# Patient Record
Sex: Female | Born: 1975
Health system: Southern US, Community
[De-identification: ages and names within clinical notes are randomized; demographics above are authoritative.]

## PROBLEM LIST (undated history)

## (undated) DIAGNOSIS — J40 Bronchitis, not specified as acute or chronic: Secondary | ICD-10-CM

## (undated) DIAGNOSIS — Z8619 Personal history of other infectious and parasitic diseases: Secondary | ICD-10-CM

## (undated) DIAGNOSIS — D249 Benign neoplasm of unspecified breast: Secondary | ICD-10-CM

## (undated) DIAGNOSIS — N649 Disorder of breast, unspecified: Secondary | ICD-10-CM

## (undated) DIAGNOSIS — M1990 Unspecified osteoarthritis, unspecified site: Secondary | ICD-10-CM

## (undated) DIAGNOSIS — T7840XA Allergy, unspecified, initial encounter: Secondary | ICD-10-CM

## (undated) DIAGNOSIS — D219 Benign neoplasm of connective and other soft tissue, unspecified: Secondary | ICD-10-CM

## (undated) DIAGNOSIS — D649 Anemia, unspecified: Secondary | ICD-10-CM

## (undated) HISTORY — PX: BUNIONECTOMY: SHX129

## (undated) HISTORY — PX: TUBAL LIGATION: SHX77

## (undated) HISTORY — DX: Allergy, unspecified, initial encounter: T78.40XA

## (undated) HISTORY — DX: Anemia, unspecified: D64.9

## (undated) HISTORY — DX: Unspecified osteoarthritis, unspecified site: M19.90

## (undated) HISTORY — PX: DENTAL SURGERY: SHX609

---

## 1997-03-07 ENCOUNTER — Inpatient Hospital Stay (HOSPITAL_COMMUNITY): Admission: AD | Admit: 1997-03-07 | Discharge: 1997-03-07 | Payer: Self-pay | Admitting: Obstetrics & Gynecology

## 1997-03-28 ENCOUNTER — Inpatient Hospital Stay (HOSPITAL_COMMUNITY): Admission: AD | Admit: 1997-03-28 | Discharge: 1997-03-28 | Payer: Self-pay | Admitting: Obstetrics & Gynecology

## 1997-04-12 ENCOUNTER — Inpatient Hospital Stay (HOSPITAL_COMMUNITY): Admission: AD | Admit: 1997-04-12 | Discharge: 1997-04-12 | Payer: Self-pay | Admitting: *Deleted

## 1997-04-16 ENCOUNTER — Inpatient Hospital Stay (HOSPITAL_COMMUNITY): Admission: AD | Admit: 1997-04-16 | Discharge: 1997-04-16 | Payer: Self-pay | Admitting: Obstetrics

## 1997-04-22 ENCOUNTER — Other Ambulatory Visit: Admission: RE | Admit: 1997-04-22 | Discharge: 1997-04-22 | Payer: Self-pay | Admitting: Obstetrics

## 1997-05-04 ENCOUNTER — Inpatient Hospital Stay (HOSPITAL_COMMUNITY): Admission: AD | Admit: 1997-05-04 | Discharge: 1997-05-04 | Payer: Self-pay | Admitting: Obstetrics

## 1997-05-09 ENCOUNTER — Ambulatory Visit (HOSPITAL_COMMUNITY): Admission: RE | Admit: 1997-05-09 | Discharge: 1997-05-09 | Payer: Self-pay | Admitting: Obstetrics

## 1997-05-12 ENCOUNTER — Inpatient Hospital Stay (HOSPITAL_COMMUNITY): Admission: AD | Admit: 1997-05-12 | Discharge: 1997-05-12 | Payer: Self-pay | Admitting: Obstetrics

## 1997-05-13 ENCOUNTER — Inpatient Hospital Stay (HOSPITAL_COMMUNITY): Admission: AD | Admit: 1997-05-13 | Discharge: 1997-05-13 | Payer: Self-pay | Admitting: Obstetrics

## 1997-05-24 ENCOUNTER — Inpatient Hospital Stay (HOSPITAL_COMMUNITY): Admission: AD | Admit: 1997-05-24 | Discharge: 1997-05-24 | Payer: Self-pay | Admitting: Obstetrics

## 1997-06-07 ENCOUNTER — Inpatient Hospital Stay (HOSPITAL_COMMUNITY): Admission: AD | Admit: 1997-06-07 | Discharge: 1997-06-07 | Payer: Self-pay | Admitting: Obstetrics

## 1997-06-25 ENCOUNTER — Inpatient Hospital Stay (HOSPITAL_COMMUNITY): Admission: AD | Admit: 1997-06-25 | Discharge: 1997-06-25 | Payer: Self-pay | Admitting: Obstetrics

## 1997-07-02 ENCOUNTER — Inpatient Hospital Stay (HOSPITAL_COMMUNITY): Admission: AD | Admit: 1997-07-02 | Discharge: 1997-07-02 | Payer: Self-pay | Admitting: Obstetrics

## 1997-07-23 ENCOUNTER — Inpatient Hospital Stay (HOSPITAL_COMMUNITY): Admission: AD | Admit: 1997-07-23 | Discharge: 1997-07-23 | Payer: Self-pay | Admitting: Obstetrics

## 1997-07-26 ENCOUNTER — Inpatient Hospital Stay (HOSPITAL_COMMUNITY): Admission: AD | Admit: 1997-07-26 | Discharge: 1997-07-26 | Payer: Self-pay | Admitting: Obstetrics

## 1997-08-20 ENCOUNTER — Ambulatory Visit (HOSPITAL_COMMUNITY): Admission: RE | Admit: 1997-08-20 | Discharge: 1997-08-20 | Payer: Self-pay | Admitting: Obstetrics

## 1997-08-20 ENCOUNTER — Inpatient Hospital Stay (HOSPITAL_COMMUNITY): Admission: AD | Admit: 1997-08-20 | Discharge: 1997-08-20 | Payer: Self-pay | Admitting: Obstetrics

## 1997-08-27 ENCOUNTER — Inpatient Hospital Stay (HOSPITAL_COMMUNITY): Admission: AD | Admit: 1997-08-27 | Discharge: 1997-08-27 | Payer: Self-pay | Admitting: Obstetrics

## 1997-09-28 ENCOUNTER — Inpatient Hospital Stay (HOSPITAL_COMMUNITY): Admission: AD | Admit: 1997-09-28 | Discharge: 1997-09-28 | Payer: Self-pay | Admitting: Obstetrics

## 1997-10-05 ENCOUNTER — Inpatient Hospital Stay (HOSPITAL_COMMUNITY): Admission: AD | Admit: 1997-10-05 | Discharge: 1997-10-05 | Payer: Self-pay | Admitting: *Deleted

## 1997-10-06 ENCOUNTER — Inpatient Hospital Stay (HOSPITAL_COMMUNITY): Admission: AD | Admit: 1997-10-06 | Discharge: 1997-10-06 | Payer: Self-pay | Admitting: Obstetrics

## 1997-11-10 ENCOUNTER — Inpatient Hospital Stay (HOSPITAL_COMMUNITY): Admission: AD | Admit: 1997-11-10 | Discharge: 1997-11-10 | Payer: Self-pay | Admitting: Obstetrics

## 1997-11-23 ENCOUNTER — Inpatient Hospital Stay (HOSPITAL_COMMUNITY): Admission: AD | Admit: 1997-11-23 | Discharge: 1997-11-23 | Payer: Self-pay | Admitting: Obstetrics

## 1997-11-24 ENCOUNTER — Inpatient Hospital Stay (HOSPITAL_COMMUNITY): Admission: AD | Admit: 1997-11-24 | Discharge: 1997-11-26 | Payer: Self-pay | Admitting: Obstetrics

## 1998-03-11 ENCOUNTER — Inpatient Hospital Stay (HOSPITAL_COMMUNITY): Admission: AD | Admit: 1998-03-11 | Discharge: 1998-03-11 | Payer: Self-pay | Admitting: *Deleted

## 1998-05-22 ENCOUNTER — Emergency Department (HOSPITAL_COMMUNITY): Admission: EM | Admit: 1998-05-22 | Discharge: 1998-05-22 | Payer: Self-pay | Admitting: Family Medicine

## 1998-07-16 ENCOUNTER — Emergency Department (HOSPITAL_COMMUNITY): Admission: EM | Admit: 1998-07-16 | Discharge: 1998-07-16 | Payer: Self-pay | Admitting: Emergency Medicine

## 1998-09-27 ENCOUNTER — Emergency Department (HOSPITAL_COMMUNITY): Admission: EM | Admit: 1998-09-27 | Discharge: 1998-09-27 | Payer: Self-pay | Admitting: Emergency Medicine

## 1999-02-21 ENCOUNTER — Inpatient Hospital Stay (HOSPITAL_COMMUNITY): Admission: AD | Admit: 1999-02-21 | Discharge: 1999-02-21 | Payer: Self-pay | Admitting: Obstetrics

## 1999-02-21 ENCOUNTER — Encounter: Payer: Self-pay | Admitting: Obstetrics

## 1999-06-10 ENCOUNTER — Encounter: Admission: RE | Admit: 1999-06-10 | Discharge: 1999-06-10 | Payer: Self-pay | Admitting: Hematology and Oncology

## 1999-06-19 ENCOUNTER — Inpatient Hospital Stay (HOSPITAL_COMMUNITY): Admission: AD | Admit: 1999-06-19 | Discharge: 1999-06-19 | Payer: Self-pay | Admitting: Obstetrics

## 1999-07-13 ENCOUNTER — Encounter: Admission: RE | Admit: 1999-07-13 | Discharge: 1999-07-13 | Payer: Self-pay | Admitting: Internal Medicine

## 1999-10-05 ENCOUNTER — Inpatient Hospital Stay (HOSPITAL_COMMUNITY): Admission: EM | Admit: 1999-10-05 | Discharge: 1999-10-05 | Payer: Self-pay | Admitting: *Deleted

## 1999-11-06 ENCOUNTER — Encounter: Admission: RE | Admit: 1999-11-06 | Discharge: 1999-11-06 | Payer: Self-pay | Admitting: Internal Medicine

## 1999-11-07 ENCOUNTER — Inpatient Hospital Stay (HOSPITAL_COMMUNITY): Admission: AD | Admit: 1999-11-07 | Discharge: 1999-11-07 | Payer: Self-pay | Admitting: *Deleted

## 1999-11-12 ENCOUNTER — Encounter: Admission: RE | Admit: 1999-11-12 | Discharge: 1999-11-12 | Payer: Self-pay | Admitting: Internal Medicine

## 1999-12-17 ENCOUNTER — Emergency Department (HOSPITAL_COMMUNITY): Admission: EM | Admit: 1999-12-17 | Discharge: 1999-12-17 | Payer: Self-pay | Admitting: Emergency Medicine

## 2000-01-08 ENCOUNTER — Encounter: Payer: Self-pay | Admitting: Internal Medicine

## 2000-01-08 ENCOUNTER — Encounter: Admission: RE | Admit: 2000-01-08 | Discharge: 2000-01-08 | Payer: Self-pay | Admitting: Internal Medicine

## 2000-01-08 ENCOUNTER — Ambulatory Visit (HOSPITAL_COMMUNITY): Admission: RE | Admit: 2000-01-08 | Discharge: 2000-01-08 | Payer: Self-pay | Admitting: Internal Medicine

## 2000-01-22 ENCOUNTER — Encounter: Admission: RE | Admit: 2000-01-22 | Discharge: 2000-01-22 | Payer: Self-pay | Admitting: Internal Medicine

## 2000-03-31 ENCOUNTER — Inpatient Hospital Stay (HOSPITAL_COMMUNITY): Admission: AD | Admit: 2000-03-31 | Discharge: 2000-03-31 | Payer: Self-pay | Admitting: Obstetrics & Gynecology

## 2000-05-12 ENCOUNTER — Encounter: Admission: RE | Admit: 2000-05-12 | Discharge: 2000-05-12 | Payer: Self-pay | Admitting: Hematology and Oncology

## 2000-07-08 ENCOUNTER — Encounter: Admission: RE | Admit: 2000-07-08 | Discharge: 2000-07-08 | Payer: Self-pay | Admitting: Internal Medicine

## 2000-07-12 ENCOUNTER — Emergency Department (HOSPITAL_COMMUNITY): Admission: EM | Admit: 2000-07-12 | Discharge: 2000-07-12 | Payer: Self-pay | Admitting: Emergency Medicine

## 2000-07-21 ENCOUNTER — Other Ambulatory Visit: Admission: RE | Admit: 2000-07-21 | Discharge: 2000-07-21 | Payer: Self-pay | Admitting: Obstetrics

## 2000-07-21 ENCOUNTER — Encounter: Admission: RE | Admit: 2000-07-21 | Discharge: 2000-07-21 | Payer: Self-pay | Admitting: Internal Medicine

## 2000-08-31 ENCOUNTER — Encounter: Payer: Self-pay | Admitting: Obstetrics

## 2000-08-31 ENCOUNTER — Inpatient Hospital Stay (HOSPITAL_COMMUNITY): Admission: AD | Admit: 2000-08-31 | Discharge: 2000-08-31 | Payer: Self-pay | Admitting: Obstetrics

## 2000-09-20 ENCOUNTER — Inpatient Hospital Stay (HOSPITAL_COMMUNITY): Admission: AD | Admit: 2000-09-20 | Discharge: 2000-09-20 | Payer: Self-pay | Admitting: *Deleted

## 2000-09-21 ENCOUNTER — Emergency Department (HOSPITAL_COMMUNITY): Admission: EM | Admit: 2000-09-21 | Discharge: 2000-09-21 | Payer: Self-pay | Admitting: Emergency Medicine

## 2000-09-28 ENCOUNTER — Inpatient Hospital Stay (HOSPITAL_COMMUNITY): Admission: AD | Admit: 2000-09-28 | Discharge: 2000-09-28 | Payer: Self-pay | Admitting: Obstetrics

## 2000-09-30 ENCOUNTER — Encounter: Admission: RE | Admit: 2000-09-30 | Discharge: 2000-09-30 | Payer: Self-pay | Admitting: Internal Medicine

## 2000-10-28 ENCOUNTER — Emergency Department (HOSPITAL_COMMUNITY): Admission: EM | Admit: 2000-10-28 | Discharge: 2000-10-28 | Payer: Self-pay | Admitting: Emergency Medicine

## 2000-11-01 ENCOUNTER — Encounter: Admission: RE | Admit: 2000-11-01 | Discharge: 2000-11-01 | Payer: Self-pay | Admitting: Internal Medicine

## 2000-11-18 ENCOUNTER — Encounter: Admission: RE | Admit: 2000-11-18 | Discharge: 2000-11-18 | Payer: Self-pay | Admitting: Internal Medicine

## 2000-12-21 ENCOUNTER — Encounter: Admission: RE | Admit: 2000-12-21 | Discharge: 2000-12-21 | Payer: Self-pay

## 2001-01-06 ENCOUNTER — Inpatient Hospital Stay (HOSPITAL_COMMUNITY): Admission: AD | Admit: 2001-01-06 | Discharge: 2001-01-06 | Payer: Self-pay | Admitting: Obstetrics & Gynecology

## 2001-03-30 ENCOUNTER — Emergency Department (HOSPITAL_COMMUNITY): Admission: EM | Admit: 2001-03-30 | Discharge: 2001-03-30 | Payer: Self-pay | Admitting: Emergency Medicine

## 2001-04-13 ENCOUNTER — Emergency Department (HOSPITAL_COMMUNITY): Admission: EM | Admit: 2001-04-13 | Discharge: 2001-04-13 | Payer: Self-pay | Admitting: Emergency Medicine

## 2001-04-14 ENCOUNTER — Inpatient Hospital Stay (HOSPITAL_COMMUNITY): Admission: AD | Admit: 2001-04-14 | Discharge: 2001-04-14 | Payer: Self-pay | Admitting: *Deleted

## 2001-06-22 ENCOUNTER — Emergency Department (HOSPITAL_COMMUNITY): Admission: EM | Admit: 2001-06-22 | Discharge: 2001-06-22 | Payer: Self-pay | Admitting: *Deleted

## 2001-09-07 ENCOUNTER — Encounter: Admission: RE | Admit: 2001-09-07 | Discharge: 2001-09-07 | Payer: Self-pay | Admitting: Obstetrics and Gynecology

## 2001-09-07 ENCOUNTER — Other Ambulatory Visit: Admission: RE | Admit: 2001-09-07 | Discharge: 2001-09-07 | Payer: Self-pay | Admitting: Family Medicine

## 2001-11-03 ENCOUNTER — Ambulatory Visit (HOSPITAL_COMMUNITY): Admission: RE | Admit: 2001-11-03 | Discharge: 2001-11-03 | Payer: Self-pay | Admitting: Obstetrics

## 2001-11-03 ENCOUNTER — Encounter: Payer: Self-pay | Admitting: Obstetrics

## 2001-12-26 ENCOUNTER — Inpatient Hospital Stay (HOSPITAL_COMMUNITY): Admission: AD | Admit: 2001-12-26 | Discharge: 2001-12-26 | Payer: Self-pay | Admitting: *Deleted

## 2002-03-05 ENCOUNTER — Encounter: Admission: RE | Admit: 2002-03-05 | Discharge: 2002-03-05 | Payer: Self-pay | Admitting: Family Medicine

## 2002-03-05 ENCOUNTER — Encounter: Payer: Self-pay | Admitting: Family Medicine

## 2002-04-06 ENCOUNTER — Inpatient Hospital Stay (HOSPITAL_COMMUNITY): Admission: AD | Admit: 2002-04-06 | Discharge: 2002-04-06 | Payer: Self-pay | Admitting: Obstetrics & Gynecology

## 2002-04-07 ENCOUNTER — Inpatient Hospital Stay (HOSPITAL_COMMUNITY): Admission: AD | Admit: 2002-04-07 | Discharge: 2002-04-07 | Payer: Self-pay | Admitting: Obstetrics

## 2002-05-07 ENCOUNTER — Ambulatory Visit (HOSPITAL_COMMUNITY): Admission: RE | Admit: 2002-05-07 | Discharge: 2002-05-07 | Payer: Self-pay | Admitting: Obstetrics

## 2002-05-07 ENCOUNTER — Encounter: Payer: Self-pay | Admitting: Obstetrics

## 2002-06-07 ENCOUNTER — Inpatient Hospital Stay (HOSPITAL_COMMUNITY): Admission: AD | Admit: 2002-06-07 | Discharge: 2002-06-07 | Payer: Self-pay | Admitting: Obstetrics & Gynecology

## 2002-06-15 ENCOUNTER — Inpatient Hospital Stay (HOSPITAL_COMMUNITY): Admission: AD | Admit: 2002-06-15 | Discharge: 2002-06-15 | Payer: Self-pay | Admitting: Obstetrics

## 2002-07-02 ENCOUNTER — Inpatient Hospital Stay (HOSPITAL_COMMUNITY): Admission: AD | Admit: 2002-07-02 | Discharge: 2002-07-02 | Payer: Self-pay | Admitting: Obstetrics & Gynecology

## 2002-08-30 ENCOUNTER — Encounter (INDEPENDENT_AMBULATORY_CARE_PROVIDER_SITE_OTHER): Payer: Self-pay | Admitting: Specialist

## 2002-08-30 ENCOUNTER — Ambulatory Visit (HOSPITAL_COMMUNITY): Admission: RE | Admit: 2002-08-30 | Discharge: 2002-08-30 | Payer: Self-pay | Admitting: Obstetrics & Gynecology

## 2002-09-03 ENCOUNTER — Inpatient Hospital Stay (HOSPITAL_COMMUNITY): Admission: AD | Admit: 2002-09-03 | Discharge: 2002-09-03 | Payer: Self-pay | Admitting: Obstetrics & Gynecology

## 2002-09-15 ENCOUNTER — Inpatient Hospital Stay (HOSPITAL_COMMUNITY): Admission: AD | Admit: 2002-09-15 | Discharge: 2002-09-15 | Payer: Self-pay | Admitting: Obstetrics

## 2002-10-15 ENCOUNTER — Inpatient Hospital Stay (HOSPITAL_COMMUNITY): Admission: AD | Admit: 2002-10-15 | Discharge: 2002-10-15 | Payer: Self-pay | Admitting: Obstetrics

## 2002-12-05 ENCOUNTER — Emergency Department (HOSPITAL_COMMUNITY): Admission: EM | Admit: 2002-12-05 | Discharge: 2002-12-05 | Payer: Self-pay | Admitting: Emergency Medicine

## 2003-02-13 ENCOUNTER — Inpatient Hospital Stay (HOSPITAL_COMMUNITY): Admission: AD | Admit: 2003-02-13 | Discharge: 2003-02-13 | Payer: Self-pay | Admitting: Obstetrics and Gynecology

## 2003-02-26 ENCOUNTER — Emergency Department (HOSPITAL_COMMUNITY): Admission: EM | Admit: 2003-02-26 | Discharge: 2003-02-26 | Payer: Self-pay | Admitting: Emergency Medicine

## 2003-06-20 ENCOUNTER — Inpatient Hospital Stay (HOSPITAL_COMMUNITY): Admission: AD | Admit: 2003-06-20 | Discharge: 2003-06-20 | Payer: Self-pay | Admitting: Obstetrics & Gynecology

## 2003-11-05 ENCOUNTER — Inpatient Hospital Stay (HOSPITAL_COMMUNITY): Admission: AD | Admit: 2003-11-05 | Discharge: 2003-11-05 | Payer: Self-pay | Admitting: Obstetrics

## 2003-11-11 ENCOUNTER — Emergency Department (HOSPITAL_COMMUNITY): Admission: EM | Admit: 2003-11-11 | Discharge: 2003-11-11 | Payer: Self-pay | Admitting: Family Medicine

## 2003-11-14 ENCOUNTER — Emergency Department (HOSPITAL_COMMUNITY): Admission: EM | Admit: 2003-11-14 | Discharge: 2003-11-14 | Payer: Self-pay | Admitting: Emergency Medicine

## 2003-11-29 ENCOUNTER — Inpatient Hospital Stay (HOSPITAL_COMMUNITY): Admission: AD | Admit: 2003-11-29 | Discharge: 2003-11-29 | Payer: Self-pay | Admitting: Obstetrics

## 2004-01-08 ENCOUNTER — Emergency Department (HOSPITAL_COMMUNITY): Admission: EM | Admit: 2004-01-08 | Discharge: 2004-01-08 | Payer: Self-pay

## 2004-02-06 ENCOUNTER — Inpatient Hospital Stay (HOSPITAL_COMMUNITY): Admission: AD | Admit: 2004-02-06 | Discharge: 2004-02-06 | Payer: Self-pay | Admitting: Obstetrics & Gynecology

## 2004-02-10 ENCOUNTER — Emergency Department (HOSPITAL_COMMUNITY): Admission: EM | Admit: 2004-02-10 | Discharge: 2004-02-10 | Payer: Self-pay | Admitting: Emergency Medicine

## 2004-02-18 ENCOUNTER — Ambulatory Visit (HOSPITAL_COMMUNITY): Admission: RE | Admit: 2004-02-18 | Discharge: 2004-02-18 | Payer: Self-pay | Admitting: Obstetrics & Gynecology

## 2004-03-23 ENCOUNTER — Emergency Department (HOSPITAL_COMMUNITY): Admission: EM | Admit: 2004-03-23 | Discharge: 2004-03-23 | Payer: Self-pay | Admitting: Emergency Medicine

## 2004-04-25 ENCOUNTER — Inpatient Hospital Stay (HOSPITAL_COMMUNITY): Admission: AD | Admit: 2004-04-25 | Discharge: 2004-04-25 | Payer: Self-pay | Admitting: Obstetrics & Gynecology

## 2004-04-27 ENCOUNTER — Emergency Department (HOSPITAL_COMMUNITY): Admission: EM | Admit: 2004-04-27 | Discharge: 2004-04-27 | Payer: Self-pay | Admitting: Emergency Medicine

## 2004-05-02 ENCOUNTER — Inpatient Hospital Stay (HOSPITAL_COMMUNITY): Admission: AD | Admit: 2004-05-02 | Discharge: 2004-05-02 | Payer: Self-pay | Admitting: Obstetrics & Gynecology

## 2004-06-14 ENCOUNTER — Emergency Department (HOSPITAL_COMMUNITY): Admission: EM | Admit: 2004-06-14 | Discharge: 2004-06-14 | Payer: Self-pay | Admitting: Emergency Medicine

## 2004-07-28 ENCOUNTER — Inpatient Hospital Stay (HOSPITAL_COMMUNITY): Admission: AD | Admit: 2004-07-28 | Discharge: 2004-07-28 | Payer: Self-pay | Admitting: Obstetrics

## 2004-10-02 ENCOUNTER — Emergency Department (HOSPITAL_COMMUNITY): Admission: EM | Admit: 2004-10-02 | Discharge: 2004-10-02 | Payer: Self-pay | Admitting: Emergency Medicine

## 2004-10-21 ENCOUNTER — Emergency Department (HOSPITAL_COMMUNITY): Admission: EM | Admit: 2004-10-21 | Discharge: 2004-10-21 | Payer: Self-pay | Admitting: Emergency Medicine

## 2005-02-13 ENCOUNTER — Inpatient Hospital Stay (HOSPITAL_COMMUNITY): Admission: AD | Admit: 2005-02-13 | Discharge: 2005-02-13 | Payer: Self-pay | Admitting: Obstetrics & Gynecology

## 2005-05-02 ENCOUNTER — Emergency Department (HOSPITAL_COMMUNITY): Admission: EM | Admit: 2005-05-02 | Discharge: 2005-05-03 | Payer: Self-pay | Admitting: Emergency Medicine

## 2005-05-04 ENCOUNTER — Emergency Department (HOSPITAL_COMMUNITY): Admission: EM | Admit: 2005-05-04 | Discharge: 2005-05-04 | Payer: Self-pay | Admitting: Family Medicine

## 2005-07-15 ENCOUNTER — Inpatient Hospital Stay (HOSPITAL_COMMUNITY): Admission: AD | Admit: 2005-07-15 | Discharge: 2005-07-15 | Payer: Self-pay | Admitting: Obstetrics & Gynecology

## 2005-10-12 ENCOUNTER — Inpatient Hospital Stay (HOSPITAL_COMMUNITY): Admission: AD | Admit: 2005-10-12 | Discharge: 2005-10-12 | Payer: Self-pay | Admitting: Obstetrics

## 2005-10-20 ENCOUNTER — Inpatient Hospital Stay (HOSPITAL_COMMUNITY): Admission: AD | Admit: 2005-10-20 | Discharge: 2005-10-20 | Payer: Self-pay | Admitting: Obstetrics

## 2006-01-09 ENCOUNTER — Emergency Department (HOSPITAL_COMMUNITY): Admission: AD | Admit: 2006-01-09 | Discharge: 2006-01-09 | Payer: Self-pay | Admitting: Family Medicine

## 2006-03-16 ENCOUNTER — Emergency Department (HOSPITAL_COMMUNITY): Admission: EM | Admit: 2006-03-16 | Discharge: 2006-03-16 | Payer: Self-pay | Admitting: Emergency Medicine

## 2006-04-26 ENCOUNTER — Inpatient Hospital Stay (HOSPITAL_COMMUNITY): Admission: AD | Admit: 2006-04-26 | Discharge: 2006-04-26 | Payer: Self-pay | Admitting: Obstetrics

## 2006-08-04 ENCOUNTER — Other Ambulatory Visit: Admission: RE | Admit: 2006-08-04 | Discharge: 2006-08-04 | Payer: Self-pay | Admitting: Gynecology

## 2006-08-29 ENCOUNTER — Emergency Department (HOSPITAL_COMMUNITY): Admission: EM | Admit: 2006-08-29 | Discharge: 2006-08-29 | Payer: Self-pay | Admitting: Emergency Medicine

## 2006-09-17 ENCOUNTER — Inpatient Hospital Stay (HOSPITAL_COMMUNITY): Admission: AD | Admit: 2006-09-17 | Discharge: 2006-09-17 | Payer: Self-pay | Admitting: Gynecology

## 2007-01-11 ENCOUNTER — Inpatient Hospital Stay (HOSPITAL_COMMUNITY): Admission: AD | Admit: 2007-01-11 | Discharge: 2007-01-11 | Payer: Self-pay | Admitting: Gynecology

## 2007-05-27 ENCOUNTER — Emergency Department (HOSPITAL_COMMUNITY): Admission: EM | Admit: 2007-05-27 | Discharge: 2007-05-27 | Payer: Self-pay | Admitting: Emergency Medicine

## 2007-07-24 ENCOUNTER — Inpatient Hospital Stay (HOSPITAL_COMMUNITY): Admission: AD | Admit: 2007-07-24 | Discharge: 2007-07-24 | Payer: Self-pay | Admitting: Gynecology

## 2007-10-01 ENCOUNTER — Inpatient Hospital Stay (HOSPITAL_COMMUNITY): Admission: AD | Admit: 2007-10-01 | Discharge: 2007-10-01 | Payer: Self-pay | Admitting: Gynecology

## 2008-01-31 ENCOUNTER — Ambulatory Visit: Payer: Self-pay | Admitting: Gynecology

## 2008-02-09 ENCOUNTER — Ambulatory Visit: Payer: Self-pay | Admitting: Gynecology

## 2008-06-07 ENCOUNTER — Emergency Department (HOSPITAL_COMMUNITY): Admission: EM | Admit: 2008-06-07 | Discharge: 2008-06-07 | Payer: Self-pay | Admitting: Family Medicine

## 2008-07-02 ENCOUNTER — Emergency Department (HOSPITAL_COMMUNITY): Admission: EM | Admit: 2008-07-02 | Discharge: 2008-07-02 | Payer: Self-pay | Admitting: Emergency Medicine

## 2008-07-06 ENCOUNTER — Emergency Department (HOSPITAL_COMMUNITY): Admission: EM | Admit: 2008-07-06 | Discharge: 2008-07-06 | Payer: Self-pay | Admitting: Emergency Medicine

## 2008-08-25 ENCOUNTER — Inpatient Hospital Stay (HOSPITAL_COMMUNITY): Admission: AD | Admit: 2008-08-25 | Discharge: 2008-08-25 | Payer: Self-pay | Admitting: Gynecology

## 2008-09-06 ENCOUNTER — Encounter: Admission: RE | Admit: 2008-09-06 | Discharge: 2008-09-06 | Payer: Self-pay | Admitting: Gastroenterology

## 2008-09-16 ENCOUNTER — Ambulatory Visit (HOSPITAL_COMMUNITY): Admission: RE | Admit: 2008-09-16 | Discharge: 2008-09-16 | Payer: Self-pay | Admitting: Gastroenterology

## 2009-02-24 ENCOUNTER — Emergency Department (HOSPITAL_COMMUNITY): Admission: EM | Admit: 2009-02-24 | Discharge: 2009-02-24 | Payer: Self-pay | Admitting: Emergency Medicine

## 2009-03-23 ENCOUNTER — Emergency Department (HOSPITAL_COMMUNITY): Admission: EM | Admit: 2009-03-23 | Discharge: 2009-03-23 | Payer: Self-pay | Admitting: Family Medicine

## 2009-05-10 ENCOUNTER — Inpatient Hospital Stay (HOSPITAL_COMMUNITY): Admission: AD | Admit: 2009-05-10 | Discharge: 2009-05-10 | Payer: Self-pay | Admitting: Obstetrics and Gynecology

## 2009-06-22 ENCOUNTER — Emergency Department (HOSPITAL_COMMUNITY): Admission: EM | Admit: 2009-06-22 | Discharge: 2009-06-22 | Payer: Self-pay | Admitting: Family Medicine

## 2009-09-16 ENCOUNTER — Emergency Department (HOSPITAL_COMMUNITY): Admission: EM | Admit: 2009-09-16 | Discharge: 2009-09-16 | Payer: Self-pay | Admitting: Family Medicine

## 2010-02-21 ENCOUNTER — Emergency Department (HOSPITAL_COMMUNITY)
Admission: EM | Admit: 2010-02-21 | Discharge: 2010-02-21 | Payer: Self-pay | Source: Home / Self Care | Admitting: Family Medicine

## 2010-04-22 LAB — URINALYSIS, ROUTINE W REFLEX MICROSCOPIC
Bilirubin Urine: NEGATIVE
Glucose, UA: NEGATIVE mg/dL
Ketones, ur: NEGATIVE mg/dL
Nitrite: POSITIVE — AB
Specific Gravity, Urine: 1.015 (ref 1.005–1.030)
pH: 6 (ref 5.0–8.0)

## 2010-04-22 LAB — URINE MICROSCOPIC-ADD ON

## 2010-04-22 LAB — POCT PREGNANCY, URINE: Preg Test, Ur: NEGATIVE

## 2010-05-10 LAB — URINE CULTURE: Colony Count: >=100000

## 2010-05-10 LAB — GC/CHLAMYDIA PROBE AMP, GENITAL: GC Probe Amp, Genital: NEGATIVE

## 2010-05-10 LAB — URINE MICROSCOPIC-ADD ON

## 2010-05-10 LAB — URINALYSIS, ROUTINE W REFLEX MICROSCOPIC
Glucose, UA: NEGATIVE mg/dL
Ketones, ur: NEGATIVE mg/dL
Leukocytes, UA: NEGATIVE
Nitrite: NEGATIVE
Protein, ur: >300 mg/dL — AB
pH: 6 (ref 5.0–8.0)

## 2010-05-10 LAB — WET PREP, GENITAL

## 2010-06-16 NOTE — Consult Note (Signed)
Julia Shaw, Julia Shaw          ACCOUNT NO.:  [id number]   MEDICAL RECORD NO.:  [id number]          PATIENT TYPE:  MAT   LOCATION:  [facility]                          [facility]ILITY:  [facility]   PHYSICIAN:  M. Leda Quail, MD  DATE OF BIRTH:  1975-06-11   DATE OF CONSULTATION:  DATE OF DISCHARGE:                                 CONSULTATION   CHIEF COMPLAINT:  Female problems.   HISTORY OF PRESENT ILLNESS:  The patient is a 35 year old G3, P2, A1,  single, African American female presents with a 2-day history of vaginal  discharge with some odor.  She has some mild itching.  She has had  something like this before which was a yeast infection.  She is not sure  if this is the same thing or different.  She had not called Dr. Audie Box  this week at all.  She is having no urinary symptoms.  No fever.  No  back pain.  No nausea, vomiting, diarrhea, or constipation.   PAST MEDICAL HISTORY:  Small fibroid uterus.   PAST SURGICAL HISTORY:  1. Laparoscopic left salpingectomy done by Dr. Roseanna Rainbow      for what sounds like hydrosalpinx 1-2 years ago.  2. D&C x1 for a missed AB.  3. Bilateral tubal ligation.   GYNECOLOGIC HISTORY:  She has slightly irregular cycles.  They do occur  monthly but she does have some irregularity to her bleeding where she  will bleed for a few days, stop, and ten have some brownish discharge  for several more days.  She has seen Dr. Audie Box for this and undergone  an ultrasound for workup.  She has not decided what she wants to do for  treatment.   ALLERGIES:  No known drug allergies.   SOCIAL HISTORY:  Single.  She lives with her kids who are with her  grandmother at the moment.  She works at [employer].  She has occasional  alcohol use but she denies any drugs or alcohol.   REVIEW OF SYSTEMS:  Pertinent positives are as per above.  The patient  states that she is not sexually active, although she did tell the nurse  she had been sexually active about a  week ago.   PHYSICAL EXAMINATION:  VITAL SIGNS:  Temp 98.4, BP 101/66, pulse 85,  respirations 16.  GENERAL:  She is a slightly obese [place] American female who is well  nourished and well developed in no acute distress.  CARDIOVASCULAR:  Regular rate and rhythm without murmurs, rubs, or  gallops.  LUNGS:  Clear to auscultation bilaterally with good respiratory effort.  FLANK:  No CVA tenderness.  ABDOMEN:  Soft, nontender, nondistended.  She does have a protuberant  abdomen.  Normal bowel sounds.  No masses, hernias, or  hepatosplenomegaly.  She has no guarding or rebound.  LYMPH:  No inguinal adenopathy.  GYN:  Normal-appearing external female genitalia.  She has got some  frothy discharge present.  The vaginal tissue is slightly erythematous.  The cervix is parous.  She does have a secondary cystocele.  The urethra  meatus are otherwise normal.  Bladder is  nontender to palpation.  A wet  smear is obtained and GC and Chlamydia cultures are obtained.  On  bimanual exam she has an anteflexed uterus.  There is no cervical motion  tenderness.  The adnexa are not palpable due to her abdomen.  EXTREMITIES:  No clubbing, cyanosis, or edema.  She does have some  scarring on her left arm which she says is burns from work.   ASSESSMENT/PLAN:  43. A 35 year old gravida 3, para 2, abortion 1, African American      female with vaginal discharge most consistent with BV or      Trichomonas.  She will be treated pending her wet smear.  2. GC and Chlamydia cultures will be followed.  3. She should follow up with Dr. Audie Box next week if she has any      continued problems.      Lum Keas, MD  Electronically Signed     MSM/MEDQ  D:  09/17/2006  T:  09/18/2006  Job:  [id number]   cc:   Marcial Pacas P. Fontaine, M.D.  Fax: [phone]

## 2010-06-19 NOTE — H&P (Signed)
NAME:  Julia Shaw, HECKART NO.:  [id number]   MEDICAL RECORD NO.:  [id number]                   PATIENT TYPE:  MAT   LOCATION:  [facility]                                 [facility]   PHYSICIAN:  Roseanna Rainbow, M.D.         DATE OF BIRTH:  1975/06/04   DATE OF ADMISSION:  07/02/2002  DATE OF DISCHARGE:  07/02/2002                                HISTORY & PHYSICAL   CHIEF COMPLAINT:  The patient is a 35 year old, para 2, African American  female with a likely left sided hydrosalpinx and pain who presents for  operative laparoscopy with left salpingectomy.   HISTORY OF PRESENT ILLNESS:  As above.  The patient has a long history of  pelvic pain with what she describes as lower abdominal suprapubic cramping  primarily cyclic in nature but occasionally she has the pain unassociated  with menses.  Serial ultrasounds for the past two years have demonstrated a  stable left sided hydrosalpinx that is approximately 6 cm in diameter.   PAST OB/GYN HISTORY:  1. She has been pregnant three times, has had two vaginal deliveries and one     miscarriage.  2. She is also status post a bilateral tubal ligation.  3. She denies any history of any sexually transmitted diseases.  4. Pap smear from February demonstrated atypical cells and atypical squamous     cells and high risk HPV was positive.   PAST MEDICAL HISTORY:  She denies.   PAST SURGICAL HISTORY:  As above.   MEDICATIONS:  Include Ultram.   ALLERGIES:  No known drug allergies.   SOCIAL HISTORY:  She is single.  She denies any tobacco, ethanol or  substance abuse.   FAMILY HISTORY:  Remarkable for diabetes.   PHYSICAL EXAMINATION:  VITAL SIGNS:  Temperature is 97.5, pulse 88, blood  pressure 115/70, weight 174 pounds, height 5 foot 3 inches.  GENERAL:  Slightly overweight.  No apparent distress.  LUNGS:  Clear to auscultation bilaterally.  HEART:  Regular rate and rhythm.  ABDOMEN:  Soft,  nontender.  PELVIC:  The external female genitalia normal appearing.  Speculum exam, the  vagina is clean.  Bimanual exam the uterus is retroverted,  nontender,  normal size.  There is a fullness in the left adnexa, nontender.  The right  adnexa is nontender.  No masses.  EXTREMITIES:  Lower extremities no clubbing, cyanosis or edema.  SKIN:  Without rashes.   ASSESSMENT:  Left hydrosalpinx with pelvic pain.   PLAN:  Operative laparoscopy with likely a left salpingectomy possible  laparotomy.  The risks, benefits and alternative forms of management were  reviewed with the patient and informed consent had been obtained.  Roseanna Rainbow, M.D.    Judee Clara  D:  07/06/2002  T:  07/06/2002  Job:  [id number]

## 2010-06-19 NOTE — Op Note (Signed)
Julia Shaw, Julia Shaw                      ACCOUNT NO.:  [id number]   MEDICAL RECORD NO.:  [id number]                   PATIENT TYPE:  AMB   LOCATION:  [facility]                                  [facility]   PHYSICIAN:  Roseanna Rainbow, M.D.         DATE OF BIRTH:  [birth year]   DATE OF PROCEDURE:  08/30/2002  DATE OF DISCHARGE:                                 OPERATIVE REPORT   PREOPERATIVE DIAGNOSIS:  Left hydrosalpinx.   POSTOPERATIVE DIAGNOSIS:  Left hydrosalpinx.   PROCEDURE:  Operative laparoscopy with left salpingectomy.   SURGEON:  Roseanna Rainbow, M.D.  Charles A. Clearance Coots, M.D.   ANESTHESIA:  General endotracheal anesthesia.   COMPLICATIONS:  None.   ESTIMATED BLOOD LOSS:  Less than 50 mL.   FLUIDS REPLACED:  As per anesthesiology.   URINE OUTPUT:  As per anesthesiology.   FINDINGS:  Normal uterus and ovaries.  The right fallopian tube distally  appeared normal.  The distal aspect of the left ovary was markedly dilated.   PROCEDURE:  The patient was taken to the operating room where general  anesthesia was obtained without difficulty.  She was then placed in the  dorsal lithotomy position and prepped and draped in the sterile fashion.  A  bivalve speculum was placed in the vagina and the anterior lip of the cervix  grasped with a single tooth tenaculum.  The Hulka uterine manipulator was  advanced into the uterus and secured to the anterior lip of the cervix as a  means to manipulate the uterus.  The specimen was removed from the vagina.   Attention was turned to the abdomen where a 10 mm skin incision was made in  the umbilical fold.  The Veress needle was carefully introduced into the  peritoneal cavity at a 45 degree angle while tenting the abdominal wall.  Intraperitoneal placement was confirmed by use of a water filled syringe and  a drop of intra-abdominal pressure with insufflation of CO2 gas.  The trocar  and sleeve were then advanced  without difficulty into the abdomen where  intra-abdominal placement was confirmed by the laparoscope.  A second  incision was made in the right lower quadrant and a second trocar and sleeve  were advanced under direct visualization.  A third incision was made in the  left lower quadrant and the third trocar and sleeve were advanced under  direct visualization.  A fourth skin incision was made approximately 2 cm  above the symphysis pubis in the midline and a fourth trocar and sleeve were  advanced under direct visualization.  Survey of the patient's pelvis and  abdomen revealed the above findings.   The portion of the tube proximal to the dilated portion was then cauterized  and severed.  The remaining mesosalpinx and fimbria were then cauterized and  severed with endoshears.  The tube was placed into an endocatch and removed  from the abdomen.  The suprapubic fascial incision  was then repaired with 0  Vicryl under direct visualization.  The instruments were removed from the  patient's abdomen and the incisions repaired with 4-0 Vicryl and Dermabond.  The Hulka manipulator was removed from the vagina with minimal bleeding  noted from the cervix.  The patient tolerated the procedure well.  Sponge,  lap, needle, and instrument counts were correct x 2.  The patient was taken  to the PACU in stable condition.                                               Roseanna Rainbow, M.D.    Judee Clara  D:  08/30/2002  T:  08/30/2002  Job:  [id number]

## 2010-06-19 NOTE — H&P (Signed)
NAME:  Julia Shaw, Julia Shaw NO.:  [id number]   MEDICAL RECORD NO.:  [id number]                   PATIENT TYPE:  AMB   LOCATION:  [facility]                                  [facility]   PHYSICIAN:  Roseanna Rainbow, M.D.         DATE OF BIRTH:  01-21-1976   DATE OF ADMISSION:  08/09/2002  DATE OF DISCHARGE:                                HISTORY & PHYSICAL   CHIEF COMPLAINT:  The patient is a 35 year old, para, 2 African American  female with likely left-sided hydrosalpinx and pain who presents for  operative laparoscopy with left salpingectomy.   HISTORY OF PRESENT ILLNESS:  As above. The patient has a long history of  pelvic pain, which she describes as lower abdominal in the suprapubic  region. She characterizes the pain as crampy and cyclic in nature.  Occasionally, she has pain unassociated with menses. Serial ultrasounds over  the past two years have demonstrated a stable left-sided hydrosalpinx that  is approximately 6 cm in diameter.   PAST OBSTETRIC/GYNECOLOGIC HISTORY:  She has been pregnant three times. She  had two vaginal deliveries and one miscarriage. She is status post bilateral  tubal ligation. She denies any history of any sexually transmitted diseases.  Pap smear from February demonstrated atypical squamous cells and there was  high risk HPV present.   PAST MEDICAL HISTORY:  She denies.   PAST SURGICAL HISTORY:  As above.   MEDICATIONS:  Ultram.   ALLERGIES:  No known drug allergies.   SOCIAL HISTORY:  She is single. She denies any tobacco, ethanol, or  substance abuse.   FAMILY HISTORY:  Remarkable for diabetes.   PHYSICAL EXAMINATION:  VITAL SIGNS:  Temperature 97.5, pulse 88, blood  pressure 115/70, weight 174 pounds, height 5 feet 3 inches.  GENERAL:  Slightly overweight. No apparent distress.  LUNGS:  Clear to auscultation bilaterally.  HEART:  Regular rate and rhythm.  ABDOMEN:  Soft, nontender.  PELVIC:  External  female genitalia are normal appearing. On speculum  examination, the vagina is clean.  On bimanual examination, the uterus is  retroverted, nontender, normal size. There is fullness in the left adnexa  which is nontender. The right adnexa is nontender and no masses.  EXTREMITIES:  Lower extremities show no clubbing, cyanosis, or edema.  SKIN:  Without rash.   ASSESSMENT:  Left hydrosalpinx with secondary pelvic pain.   PLAN:  Operative laparoscopy with likely left salpingectomy, possible left  ovarian cystectomy, possible laparotomy. The risks, benefits, and  alternative forms to management were reviewed with the patient and informed  consent has been obtained.                                               Roseanna Rainbow, M.D.    Julia Shaw  D:  08/09/2002  T:  08/09/2002  Job:  [id number]

## 2010-08-22 ENCOUNTER — Inpatient Hospital Stay (HOSPITAL_COMMUNITY)
Admission: AD | Admit: 2010-08-22 | Discharge: 2010-08-22 | Disposition: A | Discharge status: Home / Self Care | Payer: Medicare Other | Source: Ambulatory Visit | Attending: Obstetrics & Gynecology | Admitting: Obstetrics & Gynecology

## 2010-08-22 ENCOUNTER — Encounter (HOSPITAL_COMMUNITY): Payer: Self-pay | Admitting: *Deleted

## 2010-08-22 DIAGNOSIS — L293 Anogenital pruritus, unspecified: Secondary | ICD-10-CM | POA: Insufficient documentation

## 2010-08-22 DIAGNOSIS — A5901 Trichomonal vulvovaginitis: Secondary | ICD-10-CM

## 2010-08-22 LAB — WET PREP, GENITAL: Yeast Wet Prep HPF POC: NONE SEEN

## 2010-08-22 LAB — URINALYSIS, ROUTINE W REFLEX MICROSCOPIC
Nitrite: NEGATIVE
Protein, ur: NEGATIVE mg/dL
Specific Gravity, Urine: 1.01 (ref 1.005–1.030)
Urobilinogen, UA: 1 mg/dL (ref 0.0–1.0)

## 2010-08-22 LAB — URINE MICROSCOPIC-ADD ON

## 2010-08-22 LAB — POCT PREGNANCY, URINE: Preg Test, Ur: NEGATIVE

## 2010-08-22 MED ORDER — METRONIDAZOLE 500 MG PO TABS: 500.0000 mg | ORAL_TABLET | Freq: Two times a day (BID) | ORAL | Status: AC

## 2010-08-22 NOTE — Progress Notes (Signed)
Pt reports having onset of white itchy vaginal discharge on Wed. She denies abd pain, or dysuria. She has not used any over the counter meds.

## 2010-08-22 NOTE — ED Provider Notes (Signed)
History     Chief Complaint  Patient presents with  . Vaginal Discharge  . Vaginal Itching   HPI Comments: The patient had sex last week and the discharge started a couple days later. She states that her sex partner of 8 years went and got checked but didn't have anything but her discharge continues with itching and burning. She has had a BTL for birth control.   Patient is a 35 y.o. female presenting with vaginal discharge and vaginal itching. The history is provided by the patient.  Vaginal Discharge This is a new problem. The current episode started in the past 7 days. The problem occurs constantly. The problem has been unchanged.  Vaginal Itching    Past Medical History  Diagnosis Date  . Asthma     Past Surgical History  Procedure Date  . Tubal ligation     No family history on file.  History  Substance Use Topics  . Smoking status: Never Smoker   . Smokeless tobacco: Never Used  . Alcohol Use: No    OB History    Grav Para Term Preterm Abortions TAB SAB Ect Mult Living   3 2 2  1  1   2       Review of Systems  Genitourinary: Positive for vaginal discharge.  All other systems reviewed and are negative.    Physical Exam  BP 110/81  Pulse 99  Temp(Src) 99.1 F (37.3 C) (Oral)  Resp 16  Ht 5' (1.524 m)  Wt 193 lb 6.4 oz (87.726 kg)  BMI 37.77 kg/m2  LMP 08/08/2010  Physical Exam  Nursing note and vitals reviewed. Constitutional: She is oriented to person, place, and time. She appears well-developed and well-nourished.  HENT:  Head: Normocephalic.  Eyes: EOM are normal.  Neck: Neck supple.  Pulmonary/Chest: Effort normal.  Abdominal: Soft. There is no tenderness.  Genitourinary: Vaginal discharge found.       Frothy yellow vaginal discharge noted. Irritation of vaginal mucosa. Cervix inflamed. No CMT.  Musculoskeletal: Normal range of motion.  Neurological: She is alert and oriented to person, place, and time.  Skin: Skin is warm and dry.     ED Course  Procedures  MDM: trichomonas found in urine and wet prep.  Will treat with Flagyl. Discussed no ETOH use while taking medication. Discussed with patient importance of partner treatment even if he has no symptoms. They will follow up with [facility]D [facility]. Dx: Trichomonas Plan: flagyl 500mg . Po bid x 7 days. Follow up with STD clinic.

## 2010-08-22 NOTE — Progress Notes (Signed)
Onset of vaginal discharge white with an odor and itching x 1 week

## 2010-08-25 ENCOUNTER — Emergency Department (HOSPITAL_COMMUNITY)
Admission: EM | Admit: 2010-08-25 | Discharge: 2010-08-25 | Disposition: A | Discharge status: Home / Self Care | Payer: Medicare Other | Attending: Emergency Medicine | Admitting: Emergency Medicine

## 2010-08-25 DIAGNOSIS — N898 Other specified noninflammatory disorders of vagina: Secondary | ICD-10-CM | POA: Insufficient documentation

## 2010-08-25 DIAGNOSIS — J45909 Unspecified asthma, uncomplicated: Secondary | ICD-10-CM | POA: Insufficient documentation

## 2010-08-25 DIAGNOSIS — N72 Inflammatory disease of cervix uteri: Secondary | ICD-10-CM | POA: Insufficient documentation

## 2010-08-25 LAB — WET PREP, GENITAL: Clue Cells Wet Prep HPF POC: NONE SEEN

## 2010-08-25 LAB — URINALYSIS, ROUTINE W REFLEX MICROSCOPIC
Bilirubin Urine: NEGATIVE
Ketones, ur: NEGATIVE mg/dL
Nitrite: NEGATIVE
Protein, ur: NEGATIVE mg/dL
pH: 6 (ref 5.0–8.0)

## 2010-08-25 LAB — POCT PREGNANCY, URINE: Preg Test, Ur: NEGATIVE

## 2010-08-25 LAB — URINE MICROSCOPIC-ADD ON

## 2010-08-26 LAB — GC/CHLAMYDIA PROBE AMP, GENITAL
Chlamydia, DNA Probe: NEGATIVE
GC Probe Amp, Genital: NEGATIVE

## 2010-10-29 LAB — URINALYSIS, ROUTINE W REFLEX MICROSCOPIC
Ketones, ur: 15 — AB
Nitrite: NEGATIVE
Protein, ur: NEGATIVE
Urobilinogen, UA: 1
pH: 6

## 2010-10-29 LAB — WET PREP, GENITAL: Yeast Wet Prep HPF POC: NONE SEEN

## 2010-10-29 LAB — GC/CHLAMYDIA PROBE AMP, GENITAL: Chlamydia, DNA Probe: NEGATIVE

## 2010-11-09 LAB — POCT PREGNANCY, URINE
Operator id: 13344
Preg Test, Ur: NEGATIVE

## 2010-11-09 LAB — GC/CHLAMYDIA PROBE AMP, GENITAL: Chlamydia, DNA Probe: NEGATIVE

## 2010-11-13 LAB — POCT PREGNANCY, URINE: Operator id: 20265

## 2010-11-13 LAB — GC/CHLAMYDIA PROBE AMP, GENITAL: GC Probe Amp, Genital: NEGATIVE

## 2010-11-13 LAB — WET PREP, GENITAL: Yeast Wet Prep HPF POC: NONE SEEN

## 2010-11-24 ENCOUNTER — Other Ambulatory Visit: Payer: Self-pay | Admitting: Obstetrics and Gynecology

## 2011-01-06 ENCOUNTER — Emergency Department (HOSPITAL_COMMUNITY)
Admission: EM | Admit: 2011-01-06 | Discharge: 2011-01-06 | Disposition: A | Discharge status: Home / Self Care | Payer: Medicare Other | Attending: Emergency Medicine | Admitting: Emergency Medicine

## 2011-01-06 ENCOUNTER — Encounter (HOSPITAL_COMMUNITY): Payer: Self-pay | Admitting: *Deleted

## 2011-01-06 DIAGNOSIS — J45909 Unspecified asthma, uncomplicated: Secondary | ICD-10-CM | POA: Insufficient documentation

## 2011-01-06 DIAGNOSIS — L293 Anogenital pruritus, unspecified: Secondary | ICD-10-CM | POA: Insufficient documentation

## 2011-01-06 DIAGNOSIS — N949 Unspecified condition associated with female genital organs and menstrual cycle: Secondary | ICD-10-CM | POA: Insufficient documentation

## 2011-01-06 DIAGNOSIS — N898 Other specified noninflammatory disorders of vagina: Secondary | ICD-10-CM | POA: Insufficient documentation

## 2011-01-06 DIAGNOSIS — N72 Inflammatory disease of cervix uteri: Secondary | ICD-10-CM | POA: Insufficient documentation

## 2011-01-06 DIAGNOSIS — R21 Rash and other nonspecific skin eruption: Secondary | ICD-10-CM | POA: Insufficient documentation

## 2011-01-06 LAB — URINALYSIS, ROUTINE W REFLEX MICROSCOPIC
Bilirubin Urine: NEGATIVE
Nitrite: NEGATIVE
Protein, ur: NEGATIVE mg/dL
Specific Gravity, Urine: 1.014 (ref 1.005–1.030)
Urobilinogen, UA: 1 mg/dL (ref 0.0–1.0)

## 2011-01-06 LAB — WET PREP, GENITAL
Trich, Wet Prep: NONE SEEN
Yeast Wet Prep HPF POC: NONE SEEN

## 2011-01-06 LAB — URINE MICROSCOPIC-ADD ON

## 2011-01-06 LAB — PREGNANCY, URINE: Preg Test, Ur: NEGATIVE

## 2011-01-06 MED ORDER — CEFTRIAXONE SODIUM 250 MG IJ SOLR
250.0000 mg | INTRAMUSCULAR | Status: DC
  Filled 2011-01-06: qty 250

## 2011-01-06 MED ORDER — AZITHROMYCIN 250 MG PO TABS
1000.0000 mg | ORAL_TABLET | Freq: Once | ORAL | Status: DC
  Filled 2011-01-06: qty 4

## 2011-01-06 MED ORDER — LIDOCAINE HCL (PF) 1 % IJ SOLN
INTRAMUSCULAR | Status: AC
  Filled 2011-01-06: qty 5

## 2011-01-06 NOTE — ED Notes (Signed)
Reports vaginal itching and discharge x 2 days.

## 2011-01-06 NOTE — ED Provider Notes (Signed)
Medical screening examination/treatment/procedure(s) were performed by non-physician practitioner and as supervising physician I was immediately available for consultation/collaboration.  Flint Melter, MD 01/06/11 [phone]

## 2011-01-06 NOTE — ED Provider Notes (Signed)
History     CSN: [id number] Arrival date & time: 01/06/2011  7:51 AM   First MD Initiated Contact with Patient 01/06/11 0805      Chief Complaint  Patient presents with  . Vaginal Itching    (Consider location/radiation/quality/duration/timing/severity/associated sxs/prior treatment) Patient is a 35 y.o. female presenting with vaginal itching. The history is provided by the patient.  Vaginal Itching This is a new problem. The current episode started in the past 7 days. The problem occurs constantly. The problem has been unchanged. Associated symptoms include a rash. Pertinent negatives include no abdominal pain, change in bowel habit, chills, fever, nausea or urinary symptoms. The symptoms are aggravated by nothing. Treatments tried: summers eve wipes. The treatment provided no relief.  Pt reports discoloration of the skin around her anus. Also white vaginal discharge, itching, irritation. Admits to unprotected intercourse recently. Denies abdominal pain, pain with intercourse, nausea, vomiting, fever.   Past Medical History  Diagnosis Date  . Asthma     Past Surgical History  Procedure Date  . Tubal ligation     History reviewed. No pertinent family history.  History  Substance Use Topics  . Smoking status: Never Smoker   . Smokeless tobacco: Never Used  . Alcohol Use: No    OB History    Grav Para Term Preterm Abortions TAB SAB Ect Mult Living   3 2 2  1  1   2       Review of Systems  Constitutional: Negative for fever and chills.  HENT: Negative.   Eyes: Negative.   Respiratory: Negative.   Cardiovascular: Negative.   Gastrointestinal: Negative for nausea, abdominal pain, constipation and change in bowel habit.  Genitourinary: Positive for vaginal discharge and vaginal pain. Negative for dysuria, urgency, vaginal bleeding and pelvic pain.  Musculoskeletal: Negative.   Skin: Positive for rash.  Neurological: Negative.   Psychiatric/Behavioral: Negative.      Allergies  Review of patient's allergies indicates no known allergies.  Home Medications  No current outpatient prescriptions on file.  BP 130/89  Pulse 77  Temp(Src) 97.9 F (36.6 C) (Oral)  Resp 18  SpO2 100%  LMP 01/04/2011  Physical Exam  Nursing note and vitals reviewed. Constitutional: She is oriented to person, place, and time. She appears well-developed and well-nourished.  Neck: Neck supple.  Cardiovascular: Normal rate, regular rhythm and normal heart sounds.   Pulmonary/Chest: Effort normal and breath sounds normal. No respiratory distress.  Abdominal: Soft. Bowel sounds are normal. There is no tenderness.  Genitourinary:       hypopigmentation around the anus, non tender, no erythema, no itching. vagial walls irritated, white vaginal discharge. CMT present. No uterine tenderness, no adnexal tenderness  Neurological: She is alert and oriented to person, place, and time.  Skin: Skin is warm and dry.    ED Course  Procedures (including critical care time)  Labs Reviewed  URINALYSIS, ROUTINE W REFLEX MICROSCOPIC - Abnormal; Notable for the following:    Color, Urine AMBER (*) BIOCHEMICALS MAY BE AFFECTED BY COLOR   Leukocytes, UA SMALL (*)    All other components within normal limits  URINE MICROSCOPIC-ADD ON - Abnormal; Notable for the following:    Squamous Epithelial / LPF FEW (*)    All other components within normal limits  WET PREP, GENITAL - Abnormal; Notable for the following:    WBC, Wet Prep HPF POC MODERATE (*)    All other components within normal limits  PREGNANCY, URINE  GC/CHLAMYDIA PROBE AMP,  GENITAL  WET PREP, GENITAL   Pt with CMT, white vaginal discharge. Moderate WBCs on wet prep. Exam consistent with cervicitis. No uterine or adnexa tenderrness, doubt PID. PT non toxic, NAD. Will treat in ED. VS normal. Will d/c home with pcp follow up.  1. Cervicitis       MDM          Lottie Mussel, PA 01/06/11 1318

## 2011-01-07 LAB — GC/CHLAMYDIA PROBE AMP, GENITAL
Chlamydia, DNA Probe: NEGATIVE
GC Probe Amp, Genital: NEGATIVE

## 2011-01-08 ENCOUNTER — Emergency Department (HOSPITAL_COMMUNITY)
Admission: EM | Admit: 2011-01-08 | Discharge: 2011-01-08 | Disposition: A | Discharge status: Home / Self Care | Payer: Medicare Other | Attending: Emergency Medicine | Admitting: Emergency Medicine

## 2011-01-08 ENCOUNTER — Encounter (HOSPITAL_COMMUNITY): Payer: Self-pay | Admitting: Emergency Medicine

## 2011-01-08 DIAGNOSIS — N9089 Other specified noninflammatory disorders of vulva and perineum: Secondary | ICD-10-CM | POA: Insufficient documentation

## 2011-01-08 DIAGNOSIS — N898 Other specified noninflammatory disorders of vagina: Secondary | ICD-10-CM | POA: Insufficient documentation

## 2011-01-08 DIAGNOSIS — K644 Residual hemorrhoidal skin tags: Secondary | ICD-10-CM | POA: Insufficient documentation

## 2011-01-08 HISTORY — DX: Bronchitis, not specified as acute or chronic: J40

## 2011-01-08 LAB — WET PREP, GENITAL
Clue Cells Wet Prep HPF POC: NONE SEEN
Trich, Wet Prep: NONE SEEN

## 2011-01-08 LAB — URINALYSIS, ROUTINE W REFLEX MICROSCOPIC
Glucose, UA: NEGATIVE mg/dL
Hgb urine dipstick: NEGATIVE
Ketones, ur: >80 mg/dL — AB
Protein, ur: NEGATIVE mg/dL
pH: 6 (ref 5.0–8.0)

## 2011-01-08 LAB — PREGNANCY, URINE: Preg Test, Ur: NEGATIVE

## 2011-01-08 LAB — URINE MICROSCOPIC-ADD ON

## 2011-01-08 NOTE — ED Notes (Addendum)
Pt states that she has had itching and white discharge around her vagina for about 3 days. Has had a previous yeast infection this year. States she is sexually active. No abdominal pain. No urinary problems.

## 2011-01-08 NOTE — ED Provider Notes (Addendum)
History     CSN: [id number] Arrival date & time: 01/08/2011  3:41 PM   First MD Initiated Contact with Patient 01/08/11 1703      Chief Complaint  Patient presents with  . Vaginitis    (Consider location/radiation/quality/duration/timing/severity/associated sxs/prior treatment) HPI Comments: Emergency department for white vaginal discharge and itching for the last 3 days.  She states this is similar to her previous yeast infection.  Patient is currently sexually active and does not use protection.  Patient's last menstrual period was on the first.  Patient has no other complaints.  Denies abdominal pain including suprapubic pain and urinary symptoms including urinary frequency, urgency, retention, dysuria.  The history is provided by the patient.    Past Medical History  Diagnosis Date  . Asthma   . Bronchitis     Past Surgical History  Procedure Date  . Tubal ligation     History reviewed. No pertinent family history.  History  Substance Use Topics  . Smoking status: Never Smoker   . Smokeless tobacco: Never Used  . Alcohol Use: No    OB History    Grav Para Term Preterm Abortions TAB SAB Ect Mult Living   3 2 2  1  1   2       Review of Systems  All other systems reviewed and are negative.    Allergies  Review of patient's allergies indicates no known allergies.  Home Medications  No current outpatient prescriptions on file.  BP 119/64  Pulse 87  Temp(Src) 98.1 F (36.7 C) (Oral)  Resp 16  SpO2 100%  LMP 01/04/2011  Physical Exam  Nursing note and vitals reviewed. Constitutional: She is oriented to person, place, and time. She appears well-developed and well-nourished. No distress.  HENT:  Head: Normocephalic and atraumatic.  Eyes:       Normal appearance  Neck: Normal range of motion.  Pulmonary/Chest: Effort normal.  Abdominal: Soft. There is no tenderness (no superpubic or adenexenal tenderness ). There is no rigidity and no guarding.    Genitourinary:    Pelvic exam was performed with patient prone. There is no rash, tenderness, lesion or injury on the right labia. There is no rash, tenderness, lesion or injury on the left labia. Right adnexum displays no tenderness. Left adnexum displays no tenderness. No tenderness or bleeding around the vagina. Vaginal discharge (white mild amount, no odor) found.  Neurological: She is alert and oriented to person, place, and time.  Skin:     Psychiatric: She has a normal mood and affect. Her behavior is normal.    ED Course  Procedures (including critical care time)  Labs Reviewed  URINALYSIS, ROUTINE W REFLEX MICROSCOPIC - Abnormal; Notable for the following:    APPearance CLOUDY (*)    Bilirubin Urine SMALL (*)    Ketones, ur >80 (*)    Leukocytes, UA SMALL (*)    All other components within normal limits  WET PREP, GENITAL - Abnormal; Notable for the following:    WBC, Wet Prep HPF POC FEW (*)    All other components within normal limits  URINE MICROSCOPIC-ADD ON - Abnormal; Notable for the following:    Squamous Epithelial / LPF FEW (*)    All other components within normal limits  PREGNANCY, URINE  GC/CHLAMYDIA PROBE AMP, GENITAL   No results found.   No diagnosis found.  GC/chlmydia cultures mending; no yeast infection. Pt discharged with follow up w OBGYN if discharge and itching continues  Discussed  case with Dr. Hyman Hopes who agrees with plan to dc   MDM  Decreased skin pigment of genital skin. Vitiligo vs Tinea versicolor. Telling pt to treat with selsun blue & follow up with OBGYN for evaluation. This area has not been itching or irritating the pt.     Vaginal DC           [place], [place] 01/08/11 [phone]

## 2011-01-08 NOTE — ED Provider Notes (Signed)
Medical screening examination/treatment/procedure(s) were performed by non-physician practitioner and as supervising physician I was immediately available for consultation/collaboration.   Forbes Cellar, MD 01/08/11 [phone]

## 2011-01-09 ENCOUNTER — Inpatient Hospital Stay (HOSPITAL_COMMUNITY)
Admission: AD | Admit: 2011-01-09 | Discharge: 2011-01-09 | Disposition: A | Discharge status: Home / Self Care | Payer: Medicare Other | Source: Ambulatory Visit | Attending: Obstetrics and Gynecology | Admitting: Obstetrics and Gynecology

## 2011-01-09 ENCOUNTER — Encounter (HOSPITAL_COMMUNITY): Payer: Self-pay

## 2011-01-09 DIAGNOSIS — N949 Unspecified condition associated with female genital organs and menstrual cycle: Secondary | ICD-10-CM | POA: Insufficient documentation

## 2011-01-09 DIAGNOSIS — N760 Acute vaginitis: Secondary | ICD-10-CM | POA: Insufficient documentation

## 2011-01-09 DIAGNOSIS — A499 Bacterial infection, unspecified: Secondary | ICD-10-CM | POA: Insufficient documentation

## 2011-01-09 DIAGNOSIS — B9689 Other specified bacterial agents as the cause of diseases classified elsewhere: Secondary | ICD-10-CM | POA: Insufficient documentation

## 2011-01-09 LAB — URINALYSIS, ROUTINE W REFLEX MICROSCOPIC
Glucose, UA: NEGATIVE mg/dL
Hgb urine dipstick: NEGATIVE
pH: 6 (ref 5.0–8.0)

## 2011-01-09 LAB — GC/CHLAMYDIA PROBE AMP, GENITAL: Chlamydia, DNA Probe: NEGATIVE

## 2011-01-09 LAB — URINE MICROSCOPIC-ADD ON

## 2011-01-09 MED ORDER — FLUCONAZOLE 150 MG PO TABS: 150.0000 mg | ORAL_TABLET | ORAL | Status: AC

## 2011-01-09 MED ORDER — CLINDAMYCIN PHOSPHATE 100 MG VA SUPP: 100.0000 mg | Freq: Every day | VAGINAL | Status: DC

## 2011-01-09 NOTE — Progress Notes (Signed)
Pt reports having a white  vaginal discharge that started 2 days ago. Having mild discomfort as well.

## 2011-01-09 NOTE — Discharge Instructions (Signed)
Bacterial Vaginosis Bacterial vaginosis (BV) is a vaginal infection where the normal balance of bacteria in the vagina is disrupted. The normal balance is then replaced by an overgrowth of certain bacteria. There are several different kinds of bacteria that can cause BV. BV is the most common vaginal infection in women of childbearing age. CAUSES   The cause of BV is not fully understood. BV develops when there is an increase or imbalance of harmful bacteria.   Some activities or behaviors can upset the normal balance of bacteria in the vagina and put women at increased risk including:   Having a new sex partner or multiple sex partners.   Douching.   Using an intrauterine device (IUD) for contraception.   It is not clear what role sexual activity plays in the development of BV. However, women that have never had sexual intercourse are rarely infected with BV.  Women do not get BV from toilet seats, bedding, swimming pools or from touching objects around them.  SYMPTOMS   Grey vaginal discharge.   A fish-like odor with discharge, especially after sexual intercourse.   Itching or burning of the vagina and vulva.   Burning or pain with urination.   Some women have no signs or symptoms at all.  DIAGNOSIS  Your caregiver must examine the vagina for signs of BV. Your caregiver will perform lab tests and look at the sample of vaginal fluid through a microscope. They will look for bacteria and abnormal cells (clue cells), a pH test higher than 4.5, and a positive amine test all associated with BV.  RISKS AND COMPLICATIONS   Pelvic inflammatory disease (PID).   Infections following gynecology surgery.   Developing HIV.   Developing herpes virus.  TREATMENT  Sometimes BV will clear up without treatment. However, all women with symptoms of BV should be treated to avoid complications, especially if gynecology surgery is planned. Female partners generally do not need to be treated. However,  BV may spread between female sex partners so treatment is helpful in preventing a recurrence of BV.   BV may be treated with antibiotics. The antibiotics come in either pill or vaginal cream forms. Either can be used with nonpregnant or pregnant women, but the recommended dosages differ. These antibiotics are not harmful to the baby.   BV can recur after treatment. If this happens, a second round of antibiotics will often be prescribed.   Treatment is important for pregnant women. If not treated, BV can cause a premature delivery, especially for a pregnant woman who had a premature birth in the past. All pregnant women who have symptoms of BV should be checked and treated.   For chronic reoccurrence of BV, treatment with a type of prescribed gel vaginally twice a week is helpful.  HOME CARE INSTRUCTIONS   Finish all medication as directed by your caregiver.   Do not have sex until treatment is completed.   Tell your sexual partner that you have a vaginal infection. They should see their caregiver and be treated if they have problems, such as a mild rash or itching.   Practice safe sex. Use condoms. Only have 1 sex partner.  PREVENTION  Basic prevention steps can help reduce the risk of upsetting the natural balance of bacteria in the vagina and developing BV:  Do not have sexual intercourse (be abstinent).   Do not douche.   Use all of the medicine prescribed for treatment of BV, even if the signs and symptoms go away.  Tell your sex partner if you have BV. That way, they can be treated, if needed, to prevent reoccurrence.  SEEK MEDICAL CARE IF:   Your symptoms are not improving after 3 days of treatment.   You have increased discharge, pain, or fever.  MAKE SURE YOU:   Understand these instructions.   Will watch your condition.   Will get help right away if you are not doing well or get worse.  FOR MORE INFORMATION  Division of STD Prevention (DSTDP), Centers for Disease  Control and Prevention: SolutionApps.co.za American Social Health Association (ASHA): www.ashastd.org  Document Released: 01/18/2005 Document Revised: 09/30/2010 Document Reviewed: 07/11/2008 [facility] Patient Information 2012 [place], [place].Bacterial Vaginosis Bacterial vaginosis (BV) is a vaginal infection where the normal balance of bacteria in the vagina is disrupted. The normal balance is then replaced by an overgrowth of certain bacteria. There are several different kinds of bacteria that can cause BV. BV is the most common vaginal infection in women of childbearing age. CAUSES   The cause of BV is not fully understood. BV develops when there is an increase or imbalance of harmful bacteria.   Some activities or behaviors can upset the normal balance of bacteria in the vagina and put women at increased risk including:   Having a new sex partner or multiple sex partners.   Douching.   Using an intrauterine device (IUD) for contraception.   It is not clear what role sexual activity plays in the development of BV. However, women that have never had sexual intercourse are rarely infected with BV.  Women do not get BV from toilet seats, bedding, swimming pools or from touching objects around them.  SYMPTOMS   Grey vaginal discharge.   A fish-like odor with discharge, especially after sexual intercourse.   Itching or burning of the vagina and vulva.   Burning or pain with urination.   Some women have no signs or symptoms at all.  DIAGNOSIS  Your caregiver must examine the vagina for signs of BV. Your caregiver will perform lab tests and look at the sample of vaginal fluid through a microscope. They will look for bacteria and abnormal cells (clue cells), a pH test higher than 4.5, and a positive amine test all associated with BV.  RISKS AND COMPLICATIONS   Pelvic inflammatory disease (PID).   Infections following gynecology surgery.   Developing HIV.   Developing herpes virus.    TREATMENT  Sometimes BV will clear up without treatment. However, all women with symptoms of BV should be treated to avoid complications, especially if gynecology surgery is planned. Female partners generally do not need to be treated. However, BV may spread between female sex partners so treatment is helpful in preventing a recurrence of BV.   BV may be treated with antibiotics. The antibiotics come in either pill or vaginal cream forms. Either can be used with nonpregnant or pregnant women, but the recommended dosages differ. These antibiotics are not harmful to the baby.   BV can recur after treatment. If this happens, a second round of antibiotics will often be prescribed.   Treatment is important for pregnant women. If not treated, BV can cause a premature delivery, especially for a pregnant woman who had a premature birth in the past. All pregnant women who have symptoms of BV should be checked and treated.   For chronic reoccurrence of BV, treatment with a type of prescribed gel vaginally twice a week is helpful.  HOME CARE INSTRUCTIONS   Finish all  medication as directed by your caregiver.   Do not have sex until treatment is completed.   Tell your sexual partner that you have a vaginal infection. They should see their caregiver and be treated if they have problems, such as a mild rash or itching.   Practice safe sex. Use condoms. Only have 1 sex partner.  PREVENTION  Basic prevention steps can help reduce the risk of upsetting the natural balance of bacteria in the vagina and developing BV:  Do not have sexual intercourse (be abstinent).   Do not douche.   Use all of the medicine prescribed for treatment of BV, even if the signs and symptoms go away.   Tell your sex partner if you have BV. That way, they can be treated, if needed, to prevent reoccurrence.  SEEK MEDICAL CARE IF:   Your symptoms are not improving after 3 days of treatment.   You have increased discharge, pain,  or fever.  MAKE SURE YOU:   Understand these instructions.   Will watch your condition.   Will get help right away if you are not doing well or get worse.  FOR MORE INFORMATION  Division of STD Prevention (DSTDP), Centers for Disease Control and Prevention: SolutionApps.co.za American Social Health Association (ASHA): www.ashastd.org  Document Released: 01/18/2005 Document Revised: 09/30/2010 Document Reviewed: 07/11/2008 [facility] Patient Information 2012 [place], [place].  Purchase over the counter probiotic (suggested brands:  Align or Culturelle) capsules and use 1 capsule by mouth daily to prevent infection from coming back.

## 2011-01-09 NOTE — ED Provider Notes (Signed)
Medical screening examination/treatment/procedure(s) were conducted as a shared visit with non-physician practitioner(s) and myself.  I personally evaluated the patient during the encounter   Forbes Cellar, MD 01/09/11 1506

## 2011-01-09 NOTE — ED Provider Notes (Signed)
History     Chief Complaint  Patient presents with  . Vaginal Discharge   HPI Pt presents with c/o of vaginal discharge with internal itching.  Pt reports onset of symptoms approx 2 weeks ago.  She reports she was treated last week at [facility] by the NP and was given a rx for Metronidazole bid for 5 days which she completed as rxed but continues to have discharge.  She also was seen at [facility] on 12/7 (day prior to admission at [facility]) and at that time had neg GC/Chl and neg wet prep.  She was not treated with any medications at that time.  She reports no significant itching at the present time and denies any hx of STDs in the past.  She reports she has not had issues with recurrent vaginitis in the past.  She does report that she has a "tumor" in her uterus and is she is scheduled for surgery with Dr. Estanislado Pandy at the end of December.  She reports she does not have regular sexual activity.    Pertinent Gynecological History: Menses: regular every 30 days without intermenstrual spotting Bleeding: None Contraception: tubal ligation DES exposure: unknown Blood transfusions: none Sexually transmitted diseases: no past history Previous GYN Procedures: None  Last mammogram: Unknown Date: unknown Last pap: Unknown Date: unknown   Past Medical History  Diagnosis Date  . Asthma   . Bronchitis     Past Surgical History  Procedure Date  . Tubal ligation     History reviewed. No pertinent family history.  History  Substance Use Topics  . Smoking status: Never Smoker   . Smokeless tobacco: Never Used  . Alcohol Use: No    Allergies: No Known Allergies  Prescriptions prior to admission  Medication Sig Dispense Refill  . naproxen sodium (ANAPROX) 220 MG tablet Take 220 mg by mouth 2 (two) times daily with a meal. For pain         Review of Systems  Constitutional: Negative.   HENT: Negative.   Eyes: Negative.   Respiratory: Negative.   Cardiovascular: Negative.   Gastrointestinal:  Negative.   Genitourinary: Negative.   Musculoskeletal: Negative.   Skin: Negative.   Neurological: Negative.   Endo/Heme/Allergies: Negative.   Psychiatric/Behavioral: Negative.    Physical Exam   Blood pressure 118/76, pulse 94, temperature 98.8 F (37.1 C), temperature source Oral, resp. rate 18, height 5' (1.524 m), weight 85.911 kg (189 lb 6.4 oz), last menstrual period 01/04/2011.  Physical Exam  Constitutional: She is oriented to person, place, and time. She appears well-developed and well-nourished.  HENT:  Head: Normocephalic and atraumatic.  Right Ear: External ear normal.  Left Ear: External ear normal.  Nose: Nose normal.  Eyes: Conjunctivae are normal. Pupils are equal, round, and reactive to light.  Neck: Normal range of motion. Neck supple.  Cardiovascular: Normal rate, regular rhythm and intact distal pulses.   Respiratory: Effort normal and breath sounds normal.  GI: Soft. Bowel sounds are normal.  Genitourinary: Uterus normal. Vaginal discharge found.       Ext genitalia with areas of absent pigmentation noted but no thickening noted.  No other lesions noted.  BUS neg.  Moderate amt of thin white discharge noted on vulva and in vaginal vault.  Cx non-friable without lesions noted.  Vag sl reddened with normal rugae present.   Cx closed/long/firm.  Neg CMT.  Ut mobile, NT and RV-difficult to assess ut size due to position and habitus. Bilat adnexa without masses  or tenderness. Rectum without lesions, sm non-thrombosed hemorrhoid noted.    Musculoskeletal: Normal range of motion.  Neurological: She is alert and oriented to person, place, and time. She has normal reflexes.  Skin: Skin is warm and dry.  Psychiatric: She has a normal mood and affect. Her behavior is normal. Judgment and thought content normal.    [facility] Course  Procedures Wet Prep-pos clue cells, neg yeast, neg trich on slide performed on unit.   Results for orders placed during the hospital encounter of  01/09/11 (from the past 24 hour(s))  URINALYSIS, ROUTINE W REFLEX MICROSCOPIC     Status: Abnormal   Collection Time   01/09/11  3:00 PM      Component Value Range   Color, Urine YELLOW  YELLOW    APPearance CLEAR  CLEAR    Specific Gravity, Urine 1.020  1.005 - 1.030    pH 6.0  5.0 - 8.0    Glucose, UA NEGATIVE  NEGATIVE (mg/dL)   Hgb urine dipstick NEGATIVE  NEGATIVE    Bilirubin Urine NEGATIVE  NEGATIVE    Ketones, ur 15 (*) NEGATIVE (mg/dL)   Protein, ur NEGATIVE  NEGATIVE (mg/dL)   Urobilinogen, UA 1.0  0.0 - 1.0 (mg/dL)   Nitrite NEGATIVE  NEGATIVE    Leukocytes, UA SMALL (*) NEGATIVE   URINE MICROSCOPIC-ADD ON     Status: Abnormal   Collection Time   01/09/11  3:00 PM      Component Value Range   Squamous Epithelial / LPF FEW (*) RARE    WBC, UA 0-2  <3 (WBC/hpf)   RBC / HPF 0-2  <3 (RBC/hpf)   Bacteria, UA RARE  RARE    Urine-Other Specimen less than 2cc, QNS to concentrate    WET PREP, GENITAL     Status: Abnormal   Collection Time   01/09/11  6:00 PM      Component Value Range   Yeast, Wet Prep NONE SEEN  NONE SEEN    Trich, Wet Prep NONE SEEN  NONE SEEN    Clue Cells, Wet Prep NONE SEEN  NONE SEEN    WBC, Wet Prep HPF POC MODERATE (*) NONE SEEN     Assessment and Plan  Bacterial vaginosis  Consult obtained with Dr. Normand Sloop.  Discharged to home. Rx Cleocin vag suppository and Diflucan given with instructions. Rec pt begin probiotics (OTC). Pt to call [facility] and be seen in 10 days for followup.    SMITH,NONA O. 01/09/2011, 7:05 PM

## 2011-01-15 ENCOUNTER — Inpatient Hospital Stay (HOSPITAL_COMMUNITY)
Admission: AD | Admit: 2011-01-15 | Discharge: 2011-01-15 | Disposition: A | Discharge status: Home / Self Care | Payer: Medicare Other | Source: Ambulatory Visit | Attending: Obstetrics and Gynecology | Admitting: Obstetrics and Gynecology

## 2011-01-15 ENCOUNTER — Encounter (HOSPITAL_COMMUNITY): Payer: Self-pay | Admitting: *Deleted

## 2011-01-15 DIAGNOSIS — B379 Candidiasis, unspecified: Secondary | ICD-10-CM

## 2011-01-15 DIAGNOSIS — N949 Unspecified condition associated with female genital organs and menstrual cycle: Secondary | ICD-10-CM | POA: Insufficient documentation

## 2011-01-15 DIAGNOSIS — B3731 Acute candidiasis of vulva and vagina: Secondary | ICD-10-CM | POA: Insufficient documentation

## 2011-01-15 DIAGNOSIS — B373 Candidiasis of vulva and vagina: Secondary | ICD-10-CM | POA: Insufficient documentation

## 2011-01-15 HISTORY — DX: Benign neoplasm of connective and other soft tissue, unspecified: D21.9

## 2011-01-15 LAB — URINALYSIS, ROUTINE W REFLEX MICROSCOPIC
Bilirubin Urine: NEGATIVE
Glucose, UA: NEGATIVE mg/dL
Hgb urine dipstick: NEGATIVE
Specific Gravity, Urine: 1.015 (ref 1.005–1.030)
Urobilinogen, UA: 1 mg/dL (ref 0.0–1.0)
pH: 6 (ref 5.0–8.0)

## 2011-01-15 LAB — WET PREP, GENITAL
Clue Cells Wet Prep HPF POC: NONE SEEN
Trich, Wet Prep: NONE SEEN
Yeast Wet Prep HPF POC: NONE SEEN

## 2011-01-15 MED ORDER — TERCONAZOLE 0.4 % VA CREA: 1.0000 | TOPICAL_CREAM | Freq: Every day | VAGINAL | Status: DC

## 2011-01-15 MED ORDER — FLUCONAZOLE 150 MG PO TABS: 150.0000 mg | ORAL_TABLET | Freq: Once | ORAL | Status: AC

## 2011-01-15 MED ORDER — TERCONAZOLE 0.4 % VA CREA: 1.0000 | TOPICAL_CREAM | Freq: Every day | VAGINAL | Status: AC

## 2011-01-15 NOTE — Progress Notes (Addendum)
Subjective: Patient reports treated with metrogel has one night leftof medication, feeling itchy with white discharge today, had skin biopsy at [facility] Tues with [place].  Is scheduled for robotic removal of fibroids with Dr. Estanislado Pandy 02/03/10.    Objective: I have reviewed patient's medications and labs.  Female in no distress, speculum exam white discharge with large amount at introitus, EGBUS WNL exception hypopigmentation at perineum rectal area. Wet prep neg   Assessment/Plan: Seen by Julia Shaw, CNM. Appearance of Candidia RX terazol 7, diflucan po today and repeat 1 week, discussed baking soda bathes and avoiding fragrant soaps and douching. F/o as scheduled.  LOS: 0 days    Julia Shaw 01/15/2011, 1:45 PM

## 2011-01-15 NOTE — Progress Notes (Signed)
Pt states she has been taking Metronidazole for BV x5 days still has symptoms of white discharge and vaginal discomfort.

## 2011-01-21 MED ORDER — KETOROLAC TROMETHAMINE 60 MG/2ML IM SOLN
INTRAMUSCULAR | Status: AC
  Filled 2011-01-21: qty 2

## 2011-02-07 ENCOUNTER — Inpatient Hospital Stay (HOSPITAL_COMMUNITY)
Admission: AD | Admit: 2011-02-07 | Discharge: 2011-02-08 | Disposition: A | Discharge status: Home / Self Care | Payer: Medicare Other | Source: Ambulatory Visit | Attending: Obstetrics and Gynecology | Admitting: Obstetrics and Gynecology

## 2011-02-07 DIAGNOSIS — R109 Unspecified abdominal pain: Secondary | ICD-10-CM | POA: Diagnosis not present

## 2011-02-07 DIAGNOSIS — A499 Bacterial infection, unspecified: Secondary | ICD-10-CM | POA: Diagnosis not present

## 2011-02-07 DIAGNOSIS — D219 Benign neoplasm of connective and other soft tissue, unspecified: Secondary | ICD-10-CM

## 2011-02-07 DIAGNOSIS — N760 Acute vaginitis: Secondary | ICD-10-CM | POA: Insufficient documentation

## 2011-02-07 DIAGNOSIS — N9489 Other specified conditions associated with female genital organs and menstrual cycle: Secondary | ICD-10-CM | POA: Diagnosis not present

## 2011-02-07 DIAGNOSIS — B9689 Other specified bacterial agents as the cause of diseases classified elsewhere: Secondary | ICD-10-CM | POA: Insufficient documentation

## 2011-02-07 NOTE — Progress Notes (Signed)
Pt states she has a hx of a fibroid tumor-sttes the pain in her vaginal started yesterday

## 2011-02-08 ENCOUNTER — Encounter (HOSPITAL_COMMUNITY): Payer: Self-pay | Admitting: *Deleted

## 2011-02-08 LAB — URINALYSIS, ROUTINE W REFLEX MICROSCOPIC
Glucose, UA: NEGATIVE mg/dL
Ketones, ur: NEGATIVE mg/dL
Leukocytes, UA: NEGATIVE
Protein, ur: NEGATIVE mg/dL
Urobilinogen, UA: 0.2 mg/dL (ref 0.0–1.0)

## 2011-02-08 LAB — WET PREP, GENITAL

## 2011-02-08 MED ORDER — KETOROLAC TROMETHAMINE 60 MG/2ML IM SOLN
60.0000 mg | Freq: Once | INTRAMUSCULAR | Status: AC
  Administered 2011-02-08: 60 mg via INTRAMUSCULAR
  Filled 2011-02-08: qty 2

## 2011-02-08 MED ORDER — METRONIDAZOLE 500 MG PO TABS: 500.0000 mg | ORAL_TABLET | Freq: Two times a day (BID) | ORAL | Status: AC

## 2011-02-08 NOTE — ED Provider Notes (Signed)
History   36 yo G3P2012 presented unannounced c/o vaginal and abdominal pain.  Denies d/c, dysuria, fever, DUB, or any other symptom.  Has known large fibroids, scheduled for robotic myomectomy 03/03/11 with Dr. Estanislado Pandy, but now advises she may have to cancel/postpone surgery due to finances.  Has pain medication at home, but has not taken any. Thinks pain started yesterday, but not sure. Hx BTL.  No recent sexual activity.   Patient is poor historian, with evidence of some cognitive limitation.  Accompanied by elderly relative.  Patient is in NAD.  Seen 12/5,12/7 at other ERs in system, 12/8  and 12/14 at [facility] for various vaginal complaints--treated for trich, BV, and yeast during that time.  Chief Complaint  Patient presents with  . Vaginal Pain     OB History    Grav Para Term Preterm Abortions TAB SAB Ect Mult Living   3 2 2  1  1   2       Past Medical History  Diagnosis Date  . Asthma   . Bronchitis   . Fibroid     Past Surgical History  Procedure Date  . Tubal ligation   . Dental surgery     History reviewed. No pertinent family history.  History  Substance Use Topics  . Smoking status: Never Smoker   . Smokeless tobacco: Never Used  . Alcohol Use: No    Allergies: No Known Allergies  No prescriptions prior to admission     Physical Exam   Blood pressure 109/54, pulse 88, temperature 98.5 F (36.9 C), resp. rate 20, height 5\' 1"  (1.549 m), weight 85.446 kg (188 lb 6 oz), last menstrual period 01/28/2011, SpO2 99.00%.  Chest clear Heart RRR without murmur Abd soft, NT, no rebound or guarding Back--negative CVAT Pelvic--small amount white vaginal discharge, no CMT.  No pain on vaginal palpation.  Area of hypopigmentation on right inner buttocks area--this was biopsied at [facility] recently, and patient has been referred to dermatologist for follow-up. Uterus approx 16 week size, irregular shaped, NT.  Results for orders placed during the hospital encounter of  02/07/11 (from the past 24 hour(s))  URINALYSIS, ROUTINE W REFLEX MICROSCOPIC     Status: Normal   Collection Time   02/08/11  1:18 AM      Component Value Range   Color, Urine YELLOW  YELLOW    APPearance CLEAR  CLEAR    Specific Gravity, Urine 1.010  1.005 - 1.030    pH 6.0  5.0 - 8.0    Glucose, UA NEGATIVE  NEGATIVE (mg/dL)   Hgb urine dipstick NEGATIVE  NEGATIVE    Bilirubin Urine NEGATIVE  NEGATIVE    Ketones, ur NEGATIVE  NEGATIVE (mg/dL)   Protein, ur NEGATIVE  NEGATIVE (mg/dL)   Urobilinogen, UA 0.2  0.0 - 1.0 (mg/dL)   Nitrite NEGATIVE  NEGATIVE    Leukocytes, UA NEGATIVE  NEGATIVE   WET PREP, GENITAL     Status: Abnormal   Collection Time   02/08/11  1:18 AM      Component Value Range   Yeast, Wet Prep NONE SEEN  NONE SEEN    Trich, Wet Prep NONE SEEN  NONE SEEN    Clue Cells, Wet Prep MODERATE (*) NONE SEEN    WBC, Wet Prep HPF POC FEW (*) NONE SEEN      ED Course  Mild abdominal pain, probably due to known fibroid BV  Plan: Toradol 60 mg IM Rx Metronidazole 500 mg po BID x  7 days. D/C home with recommendation to follow-up at [facility] regarding arrangements for surgery and any further issues.  Nigel Bridgeman, CNM, [place] 02/08/11 1:50am

## 2011-02-08 NOTE — Progress Notes (Signed)
SSE per CNM.  Wet prep and cultures collected.  VE done.  

## 2011-02-08 NOTE — Progress Notes (Signed)
Manfred Arch, CNM at bedside.  Lab results discussed with pt.

## 2011-02-08 NOTE — Progress Notes (Signed)
V. Latham, CNM at bedside.  Assessment done and poc discussed with pt.  

## 2011-02-24 DIAGNOSIS — D259 Leiomyoma of uterus, unspecified: Secondary | ICD-10-CM | POA: Diagnosis not present
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
## 2011-03-01 ENCOUNTER — Encounter (HOSPITAL_COMMUNITY): Payer: Self-pay

## 2011-03-01 ENCOUNTER — Encounter (HOSPITAL_COMMUNITY)
Admission: RE | Admit: 2011-03-01 | Discharge: 2011-03-01 | Disposition: A | Discharge status: Home / Self Care | Payer: Medicare Other | Source: Ambulatory Visit | Attending: Obstetrics and Gynecology | Admitting: Obstetrics and Gynecology

## 2011-03-01 DIAGNOSIS — Z01812 Encounter for preprocedural laboratory examination: Secondary | ICD-10-CM | POA: Diagnosis not present

## 2011-03-01 DIAGNOSIS — IMO0002 Reserved for concepts with insufficient information to code with codable children: Secondary | ICD-10-CM | POA: Diagnosis not present

## 2011-03-01 DIAGNOSIS — Z01818 Encounter for other preprocedural examination: Secondary | ICD-10-CM | POA: Diagnosis not present

## 2011-03-01 DIAGNOSIS — D252 Subserosal leiomyoma of uterus: Secondary | ICD-10-CM | POA: Diagnosis not present

## 2011-03-01 LAB — BASIC METABOLIC PANEL
Chloride: 101 mEq/L (ref 96–112)
GFR calc Af Amer: >90 mL/min (ref >90)
GFR calc non Af Amer: >90 mL/min (ref >90)
Glucose, Bld: 91 mg/dL (ref 70–99)
Potassium: 4.5 mEq/L (ref 3.5–5.1)
Sodium: 134 mEq/L — ABNORMAL LOW (ref 135–145)

## 2011-03-01 LAB — CBC
Hemoglobin: 13.7 g/dL (ref 12.0–15.0)
MCH: 28.8 pg (ref 26.0–34.0)
RBC: 4.76 MIL/uL (ref 3.87–5.11)

## 2011-03-01 LAB — SURGICAL PCR SCREEN: MRSA, PCR: NEGATIVE

## 2011-03-01 NOTE — Patient Instructions (Addendum)
   Your procedure is scheduled on: Wednesday, Jan 30th  Enter through the [employer] [facility] at: [employer] up the phone at the desk and dial [phone] and inform us of your arrival.  Please call this number if you have any problems the morning of surgery: [phone]  Remember: Do not eat food after midnight: Tuesday Do not drink clear liquids after: tuesday Take these medicines the morning of surgery with a SIP OF WATER:  Do not wear jewelry, make-up, or FINGER nail polish Do not wear lotions, powders, perfumes or deodorant. Do not shave 48 hours prior to surgery. Do not bring valuables to the hospital.  Leave suitcase in the car. After Surgery it may be brought to your room. For patients being admitted to the hospital, checkout time is 11:00am the day of discharge.  Patients discharged on the day of surgery will not be allowed to drive home.     Remember to use your hibiclens as instructed.Please shower with 1/2 bottle the evening before your surgery and the other 1/2 bottle the morning of surgery.  20 LENOX LADOUCEUR  03/01/2011   Your procedure is scheduled on:  03/03/11  Enter through the Main Entrance of [facility] at 6 AM.  Pick up the phone at the desk and dial [phone].   Call this number if you have problems the morning of surgery: [phone]   Remember:   Do not eat food:After Midnight.  Do not drink clear liquids: After Midnight.  Take these medicines the morning of surgery with A SIP OF WATER: NA   Do not wear jewelry, make-up or nail polish.  Do not wear lotions, powders, or perfumes. You may wear deodorant.  Do not shave 48 hours prior to surgery.  Do not bring valuables to the hospital.  Contacts, dentures or bridgework may not be worn into surgery.  Leave suitcase in the car. After surgery it may be brought to your room.  For patients admitted to the hospital, checkout time is 11:00 AM the day of discharge.   Patients discharged the day of  surgery will not be allowed to drive home.  Name and phone number of your driver: NA  Special Instructions: CHG Shower Use Special Wash: 1/2 bottle night before surgery and 1/2 bottle morning of surgery.   Please read over the following fact sheets that you were given: MRSA Information

## 2011-03-02 MED ORDER — DEXTROSE 5 % IV SOLN
2.0000 g | INTRAVENOUS | Status: AC
  Administered 2011-03-03: 2 g via INTRAVENOUS
  Filled 2011-03-02: qty 2

## 2011-03-03 ENCOUNTER — Encounter (HOSPITAL_COMMUNITY): Payer: Self-pay | Admitting: *Deleted

## 2011-03-03 ENCOUNTER — Encounter (HOSPITAL_COMMUNITY): Payer: Self-pay | Admitting: Anesthesiology

## 2011-03-03 ENCOUNTER — Other Ambulatory Visit: Payer: Self-pay | Admitting: Obstetrics and Gynecology

## 2011-03-03 ENCOUNTER — Encounter (HOSPITAL_COMMUNITY)
Admission: RE | Disposition: A | Discharge status: Home / Self Care | Payer: Self-pay | Source: Ambulatory Visit | Attending: Obstetrics and Gynecology

## 2011-03-03 ENCOUNTER — Ambulatory Visit (HOSPITAL_COMMUNITY)
Admission: RE | Admit: 2011-03-03 | Discharge: 2011-03-03 | Disposition: A | Discharge status: Home / Self Care | Payer: Medicare Other | Source: Ambulatory Visit | Attending: Obstetrics and Gynecology | Admitting: Obstetrics and Gynecology

## 2011-03-03 ENCOUNTER — Ambulatory Visit (HOSPITAL_COMMUNITY): Payer: Medicare Other | Admitting: Anesthesiology

## 2011-03-03 DIAGNOSIS — Z01812 Encounter for preprocedural laboratory examination: Secondary | ICD-10-CM | POA: Insufficient documentation

## 2011-03-03 DIAGNOSIS — IMO0002 Reserved for concepts with insufficient information to code with codable children: Secondary | ICD-10-CM | POA: Insufficient documentation

## 2011-03-03 DIAGNOSIS — D252 Subserosal leiomyoma of uterus: Secondary | ICD-10-CM | POA: Insufficient documentation

## 2011-03-03 DIAGNOSIS — D259 Leiomyoma of uterus, unspecified: Secondary | ICD-10-CM

## 2011-03-03 DIAGNOSIS — Z01818 Encounter for other preprocedural examination: Secondary | ICD-10-CM | POA: Diagnosis not present

## 2011-03-03 HISTORY — PX: ROBOT ASSISTED MYOMECTOMY: SHX5142

## 2011-03-03 LAB — PREGNANCY, URINE: Preg Test, Ur: NEGATIVE

## 2011-03-03 SURGERY — ROBOTIC ASSISTED MYOMECTOMY
Anesthesia: General | Site: Abdomen | Wound class: Clean Contaminated

## 2011-03-03 MED ORDER — ROCURONIUM BROMIDE 50 MG/5ML IV SOLN
INTRAVENOUS | Status: AC
  Filled 2011-03-03: qty 2

## 2011-03-03 MED ORDER — ONDANSETRON HCL 4 MG PO TABS: 4.0000 mg | ORAL_TABLET | Freq: Four times a day (QID) | ORAL | Status: DC | PRN

## 2011-03-03 MED ORDER — LACTATED RINGERS IV SOLN
INTRAVENOUS | Status: DC
  Administered 2011-03-03 (×2): via INTRAVENOUS

## 2011-03-03 MED ORDER — DEXAMETHASONE SODIUM PHOSPHATE 10 MG/ML IJ SOLN
INTRAMUSCULAR | Status: AC
  Filled 2011-03-03: qty 1

## 2011-03-03 MED ORDER — VASOPRESSIN 20 UNIT/ML IJ SOLN
INTRAMUSCULAR | Status: DC | PRN
  Administered 2011-03-03: 37.5 [IU]

## 2011-03-03 MED ORDER — ONDANSETRON HCL 4 MG/2ML IJ SOLN
INTRAMUSCULAR | Status: AC
  Filled 2011-03-03: qty 2

## 2011-03-03 MED ORDER — DOCUSATE SODIUM 100 MG PO CAPS: 100.0000 mg | ORAL_CAPSULE | Freq: Two times a day (BID) | ORAL | Status: DC

## 2011-03-03 MED ORDER — LIDOCAINE HCL (CARDIAC) 20 MG/ML IV SOLN
INTRAVENOUS | Status: DC | PRN
  Administered 2011-03-03: 60 mg via INTRAVENOUS

## 2011-03-03 MED ORDER — KETOROLAC TROMETHAMINE 60 MG/2ML IM SOLN
INTRAMUSCULAR | Status: DC | PRN
  Administered 2011-03-03: 30 mg via INTRAMUSCULAR

## 2011-03-03 MED ORDER — ONDANSETRON HCL 4 MG/2ML IJ SOLN: 4.0000 mg | Freq: Four times a day (QID) | INTRAMUSCULAR | Status: DC | PRN

## 2011-03-03 MED ORDER — LACTATED RINGERS IR SOLN
Status: DC | PRN
  Administered 2011-03-03: 3000 mL

## 2011-03-03 MED ORDER — IBUPROFEN 600 MG PO TABS: 600.0000 mg | ORAL_TABLET | Freq: Four times a day (QID) | ORAL | Status: AC

## 2011-03-03 MED ORDER — LIDOCAINE HCL (CARDIAC) 20 MG/ML IV SOLN
INTRAVENOUS | Status: AC
Start: 2011-03-03 — End: 2011-03-03
  Filled 2011-03-03: qty 5

## 2011-03-03 MED ORDER — PROMETHAZINE HCL 12.5 MG PO TABS: 12.5000 mg | ORAL_TABLET | Freq: Four times a day (QID) | ORAL | Status: AC | PRN

## 2011-03-03 MED ORDER — ARTIFICIAL TEARS OP OINT
TOPICAL_OINTMENT | OPHTHALMIC | Status: DC | PRN
  Administered 2011-03-03: 1 via OPHTHALMIC

## 2011-03-03 MED ORDER — PROPOFOL 10 MG/ML IV EMUL
INTRAVENOUS | Status: AC
  Filled 2011-03-03: qty 20

## 2011-03-03 MED ORDER — BUPIVACAINE HCL (PF) 0.25 % IJ SOLN
INTRAMUSCULAR | Status: AC
  Filled 2011-03-03: qty 30

## 2011-03-03 MED ORDER — GLYCOPYRROLATE 0.2 MG/ML IJ SOLN
INTRAMUSCULAR | Status: DC | PRN
  Administered 2011-03-03: 1 mg via INTRAVENOUS
  Administered 2011-03-03: 0.1 mg via INTRAVENOUS

## 2011-03-03 MED ORDER — KETOROLAC TROMETHAMINE 30 MG/ML IJ SOLN
INTRAMUSCULAR | Status: DC | PRN
  Administered 2011-03-03: 30 mg via INTRAVENOUS

## 2011-03-03 MED ORDER — TEMAZEPAM 15 MG PO CAPS: 15.0000 mg | ORAL_CAPSULE | Freq: Every evening | ORAL | Status: DC | PRN

## 2011-03-03 MED ORDER — SCOPOLAMINE 1 MG/3DAYS TD PT72: 1.0000 | MEDICATED_PATCH | Freq: Once | TRANSDERMAL | Status: DC

## 2011-03-03 MED ORDER — IBUPROFEN 600 MG PO TABS
600.0000 mg | ORAL_TABLET | Freq: Four times a day (QID) | ORAL | Status: DC | PRN
  Administered 2011-03-03: 600 mg via ORAL
  Filled 2011-03-03: qty 1

## 2011-03-03 MED ORDER — MIDAZOLAM HCL 2 MG/2ML IJ SOLN
INTRAMUSCULAR | Status: AC
  Filled 2011-03-03: qty 2

## 2011-03-03 MED ORDER — GLYCOPYRROLATE 0.2 MG/ML IJ SOLN
INTRAMUSCULAR | Status: AC
  Filled 2011-03-03: qty 3

## 2011-03-03 MED ORDER — ROCURONIUM BROMIDE 100 MG/10ML IV SOLN
INTRAVENOUS | Status: DC | PRN
  Administered 2011-03-03: 5 mg via INTRAVENOUS
  Administered 2011-03-03: 45 mg via INTRAVENOUS
  Administered 2011-03-03: 30 mg via INTRAVENOUS

## 2011-03-03 MED ORDER — VASOPRESSIN 20 UNIT/ML IJ SOLN
INTRAMUSCULAR | Status: AC
  Filled 2011-03-03: qty 1

## 2011-03-03 MED ORDER — PROPOFOL 10 MG/ML IV EMUL
INTRAVENOUS | Status: DC | PRN
  Administered 2011-03-03: 150 mg via INTRAVENOUS

## 2011-03-03 MED ORDER — NEOSTIGMINE METHYLSULFATE 1 MG/ML IJ SOLN
INTRAMUSCULAR | Status: DC | PRN
  Administered 2011-03-03: 5 mg via INTRAVENOUS

## 2011-03-03 MED ORDER — FENTANYL CITRATE 0.05 MG/ML IJ SOLN
INTRAMUSCULAR | Status: AC
  Filled 2011-03-03: qty 5

## 2011-03-03 MED ORDER — ONDANSETRON HCL 4 MG/2ML IJ SOLN
INTRAMUSCULAR | Status: DC | PRN
  Administered 2011-03-03: 4 mg via INTRAVENOUS

## 2011-03-03 MED ORDER — BUPIVACAINE HCL (PF) 0.25 % IJ SOLN
INTRAMUSCULAR | Status: DC | PRN
  Administered 2011-03-03: 11 mL

## 2011-03-03 MED ORDER — KETOROLAC TROMETHAMINE 60 MG/2ML IM SOLN
INTRAMUSCULAR | Status: AC
  Filled 2011-03-03: qty 2

## 2011-03-03 MED ORDER — MENTHOL 3 MG MT LOZG: 1.0000 | LOZENGE | OROMUCOSAL | Status: DC | PRN

## 2011-03-03 MED ORDER — FENTANYL CITRATE 0.05 MG/ML IJ SOLN
INTRAMUSCULAR | Status: AC
  Administered 2011-03-03: 50 ug via INTRAVENOUS
  Filled 2011-03-03: qty 2

## 2011-03-03 MED ORDER — NEOSTIGMINE METHYLSULFATE 1 MG/ML IJ SOLN
INTRAMUSCULAR | Status: AC
  Filled 2011-03-03: qty 10

## 2011-03-03 MED ORDER — DEXAMETHASONE SODIUM PHOSPHATE 10 MG/ML IJ SOLN
INTRAMUSCULAR | Status: DC | PRN
  Administered 2011-03-03: 10 mg via INTRAVENOUS

## 2011-03-03 MED ORDER — MIDAZOLAM HCL 5 MG/5ML IJ SOLN
INTRAMUSCULAR | Status: DC | PRN
  Administered 2011-03-03: 2 mg via INTRAVENOUS

## 2011-03-03 MED ORDER — OXYCODONE-ACETAMINOPHEN 5-325 MG PO TABS: 1.0000 | ORAL_TABLET | ORAL | Status: DC | PRN

## 2011-03-03 MED ORDER — OXYCODONE-ACETAMINOPHEN 5-325 MG PO TABS
1.0000 | ORAL_TABLET | ORAL | Status: DC | PRN
  Administered 2011-03-03: 1 via ORAL
  Filled 2011-03-03: qty 1

## 2011-03-03 MED ORDER — FENTANYL CITRATE 0.05 MG/ML IJ SOLN
INTRAMUSCULAR | Status: DC | PRN
  Administered 2011-03-03: 100 ug via INTRAVENOUS
  Administered 2011-03-03: 50 ug via INTRAVENOUS
  Administered 2011-03-03: 100 ug via INTRAVENOUS

## 2011-03-03 MED ORDER — FENTANYL CITRATE 0.05 MG/ML IJ SOLN
25.0000 ug | INTRAMUSCULAR | Status: DC | PRN
  Administered 2011-03-03 (×2): 50 ug via INTRAVENOUS

## 2011-03-03 SURGICAL SUPPLY — 76 items
ADH SKN CLS APL DERMABOND .7 (GAUZE/BANDAGES/DRESSINGS) ×1
APL SKNCLS STERI-STRIP NONHPOA (GAUZE/BANDAGES/DRESSINGS)
BAG URINE DRAINAGE (UROLOGICAL SUPPLIES) ×2 IMPLANT
BARRIER ADHS 3X4 INTERCEED (GAUZE/BANDAGES/DRESSINGS) ×2 IMPLANT
BENZOIN TINCTURE PRP APPL 2/3 (GAUZE/BANDAGES/DRESSINGS) ×1 IMPLANT
BLADE LAP MORCELLATOR 15X9.5 (ELECTROSURGICAL) ×1 IMPLANT
BLADE LAPAROSCOPIC MORCELL KIT (BLADE) ×2 IMPLANT
BLADELESS LONG 8MM (BLADE) ×2 IMPLANT
BRR ADH 4X3 ABS CNTRL BYND (GAUZE/BANDAGES/DRESSINGS) ×1
CANNULA SEAL DVNC (CANNULA) ×3 IMPLANT
CANNULA SEALS DA VINCI (CANNULA)
CATH FOLEY 3WAY  5CC 16FR (CATHETERS) ×1
CATH FOLEY 3WAY  5CC 18FR (CATHETERS)
CATH FOLEY 3WAY 5CC 16FR (CATHETERS) ×1 IMPLANT
CATH FOLEY 3WAY 5CC 18FR (CATHETERS) ×1 IMPLANT
CHLORAPREP W/TINT 26ML (MISCELLANEOUS) ×2 IMPLANT
CLOTH BEACON ORANGE TIMEOUT ST (SAFETY) ×2 IMPLANT
CONT PATH 16OZ SNAP LID 3702 (MISCELLANEOUS) ×2 IMPLANT
COVER MAYO STAND STRL (DRAPES) ×2 IMPLANT
COVER TABLE BACK 60X90 (DRAPES) ×4 IMPLANT
COVER TIP SHEARS 8 DVNC (MISCELLANEOUS) ×1 IMPLANT
COVER TIP SHEARS 8MM DA VINCI (MISCELLANEOUS) ×1
DECANTER SPIKE VIAL GLASS SM (MISCELLANEOUS) ×3 IMPLANT
DERMABOND ADVANCED (GAUZE/BANDAGES/DRESSINGS) ×1
DERMABOND ADVANCED .7 DNX12 (GAUZE/BANDAGES/DRESSINGS) ×1 IMPLANT
DRAPE HUG U DISPOSABLE (DRAPE) ×2 IMPLANT
DRAPE LG THREE QUARTER DISP (DRAPES) ×4 IMPLANT
DRAPE MONITOR DA VINCI (DRAPE) IMPLANT
DRAPE PROXIMA HALF (DRAPES) ×2 IMPLANT
DRAPE WARM FLUID 44X44 (DRAPE) ×2 IMPLANT
ELECT REM PT RETURN 9FT ADLT (ELECTROSURGICAL) ×2
ELECTRODE REM PT RTRN 9FT ADLT (ELECTROSURGICAL) ×1 IMPLANT
EVACUATOR SMOKE 8.L (FILTER) ×2 IMPLANT
GAUZE VASELINE 3X9 (GAUZE/BANDAGES/DRESSINGS) IMPLANT
GLOVE BIOGEL PI IND STRL 7.0 (GLOVE) ×3 IMPLANT
GLOVE BIOGEL PI INDICATOR 7.0 (GLOVE) ×3
GLOVE ECLIPSE 6.5 STRL STRAW (GLOVE) ×6 IMPLANT
GOWN STRL REIN XL XLG (GOWN DISPOSABLE) ×12 IMPLANT
IV SET ADMIN PUMP GEMINI W/NLD (IV SETS) ×1 IMPLANT
KIT ABG SYR 3ML LUER SLIP (SYRINGE) ×2 IMPLANT
KIT ACCESSORY DA VINCI DISP (KITS) ×1
KIT ACCESSORY DVNC DISP (KITS) ×1 IMPLANT
KIT DISP ACCESSORY 4 ARM (KITS) IMPLANT
MANIPULATOR UTERINE 4.5 ZUMI (MISCELLANEOUS) IMPLANT
NDL INSUFFLATION 14GA 120MM (NEEDLE) ×1 IMPLANT
NEEDLE HYPO 22GX1.5 SAFETY (NEEDLE) ×2 IMPLANT
NEEDLE INSUFFLATION 14GA 120MM (NEEDLE) ×2 IMPLANT
NS IRRIG 1000ML POUR BTL (IV SOLUTION) ×6 IMPLANT
OCCLUDER COLPOPNEUMO (BALLOONS) ×1 IMPLANT
PACK LAVH (CUSTOM PROCEDURE TRAY) ×2 IMPLANT
POSITIONER SURGICAL ARM (MISCELLANEOUS) ×2 IMPLANT
SET CYSTO W/LG BORE CLAMP LF (SET/KITS/TRAYS/PACK) ×1 IMPLANT
SET IRRIG TUBING LAPAROSCOPIC (IRRIGATION / IRRIGATOR) ×2 IMPLANT
SOLUTION ELECTROLUBE (MISCELLANEOUS) ×2 IMPLANT
SPONGE LAP 18X18 X RAY DECT (DISPOSABLE) IMPLANT
STRIP CLOSURE SKIN 1/2X4 (GAUZE/BANDAGES/DRESSINGS) ×1 IMPLANT
SUT MNCRL AB 3-0 PS2 27 (SUTURE) ×4 IMPLANT
SUT VIC AB 0 CT1 27 (SUTURE) ×12
SUT VIC AB 0 CT1 27XBRD ANTBC (SUTURE) ×6 IMPLANT
SUT VIC AB 0 CT2 27 (SUTURE) IMPLANT
SUT VIC AB 2-0 CT2 27 (SUTURE) ×2 IMPLANT
SUT VICRYL 0 UR6 27IN ABS (SUTURE) ×4 IMPLANT
SYR 50ML LL SCALE MARK (SYRINGE) ×2 IMPLANT
SYRINGE 10CC LL (SYRINGE) ×1 IMPLANT
SYSTEM CONVERTIBLE TROCAR (TROCAR) ×1 IMPLANT
TIP UTERINE 5.1X6CM LAV DISP (MISCELLANEOUS) IMPLANT
TIP UTERINE 6.7X10CM GRN DISP (MISCELLANEOUS) IMPLANT
TIP UTERINE 6.7X6CM WHT DISP (MISCELLANEOUS) IMPLANT
TIP UTERINE 6.7X8CM BLUE DISP (MISCELLANEOUS) ×1 IMPLANT
TOWEL OR 17X24 6PK STRL BLUE (TOWEL DISPOSABLE) ×6 IMPLANT
TROCAR 12M 150ML BLUNT (TROCAR) ×1 IMPLANT
TROCAR DISP BLADELESS 8 DVNC (TROCAR) ×1 IMPLANT
TROCAR DISP BLADELESS 8MM (TROCAR) ×1
TROCAR XCEL 12X100 BLDLESS (ENDOMECHANICALS) ×2 IMPLANT
TROCAR Z-THREAD BLADED 12X100M (TROCAR) IMPLANT
TUBING FILTER THERMOFLATOR (ELECTROSURGICAL) ×2 IMPLANT

## 2011-03-03 NOTE — Anesthesia Procedure Notes (Signed)
Date/Time: 03/03/2011 7:50 AM Performed by: Karleen Dolphin Pre-anesthesia Checklist: Suction available, Emergency Drugs available, Timeout performed, Patient identified and Patient being monitored Patient Re-evaluated:Patient Re-evaluated prior to inductionOxygen Delivery Method: Circle System Utilized Preoxygenation: Pre-oxygenation with 100% oxygen Intubation Type: IV induction Ventilation: Mask ventilation without difficulty Laryngoscope Size: 3 Grade View: Grade I Number of attempts: 1 Airway Equipment and Method: stylet Placement Confirmation: ETT inserted through vocal cords under direct vision,  breath sounds checked- equal and bilateral and positive ETCO2 Secured at: 21 cm Tube secured with: Tape Dental Injury: Teeth and Oropharynx as per pre-operative assessment

## 2011-03-03 NOTE — Anesthesia Preprocedure Evaluation (Addendum)
Anesthesia Evaluation  Patient identified by MRN, date of birth, ID band Patient awake    Reviewed: Allergy & Precautions, H&P , NPO status , Patient's Chart, lab work & pertinent test results  Airway Mallampati: II TM Distance: >3 FB Neck ROM: full    Dental No notable dental hx. (+) Teeth Intact   Pulmonary asthma ,  clear to auscultation  Pulmonary exam normal       Cardiovascular neg cardio ROS regular Normal    Neuro/Psych Negative Neurological ROS     GI/Hepatic negative GI ROS, Neg liver ROS,   Endo/Other  Morbid obesity  Renal/GU negative Renal ROS  Genitourinary negative   Musculoskeletal   Abdominal Normal abdominal exam  (+)   Peds  Hematology negative hematology ROS (+)   Anesthesia Other Findings   Reproductive/Obstetrics negative OB ROS                           Anesthesia Physical Anesthesia Plan  ASA: II  Anesthesia Plan: General ETT   Post-op Pain Management:    Induction:   Airway Management Planned:   Additional Equipment:   Intra-op Plan:   Post-operative Plan:   Informed Consent: I have reviewed the patients History and Physical, chart, labs and discussed the procedure including the risks, benefits and alternatives for the proposed anesthesia with the patient or authorized representative who has indicated his/her understanding and acceptance.   Dental Advisory Given  Plan Discussed with: Anesthesiologist, Surgeon and CRNA  Anesthesia Plan Comments:        Anesthesia Quick Evaluation

## 2011-03-03 NOTE — Discharge Summary (Signed)
Physician Discharge Summary  Patient ID: Julia Shaw MRN: [id number] DOB/AGE: 1975/05/11 35 y.o.  Admit date: 03/03/2011 Discharge date: 03/03/2011  Admission Diagnoses: Fibroid  Discharge Diagnoses: Fibroid        Active Problems:  Fibroid, uterus   Discharged Condition: Stable  Hospital Course: On the date of admission, the patient underwent a Robot assisted Laparoscopic Myomectomy with Fibroid Morcellation, tolerating procedure well.  Post operative course was unremarkable with patient resuming bowel/bladder function, ambulation and adequate analgesia prior to discharge on day of surgery.  Disposition: Home or Self Care   Medication List  As of 03/03/2011 10:28 AM   TAKE these medications         docusate sodium 100 MG capsule   Commonly known as: COLACE   Take 1 capsule (100 mg total) by mouth 2 (two) times daily. To prevent constipation      ibuprofen 600 MG tablet   Commonly known as: ADVIL,MOTRIN   Take 1 tablet (600 mg total) by mouth every 6 (six) hours. Always take with food.      oxyCODONE-acetaminophen 5-325 MG per tablet   Commonly known as: PERCOCET   Take 1 tablet by mouth every 4 (four) hours as needed for pain.      promethazine 12.5 MG tablet   Commonly known as: PHENERGAN   Take 1 tablet (12.5 mg total) by mouth every 6 (six) hours as needed for nausea.           Follow-up Information    Follow up with [facility], MD on 03/17/2011. (Appointment time is 9:45 a.m.)    Contact information:   [address] [place] [address] [phone]          Signed: Patrick Jupiter 03/03/2011, 10:28 AM

## 2011-03-03 NOTE — Transfer of Care (Signed)
Immediate Anesthesia Transfer of Care Note  Patient: Julia Shaw  Procedure(s) Performed:  ROBOTIC ASSISTED MYOMECTOMY  Patient Location: PACU  Anesthesia Type: General  Level of Consciousness: awake, alert  and oriented  Airway & Oxygen Therapy: Patient Spontanous Breathing and Patient connected to nasal cannula oxygen  Post-op Assessment: Report given to PACU RN and Post -op Vital signs reviewed and stable  Post vital signs: Reviewed and stable  Complications: No apparent anesthesia complications

## 2011-03-03 NOTE — Progress Notes (Addendum)
D/C instructions reviewed with pt.  Pt. States understanding.  No home equipment needed.  Wheelchair to car with staff without incident.  D/C'd home with family. 

## 2011-03-03 NOTE — Addendum Note (Signed)
Addendum  created 03/03/11 1336 by Fanny Dance, CRNA   Modules edited:Notes Section

## 2011-03-03 NOTE — Interval H&P Note (Signed)
History and Physical Interval Note:  03/03/2011 7:27 AM  Julia Shaw  has presented today for surgery, with the diagnosis of Fibroid  The various methods of treatment have been discussed with the patient and family. After consideration of risks, benefits and other options for treatment, the patient has consented to  Procedure(s): ROBOTIC ASSISTED MYOMECTOMY as a surgical intervention .  The patients' history has been reviewed, patient examined, no change in status, stable for surgery.  I have reviewed the patients' chart and labs.  Questions were answered to the patient's satisfaction.     [name]

## 2011-03-03 NOTE — Op Note (Signed)
Preoperative diagnosis: Uterine fibroid  Postoperative diagnosis: Same  Anesthesia: Gen.  Anesthesiologist: Dr. Brayton Caves  Procedure: Robotic myomectomy  Surgeon: Dr. Dois Davenport Kaitelyn Jamison  Assistant: Henreitta Leber PA-C  Procedure:  After being informed of the planned procedure with possible complications including bleeding, infection, injury to other organs as well as growth of new fibroid post operatively, informed consent is obtained and patient is taken to OR #7. She is given general anesthesia with endotracheal intubation without any complication. She is placed in the lithotomy position on the bean bag mattress with both arms padded and tucked on each side and knee-high sequential compressive devices. She's prepped and draped in the style fashion and a Foley catheter is inserted in her bladder. A speculum is inserted and anterior lip of the cervix was grasped with tenaculum forceps. The uterus was sounded at 9 cm. The cervix was easily dilated using Hegar dilator until #27 which allows VC placement of a #8 roomy intrauterine manipulator. Its balloon is inflated with 10 cc of saline. The tenaculum is changed for a Jacobs forceps.  We infiltrate supraumbilically with Marcaine 0.25 and perform a 10 mm vertical incision which is brought down bluntly to the fascia. The fascia is grasped with Coker forceps and incised with Mayo scissors. Peritoneum is entered bluntly. A purse string suture of 0 Vicryl is placed on the fascia and a 10 mm Hassan trocar is easily positioned and held in placed with purse string suture. This allowed for easy insufflation of the pneumoperitoneum with warm to CO2 at a maximum pressure of 15 mm of mercury.  Observation:  The uterus has a 5.5 cm anterior left cornual subserosal fibroid as previously described on ultrasound. Both tubes and both ovaries are normal. Anterior and posterior cul-de-sac were free. There is a small adhesion between the omentum and the umbilicus.  Liver and gallbladder I visualized the normal. Appendix is not visualized.  Trocar placement is decided and we position easily to 8 mm robotic trocar on the left and one 8 mm robotic trocar on the right all under direct position after infiltrating with Marcaine 0.25. We then positioned a 10 mm patient side assistant trocar on the right after infiltrating with Marcaine 0.25. We docked the robot and place a monopolar scissor and arm #1, a gyrus PK forceps and arm #2 and a tenaculum and arm #3.   We experienced difficulty with positioning of the patient who measures only 5 feet. Because of her low position on the table we are unable to obtain adequate Trendelenburg position. We placed 2 blankets under her buttocks to elevate her pelvis which corrects the problem. So preparation and docking is completed in 45 minutes.  We start with a dressing the adhesion between the omentum and the umbilicus using monopolar scissors. We then positioned the uterus to have a good position on the anterior aspect of the fibroid. We infiltrate the serosa of the fibroid with vasopressin diluted 20 units in 50 cc of saline. Using open monopolar scissor we incised the serosa on a 4 cm distance. We proceed until we can identify the fibroid which was then grasped with a tenaculum forceps. We then systematically dissect the fibroid from the uterus using traction and countertraction as well as sharp and blunt dissection. The fibroid is deposited in the anterior cul-de-sac and instruments are modified for a needle holder and arm #2 and the suture cut and arm number one. The myometrial defect was repaired in 3 layers using 0 V-Loc. We then closed  the serosa with a baseball stitch of 20 V-Loc. Hemostasis was deemed adequate. Console time was 45 minutes.  A half sheet of Interceed is placed on the myometrial defect. The robot is undocked. The 10 mm patient side assistant trocar is removed and replaced by the morcellator. Morcellation of the  specimen is achieved in 10 minutes.  All trochars had been removed after evacuating the pneumoperitoneum. The supraumbilical fascia is closed with the previously placed pursestring suture of 0 Vicryl. The fascia of the right side incision is closed with a figure-of-eight stitch of 0 Vicryl. The skin of all incisions was closed with subcuticular suture of 3-0 Monocryl and Dermabond.  The intrauterine manipulator is removed as well as the Jacobs forceps on the cervix. Bleeding on the anterior lip of the cervix is controlled with a figure-of-eight stitch of 2-0 Vicryl.  Instrument and sponge count is complete x2. Estimated blood loss is minimal. The procedure is well tolerated by the patient who is taken to the recovery room in a well and stable condition and will be discharged today.  Specimen: Uterine fibroid sent to pathology

## 2011-03-03 NOTE — Anesthesia Postprocedure Evaluation (Signed)
  Anesthesia Post-op Note  Patient: Julia Shaw  Procedure(s) Performed:  ROBOTIC ASSISTED MYOMECTOMY  Patient Location: [facility]'s Unit  Anesthesia Type: General  Level of Consciousness: oriented and sedated  Airway and Oxygen Therapy: Patient Spontanous Breathing  Post-op Pain: mild  Post-op Assessment: Patient's Cardiovascular Status Stable and Respiratory Function Stable  Post-op Vital Signs: stable  Complications: No apparent anesthesia complications

## 2011-03-03 NOTE — Anesthesia Postprocedure Evaluation (Signed)
Anesthesia Post Note  Patient: Julia Shaw  Procedure(s) Performed:  ROBOTIC ASSISTED MYOMECTOMY  Anesthesia type: GA  Patient location: PACU  Post pain: Pain level controlled  Post assessment: Post-op Vital signs reviewed  Last Vitals:  Filed Vitals:   03/03/11 0623  BP: 113/65  Pulse: 79  Temp: 36.8 C  Resp: 16    Post vital signs: Reviewed  Level of consciousness: sedated  Complications: No apparent anesthesia complications

## 2011-03-03 NOTE — Brief Op Note (Cosign Needed)
03/03/2011  10:12 AM  PATIENT:  Julia Shaw  36 y.o. female  PRE-OPERATIVE DIAGNOSIS:  Fibroid  POST-OPERATIVE DIAGNOSIS:  Fibroid    Procedure(s): ROBOTIC ASSISTED MYOMECTOMY    Surgeon(s): Esmeralda Arthur, MD   ASSISTANT: Henreitta Leber PA-C  ANESTHESIA:   local and general  LOCAL MEDICATIONS USED: .25%  MARCAINE 20 CC  SPECIMEN:  fibroid  DISPOSITION OF SPECIMEN:  PATHOLOGY  COUNTS:  YES  ESTIMATED BLOOD LOSS: 25 cc Urine output: 50 cc IV Fluids: 1300 cc  PATIENT DISPOSITION:  PACU - hemodynamically stable.   DICTATION #Henreitta Leber, PA-C 03/03/2011 10:12 AM

## 2011-03-03 NOTE — Brief Op Note (Signed)
03/03/2011  10:24 AM  PATIENT:  Julia Shaw  36 y.o. female  PRE-OPERATIVE DIAGNOSIS:  Fibroid  POST-OPERATIVE DIAGNOSIS:  Fibroid    Procedure(s): ROBOTIC ASSISTED MYOMECTOMY    Surgeon(s): Esmeralda Arthur, MD   ASSISTANTS: Henreitta Leber PA-C   ANESTHESIA:   general  LOCAL MEDICATIONS USED:  MARCAINE 20 CC  SPECIMEN:  uterine fibroid  DISPOSITION OF SPECIMEN:  PATHOLOGY  COUNTS:  YES  ESTIMATED BLOOD LOSS: minimal  PATIENT DISPOSITION:  PACU - hemodynamically stable.       [facility] 03/03/2011 10:24 AM

## 2011-03-04 ENCOUNTER — Encounter (HOSPITAL_COMMUNITY): Payer: Self-pay | Admitting: Obstetrics and Gynecology

## 2011-03-04 NOTE — Addendum Note (Signed)
Addendum  created 03/04/11 0927 by Jenell Dobransky L. Rodman Pickle, MD   Modules edited:Charting, Inpatient Notes

## 2011-03-10 DIAGNOSIS — L80 Vitiligo: Secondary | ICD-10-CM | POA: Diagnosis not present

## 2011-03-16 DIAGNOSIS — D259 Leiomyoma of uterus, unspecified: Secondary | ICD-10-CM | POA: Diagnosis not present

## 2011-03-16 DIAGNOSIS — N760 Acute vaginitis: Secondary | ICD-10-CM | POA: Diagnosis not present

## 2011-04-09 ENCOUNTER — Inpatient Hospital Stay (HOSPITAL_COMMUNITY)
Admission: AD | Admit: 2011-04-09 | Discharge: 2011-04-09 | Disposition: A | Discharge status: Home / Self Care | Payer: Medicare Other | Source: Ambulatory Visit | Attending: Obstetrics and Gynecology | Admitting: Obstetrics and Gynecology

## 2011-04-09 ENCOUNTER — Encounter (HOSPITAL_COMMUNITY): Payer: Self-pay

## 2011-04-09 DIAGNOSIS — N760 Acute vaginitis: Secondary | ICD-10-CM | POA: Insufficient documentation

## 2011-04-09 DIAGNOSIS — N949 Unspecified condition associated with female genital organs and menstrual cycle: Secondary | ICD-10-CM | POA: Diagnosis not present

## 2011-04-09 DIAGNOSIS — B9689 Other specified bacterial agents as the cause of diseases classified elsewhere: Secondary | ICD-10-CM | POA: Insufficient documentation

## 2011-04-09 DIAGNOSIS — A499 Bacterial infection, unspecified: Secondary | ICD-10-CM | POA: Insufficient documentation

## 2011-04-09 LAB — WET PREP, GENITAL

## 2011-04-09 MED ORDER — CLINDAMYCIN HCL 300 MG PO CAPS: 300.0000 mg | ORAL_CAPSULE | Freq: Two times a day (BID) | ORAL | Status: AC

## 2011-04-09 MED ORDER — FLUCONAZOLE 150 MG PO TABS: 150.0000 mg | ORAL_TABLET | Freq: Once | ORAL | Status: AC

## 2011-04-09 NOTE — Discharge Instructions (Signed)
Bacterial Vaginosis Bacterial vaginosis (BV) is a vaginal infection where the normal balance of bacteria in the vagina is disrupted. The normal balance is then replaced by an overgrowth of certain bacteria. There are several different kinds of bacteria that can cause BV. BV is the most common vaginal infection in women of childbearing age. CAUSES   The cause of BV is not fully understood. BV develops when there is an increase or imbalance of harmful bacteria.   Some activities or behaviors can upset the normal balance of bacteria in the vagina and put women at increased risk including:   Having a new sex partner or multiple sex partners.   Douching.   Using an intrauterine device (IUD) for contraception.   It is not clear what role sexual activity plays in the development of BV. However, women that have never had sexual intercourse are rarely infected with BV.  Women do not get BV from toilet seats, bedding, swimming pools or from touching objects around them.  SYMPTOMS   Grey vaginal discharge.   A fish-like odor with discharge, especially after sexual intercourse.   Itching or burning of the vagina and vulva.   Burning or pain with urination.   Some women have no signs or symptoms at all.  DIAGNOSIS  Your caregiver must examine the vagina for signs of BV. Your caregiver will perform lab tests and look at the sample of vaginal fluid through a microscope. They will look for bacteria and abnormal cells (clue cells), a pH test higher than 4.5, and a positive amine test all associated with BV.  RISKS AND COMPLICATIONS   Pelvic inflammatory disease (PID).   Infections following gynecology surgery.   Developing HIV.   Developing herpes virus.  TREATMENT  Sometimes BV will clear up without treatment. However, all women with symptoms of BV should be treated to avoid complications, especially if gynecology surgery is planned. Female partners generally do not need to be treated. However,  BV may spread between female sex partners so treatment is helpful in preventing a recurrence of BV.   BV may be treated with antibiotics. The antibiotics come in either pill or vaginal cream forms. Either can be used with nonpregnant or pregnant women, but the recommended dosages differ. These antibiotics are not harmful to the baby.   BV can recur after treatment. If this happens, a second round of antibiotics will often be prescribed.   Treatment is important for pregnant women. If not treated, BV can cause a premature delivery, especially for a pregnant woman who had a premature birth in the past. All pregnant women who have symptoms of BV should be checked and treated.   For chronic reoccurrence of BV, treatment with a type of prescribed gel vaginally twice a week is helpful.  HOME CARE INSTRUCTIONS   Finish all medication as directed by your caregiver.   Do not have sex until treatment is completed.   Tell your sexual partner that you have a vaginal infection. They should see their caregiver and be treated if they have problems, such as a mild rash or itching.   Practice safe sex. Use condoms. Only have 1 sex partner.  PREVENTION  Basic prevention steps can help reduce the risk of upsetting the natural balance of bacteria in the vagina and developing BV:  Do not have sexual intercourse (be abstinent).   Do not douche.   Use all of the medicine prescribed for treatment of BV, even if the signs and symptoms go away.     Tell your sex partner if you have BV. That way, they can be treated, if needed, to prevent reoccurrence.  SEEK MEDICAL CARE IF:   Your symptoms are not improving after 3 days of treatment.   You have increased discharge, pain, or fever.  MAKE SURE YOU:   Understand these instructions.   Will watch your condition.   Will get help right away if you are not doing well or get worse.  FOR MORE INFORMATION  Division of STD Prevention (DSTDP), Centers for Disease  Control and Prevention: www.cdc.gov/std American Social Health Association (ASHA): www.ashastd.org  Document Released: 01/18/2005 Document Revised: 01/07/2011 Document Reviewed: 07/11/2008 Exit[employer] Patient Information 2012 [employer]. 

## 2011-04-09 NOTE — Progress Notes (Signed)
Patient states she has had a vaginal discharge with an odor foe 2-3 days. Irritation, no pain or bleeding.

## 2011-04-09 NOTE — Progress Notes (Signed)
Patient does report that she had surgery on 02/04/11 to remove a fibroid has not had intercourse since.

## 2011-04-14 ENCOUNTER — Encounter (INDEPENDENT_AMBULATORY_CARE_PROVIDER_SITE_OTHER): Payer: Medicare Other | Admitting: Obstetrics and Gynecology

## 2011-04-14 DIAGNOSIS — N760 Acute vaginitis: Secondary | ICD-10-CM | POA: Diagnosis not present

## 2011-04-14 DIAGNOSIS — N898 Other specified noninflammatory disorders of vagina: Secondary | ICD-10-CM | POA: Diagnosis not present

## 2011-04-14 DIAGNOSIS — Z20828 Contact with and (suspected) exposure to other viral communicable diseases: Secondary | ICD-10-CM | POA: Diagnosis not present

## 2011-06-03 ENCOUNTER — Encounter (HOSPITAL_COMMUNITY): Payer: Self-pay | Admitting: Emergency Medicine

## 2011-06-03 ENCOUNTER — Emergency Department (INDEPENDENT_AMBULATORY_CARE_PROVIDER_SITE_OTHER): Payer: Medicare Other

## 2011-06-03 ENCOUNTER — Emergency Department (INDEPENDENT_AMBULATORY_CARE_PROVIDER_SITE_OTHER)
Admission: EM | Admit: 2011-06-03 | Discharge: 2011-06-03 | Disposition: A | Discharge status: Home / Self Care | Payer: Medicare Other | Source: Home / Self Care | Attending: Family Medicine | Admitting: Family Medicine

## 2011-06-03 ENCOUNTER — Emergency Department (HOSPITAL_COMMUNITY): Payer: Medicare Other

## 2011-06-03 DIAGNOSIS — J45909 Unspecified asthma, uncomplicated: Secondary | ICD-10-CM | POA: Diagnosis not present

## 2011-06-03 DIAGNOSIS — R05 Cough: Secondary | ICD-10-CM | POA: Diagnosis not present

## 2011-06-03 DIAGNOSIS — R0602 Shortness of breath: Secondary | ICD-10-CM

## 2011-06-03 MED ORDER — ALBUTEROL SULFATE (5 MG/ML) 0.5% IN NEBU
5.0000 mg | INHALATION_SOLUTION | Freq: Once | RESPIRATORY_TRACT | Status: AC
  Administered 2011-06-03: 5 mg via RESPIRATORY_TRACT

## 2011-06-03 MED ORDER — IPRATROPIUM BROMIDE 0.02 % IN SOLN
0.5000 mg | Freq: Once | RESPIRATORY_TRACT | Status: AC
  Administered 2011-06-03: 0.5 mg via RESPIRATORY_TRACT

## 2011-06-03 MED ORDER — METHYLPREDNISOLONE SODIUM SUCC 125 MG IJ SOLR
INTRAMUSCULAR | Status: AC
  Filled 2011-06-03: qty 2

## 2011-06-03 MED ORDER — ALBUTEROL SULFATE (5 MG/ML) 0.5% IN NEBU
INHALATION_SOLUTION | RESPIRATORY_TRACT | Status: AC
  Filled 2011-06-03: qty 1

## 2011-06-03 MED ORDER — ALBUTEROL SULFATE HFA 108 (90 BASE) MCG/ACT IN AERS: 1.0000 | INHALATION_SPRAY | Freq: Four times a day (QID) | RESPIRATORY_TRACT | Status: DC | PRN

## 2011-06-03 MED ORDER — METHYLPREDNISOLONE SODIUM SUCC 125 MG IJ SOLR
125.0000 mg | Freq: Once | INTRAMUSCULAR | Status: AC
  Administered 2011-06-03: 125 mg via INTRAMUSCULAR

## 2011-06-03 MED ORDER — METHYLPREDNISOLONE 4 MG PO KIT: PACK | ORAL | Status: AC

## 2011-06-03 NOTE — ED Provider Notes (Signed)
History     CSN: [id number]  Arrival date & time 06/03/11  1416   First MD Initiated Contact with Patient 06/03/11 1512      Chief Complaint  Patient presents with  . Bronchitis  . Shortness of Breath    (Consider location/radiation/quality/duration/timing/severity/associated sxs/prior treatment) Patient is a 36 y.o. female presenting with shortness of breath. The history is provided by the patient.  Shortness of Breath  The current episode started 3 to 5 days ago. The onset was gradual. The problem has been gradually worsening. The problem is moderate. Associated symptoms include rhinorrhea, cough, shortness of breath and wheezing. Pertinent negatives include no chest pain and no fever. Her past medical history is significant for asthma.    Past Medical History  Diagnosis Date  . Asthma   . Bronchitis   . Fibroid     Past Surgical History  Procedure Date  . Tubal ligation   . Dental surgery   . Bunionectomy     right foot  . Robot assisted myomectomy 03/03/2011    Procedure: ROBOTIC ASSISTED MYOMECTOMY;  Surgeon: Esmeralda Arthur, MD;  Location: [facility];  Service: Gynecology;  Laterality: N/A;    No family history on file.  History  Substance Use Topics  . Smoking status: Never Smoker   . Smokeless tobacco: Never Used  . Alcohol Use: No    OB History    Grav Para Term Preterm Abortions TAB SAB Ect Mult Living   3 2 2  1  1   2       Review of Systems  Constitutional: Negative for fever.  HENT: Positive for congestion, rhinorrhea and postnasal drip.   Respiratory: Positive for cough, shortness of breath and wheezing.   Cardiovascular: Negative for chest pain.  Gastrointestinal: Negative.     Allergies  Review of patient's allergies indicates no known allergies.  Home Medications   Current Outpatient Rx  Name Route Sig Dispense Refill  . ALBUTEROL SULFATE HFA 108 (90 BASE) MCG/ACT IN AERS Inhalation Inhale 1-2 puffs into the lungs every 6 (six) hours as  needed for wheezing. 1 Inhaler 0  . METHYLPREDNISOLONE 4 MG PO KIT  follow package directions, start on fri, take until finished. 21 tablet 0    BP 125/75  Pulse 102  Temp(Src) 98.5 F (36.9 C) (Oral)  Resp 18  SpO2 96%  Physical Exam  Nursing note and vitals reviewed. Constitutional: She is oriented to person, place, and time. She appears well-developed and well-nourished.  HENT:  Head: Normocephalic.  Right Ear: External ear normal.  Left Ear: External ear normal.  Nose: Nose normal.  Mouth/Throat: Oropharynx is clear and moist.  Eyes: Pupils are equal, round, and reactive to light.  Neck: Normal range of motion. Neck supple.  Cardiovascular: Normal rate, regular rhythm, normal heart sounds and intact distal pulses.   Pulmonary/Chest: She has wheezes. She has rhonchi.  Lymphadenopathy:    She has no cervical adenopathy.  Neurological: She is alert and oriented to person, place, and time.    ED Course  Procedures (including critical care time)  Labs Reviewed - No data to display Dg Chest 2 View  06/03/2011  *RADIOLOGY REPORT*  Clinical Data: Shortness of breath and cough.  CHEST - 2 VIEW  Comparison: 05/04/2005.  Findings: The cardiac silhouette, mediastinal and hilar contours are within normal limits and stable.  The lungs are clear.  No pleural effusion or pneumothorax. The bony thorax is intact.  IMPRESSION: No acute cardiopulmonary findings.  Original Report Authenticated By: P. Loralie Champagne, M.D.     1. Bronchitis, allergic   2. Asthma       MDM  X-rays reviewed and report per radiologist.  Sx improved and lungs clear after treatment.         Linna Hoff, MD 06/03/11 [phone]

## 2011-06-03 NOTE — ED Notes (Signed)
PT HERE WITH CONSTANT DRY COUGH WITH SOB THAT WORSENS AT NIGHT AND WHEEZING UNRELIEVED BY TUSSINEX  AND COLD MEDS.SX STARTED X 3 DYS AGO BUT GETTING WORSE WITH DIFF SLEEPING.HX ASTHMA,BRONCHITIS.NONSMOKER

## 2011-06-03 NOTE — Discharge Instructions (Signed)
Use medicine as prescribed, drink plenty of fluids, see your doctor if further problems.

## 2011-06-03 NOTE — ED Notes (Signed)
Pt     Has  A  Pain   Scale     Of  2    Worse  When     She  Coughs

## 2011-07-12 ENCOUNTER — Emergency Department (INDEPENDENT_AMBULATORY_CARE_PROVIDER_SITE_OTHER)
Admission: EM | Admit: 2011-07-12 | Discharge: 2011-07-12 | Disposition: A | Discharge status: Home / Self Care | Payer: Medicare Other | Source: Home / Self Care | Attending: Family Medicine | Admitting: Family Medicine

## 2011-07-12 ENCOUNTER — Encounter (HOSPITAL_COMMUNITY): Payer: Self-pay

## 2011-07-12 DIAGNOSIS — J45901 Unspecified asthma with (acute) exacerbation: Secondary | ICD-10-CM | POA: Diagnosis not present

## 2011-07-12 MED ORDER — ALBUTEROL SULFATE HFA 108 (90 BASE) MCG/ACT IN AERS: 1.0000 | INHALATION_SPRAY | Freq: Four times a day (QID) | RESPIRATORY_TRACT | Status: DC | PRN

## 2011-07-12 MED ORDER — PREDNISONE 20 MG PO TABS: ORAL_TABLET | ORAL | Status: AC

## 2011-07-12 NOTE — Discharge Instructions (Signed)
Asthma, Adult  Asthma is a disease of the lungs and can make it hard to breathe. Asthma cannot be cured, but medicine can help control it. Asthma may be started (triggered) by:   Pollen.   Dust.   Animal skin flakes (dander).   Molds.   Foods.   Respiratory infections (colds, flu).   Smoke.   Exercise.   Stress.   Other things that cause allergic reactions or allergies (allergens).  HOME CARE    Talk to your doctor about how to manage your attacks at home. This may include:   Using a tool called a peak flow meter.   Having medicine ready to stop the attack.   Take all medicine as told by your doctor.   Wash bed sheets and blankets every week in hot water and put them in the dryer.   Drink enough fluids to keep your pee (urine) clear or pale yellow.   Always be ready to get emergency help. Write down the phone number for your doctor. Keep it where you can easily find it.   Talk about exercise routines with your doctor.   If animal dander is causing your asthma, you may need to find a new home for your pet(s).  GET HELP RIGHT AWAY IF:    You have muscle aches.   You cough more.   You have chest pain.   You have thick spit (sputum) that changes to yellow, green, gray, or bloody.   Medicine does not stop your wheezing.   You have problems breathing.   You have a fever.   Your medicine causes:   A rash.   Itching.   Puffiness (swelling).   Breathing problems.  MAKE SURE YOU:    Understand these instructions.   Will watch your condition.   Will get help right away if you are not doing well or get worse.  Document Released: 07/07/2007 Document Revised: 01/07/2011 Document Reviewed: 11/29/2007  Exit[employer] Patient Information 2012 [employer].

## 2011-07-12 NOTE — ED Notes (Addendum)
Out of MDI for 3 days, cough for 3 days; minimal wheezing on ascultation

## 2011-07-14 NOTE — ED Provider Notes (Signed)
History     CSN: [id number]  Arrival date & time 07/12/11  1628   First MD Initiated Contact with Patient 07/12/11 1709      Chief Complaint  Patient presents with  . Cough    (Consider location/radiation/quality/duration/timing/severity/associated sxs/prior treatment) HPI Comments: 36 y/o no smoker female h/o asthma here c/o wheezing and dry cough for 3 days ran out her inhaler. Denies fever or chills, no chest pain, denies current shortness of breath.   Past Medical History  Diagnosis Date  . Asthma   . Bronchitis   . Fibroid     Past Surgical History  Procedure Date  . Tubal ligation   . Dental surgery   . Bunionectomy     right foot  . Robot assisted myomectomy 03/03/2011    Procedure: ROBOTIC ASSISTED MYOMECTOMY;  Surgeon: Esmeralda Arthur, MD;  Location: [facility];  Service: Gynecology;  Laterality: N/A;    History reviewed. No pertinent family history.  History  Substance Use Topics  . Smoking status: Never Smoker   . Smokeless tobacco: Never Used  . Alcohol Use: No    OB History    Grav Para Term Preterm Abortions TAB SAB Ect Mult Living   3 2 2  1  1   2       Review of Systems  Constitutional: Negative for fever, chills and appetite change.  HENT: Positive for sneezing. Negative for congestion, sore throat, rhinorrhea, trouble swallowing, neck pain and voice change.   Eyes: Negative for discharge.  Respiratory: Positive for cough and wheezing.   Gastrointestinal: Negative for nausea, vomiting and abdominal pain.  Musculoskeletal: Negative for myalgias and arthralgias.  Skin: Negative for rash.  Neurological: Negative for dizziness and headaches.    Allergies  Review of patient's allergies indicates no known allergies.  Home Medications   Current Outpatient Rx  Name Route Sig Dispense Refill  . ALBUTEROL SULFATE HFA 108 (90 BASE) MCG/ACT IN AERS Inhalation Inhale 1-2 puffs into the lungs every 6 (six) hours as needed for wheezing or shortness of  breath. 1 Inhaler 1  . PREDNISONE 20 MG PO TABS  2 tabs po daily for 5 days 10 tablet 0    BP 103/61  Pulse 108  Temp 98.8 F (37.1 C) (Oral)  Resp 18  SpO2 100%  Physical Exam  Nursing note and vitals reviewed. Constitutional: She is oriented to person, place, and time. She appears well-developed and well-nourished. No distress.  HENT:  Head: Normocephalic and atraumatic.  Right Ear: External ear normal.  Left Ear: External ear normal.  Nose: Nose normal.  Mouth/Throat: Oropharynx is clear and moist. No oropharyngeal exudate.  Eyes: Conjunctivae and EOM are normal. Pupils are equal, round, and reactive to light. Right eye exhibits no discharge. Left eye exhibits no discharge.  Neck: Neck supple. No JVD present.  Pulmonary/Chest: Effort normal. She has no rales. She exhibits no tenderness.       Prolonged expiration with scattered wheezing and rhonchi bilateral. No tachypnea or orthopnea. No retractions.   Lymphadenopathy:    She has no cervical adenopathy.  Neurological: She is alert and oriented to person, place, and time.  Skin: No rash noted.    ED Course  Procedures (including critical care time)  Labs Reviewed - No data to display No results found.   1. Asthma exacerbation       MDM  No current acute respiratory distress. Prescribed prednisone. Refilled albuterol.         Brandan Robicheaux  Moreno-Coll, MD 07/15/11 1220

## 2011-07-17 ENCOUNTER — Encounter (HOSPITAL_COMMUNITY): Payer: Self-pay | Admitting: Obstetrics and Gynecology

## 2011-07-17 ENCOUNTER — Inpatient Hospital Stay (HOSPITAL_COMMUNITY)
Admission: AD | Admit: 2011-07-17 | Discharge: 2011-07-17 | Disposition: A | Discharge status: Home / Self Care | Payer: Medicare Other | Source: Ambulatory Visit | Attending: Obstetrics and Gynecology | Admitting: Obstetrics and Gynecology

## 2011-07-17 DIAGNOSIS — N760 Acute vaginitis: Secondary | ICD-10-CM | POA: Diagnosis not present

## 2011-07-17 DIAGNOSIS — B9689 Other specified bacterial agents as the cause of diseases classified elsewhere: Secondary | ICD-10-CM | POA: Insufficient documentation

## 2011-07-17 DIAGNOSIS — A499 Bacterial infection, unspecified: Secondary | ICD-10-CM | POA: Diagnosis not present

## 2011-07-17 DIAGNOSIS — L293 Anogenital pruritus, unspecified: Secondary | ICD-10-CM

## 2011-07-17 DIAGNOSIS — N949 Unspecified condition associated with female genital organs and menstrual cycle: Secondary | ICD-10-CM | POA: Insufficient documentation

## 2011-07-17 LAB — WET PREP, GENITAL: Yeast Wet Prep HPF POC: NONE SEEN

## 2011-07-17 MED ORDER — HYDROCORTISONE 1 % EX OINT: TOPICAL_OINTMENT | Freq: Two times a day (BID) | CUTANEOUS | Status: DC

## 2011-07-17 MED ORDER — METRONIDAZOLE 500 MG PO TABS: 500.0000 mg | ORAL_TABLET | Freq: Three times a day (TID) | ORAL | Status: AC

## 2011-07-17 NOTE — MAU Provider Note (Signed)
History   CSN: [id number]  Arrival date and time: 07/17/11 1718   First Provider Initiated Contact with Patient 07/17/11 1815      Chief Complaint  Patient presents with  . Vaginitis   HPI Pt presents unannounced to MAU with c/o vaginal burning, itching and pain for the past 1-2 days.  Denies any vaginal discharge.  Pt is a poor historian.  States she just finished her period this AM and she uses pads.  Reports last episode of intercourse was last week and does not use condoms.  She has not treated her symptoms.  Pt was seen in ED at [facility] for exacerbation of asthma on 07/12/11 and was treated with Prednisone and Albuterol and states her asthma symptoms have improved.  Pt with hx of frequent vaginitis symptoms.  Pt also with hx of vitiligo of the perineum and she states she has been evaluated by dermatology in the past due to this issue.    Pertinent Gynecological History: Menses: regular every 30 days without intermenstrual spotting Bleeding: menstrual only Contraception: tubal ligation DES exposure: unknown Blood transfusions: none Sexually transmitted diseases: No history and currently at risk Previous GYN Procedures: Myomectomy  Last mammogram: N/A Date: N/A Last pap: normal Date: 2012   Past Medical History  Diagnosis Date  . Asthma   . Bronchitis   . Fibroid     Past Surgical History  Procedure Date  . Tubal ligation   . Dental surgery   . Bunionectomy     right foot  . Robot assisted myomectomy 03/03/2011    Procedure: ROBOTIC ASSISTED MYOMECTOMY;  Surgeon: Esmeralda Arthur, MD;  Location: [facility];  Service: Gynecology;  Laterality: N/A;    History reviewed. No pertinent family history.  History  Substance Use Topics  . Smoking status: Never Smoker   . Smokeless tobacco: Never Used  . Alcohol Use: No    Allergies: No Known Allergies  Prescriptions prior to admission  Medication Sig Dispense Refill  . albuterol (PROVENTIL HFA;VENTOLIN HFA) 108 (90 BASE)  MCG/ACT inhaler Inhale 1-2 puffs into the lungs every 6 (six) hours as needed for wheezing or shortness of breath.  1 Inhaler  1  . predniSONE (DELTASONE) 20 MG tablet 2 tabs po daily for 5 days  10 tablet  0    Review of Systems  Constitutional: Negative.   HENT: Negative.   Eyes: Negative.   Respiratory: Negative.   Cardiovascular: Negative.   Gastrointestinal: Negative.   Genitourinary: Negative.   Musculoskeletal: Negative.   Skin: Negative.   Neurological: Negative.   Endo/Heme/Allergies: Negative.   Psychiatric/Behavioral: Negative.    Physical Exam   Blood pressure 111/60, pulse 89, temperature 97 F (36.1 C), resp. rate 18, last menstrual period 07/10/2011.  Physical Exam  Constitutional: She is oriented to person, place, and time. She appears well-developed and well-nourished.  HENT:  Head: Normocephalic and atraumatic.  Right Ear: External ear normal.  Left Ear: External ear normal.  Nose: Nose normal.  Eyes: Conjunctivae are normal. Pupils are equal, round, and reactive to light.  Neck: Normal range of motion. Neck supple. No thyromegaly present.  Cardiovascular: Normal rate, regular rhythm and intact distal pulses.   Respiratory: Effort normal and breath sounds normal.  GI: Soft. Bowel sounds are normal. There is no tenderness.  Genitourinary: Vagina normal and uterus normal.       Ext genitalia WNL.  Vitiligo of perianal and buttock region noted.  Large amt of dried feces noted on pt's perineum.  No redness or edema of labia.  Vagina with sm amt light pink discharge c/w menses present.  Cx without discharge or lesions.  Neg CMT.  Ut mobile, NT.  Adnexa without tenderness or masses.    Musculoskeletal: Normal range of motion.  Neurological: She is alert and oriented to person, place, and time. She has normal reflexes.  Skin: Skin is warm and dry.  Psychiatric: She has a normal mood and affect. Her behavior is normal.    MAU Course  Procedures Wet Prep-Mod  clue, few RBCs, neg yeast, neg trich GC/Chl-pending  Assessment and Plan  Perineal irritation Bacterial vaginosis  Rx Metronidazole 500mg , #14, no RF, 1 tab po bid x 7 days given. Perineal care d/w pt and pad use d/w pt.  Irritant avoidance d/w pt.  Rec use of OTC 1% hydrocortisone ointment bid x 5-7 days then discontinue.   Julia Cart O. 07/17/2011, 6:17 PM

## 2011-07-17 NOTE — Discharge Instructions (Signed)
Bacterial Vaginosis Bacterial vaginosis (BV) is a vaginal infection where the normal balance of bacteria in the vagina is disrupted. The normal balance is then replaced by an overgrowth of certain bacteria. There are several different kinds of bacteria that can cause BV. BV is the most common vaginal infection in women of childbearing age. CAUSES   The cause of BV is not fully understood. BV develops when there is an increase or imbalance of harmful bacteria.   Some activities or behaviors can upset the normal balance of bacteria in the vagina and put women at increased risk including:   Having a new sex partner or multiple sex partners.   Douching.   Using an intrauterine device (IUD) for contraception.   It is not clear what role sexual activity plays in the development of BV. However, women that have never had sexual intercourse are rarely infected with BV.  Women do not get BV from toilet seats, bedding, swimming pools or from touching objects around them.  SYMPTOMS   Grey vaginal discharge.   A fish-like odor with discharge, especially after sexual intercourse.   Itching or burning of the vagina and vulva.   Burning or pain with urination.   Some women have no signs or symptoms at all.  DIAGNOSIS  Your caregiver must examine the vagina for signs of BV. Your caregiver will perform lab tests and look at the sample of vaginal fluid through a microscope. They will look for bacteria and abnormal cells (clue cells), a pH test higher than 4.5, and a positive amine test all associated with BV.  RISKS AND COMPLICATIONS   Pelvic inflammatory disease (PID).   Infections following gynecology surgery.   Developing HIV.   Developing herpes virus.  TREATMENT  Sometimes BV will clear up without treatment. However, all women with symptoms of BV should be treated to avoid complications, especially if gynecology surgery is planned. Female partners generally do not need to be treated. However,  BV may spread between female sex partners so treatment is helpful in preventing a recurrence of BV.   BV may be treated with antibiotics. The antibiotics come in either pill or vaginal cream forms. Either can be used with nonpregnant or pregnant women, but the recommended dosages differ. These antibiotics are not harmful to the baby.   BV can recur after treatment. If this happens, a second round of antibiotics will often be prescribed.   Treatment is important for pregnant women. If not treated, BV can cause a premature delivery, especially for a pregnant woman who had a premature birth in the past. All pregnant women who have symptoms of BV should be checked and treated.   For chronic reoccurrence of BV, treatment with a type of prescribed gel vaginally twice a week is helpful.  HOME CARE INSTRUCTIONS   Finish all medication as directed by your caregiver.   Do not have sex until treatment is completed.   Tell your sexual partner that you have a vaginal infection. They should see their caregiver and be treated if they have problems, such as a mild rash or itching.   Practice safe sex. Use condoms. Only have 1 sex partner.  PREVENTION  Basic prevention steps can help reduce the risk of upsetting the natural balance of bacteria in the vagina and developing BV:  Do not have sexual intercourse (be abstinent).   Do not douche.   Use all of the medicine prescribed for treatment of BV, even if the signs and symptoms go away.     Tell your sex partner if you have BV. That way, they can be treated, if needed, to prevent reoccurrence.  SEEK MEDICAL CARE IF:   Your symptoms are not improving after 3 days of treatment.   You have increased discharge, pain, or fever.  MAKE SURE YOU:   Understand these instructions.   Will watch your condition.   Will get help right away if you are not doing well or get worse.  FOR MORE INFORMATION  Division of STD Prevention (DSTDP), Centers for Disease  Control and Prevention: www.cdc.gov/std American Social Health Association (ASHA): www.ashastd.org  Document Released: 01/18/2005 Document Revised: 01/07/2011 Document Reviewed: 07/11/2008 Exit[employer] Patient Information 2012 [employer]. 

## 2011-07-17 NOTE — MAU Note (Signed)
Pt presents to MAU with chief complaint of vaginitis that started this morning. Pt says she believes she has a yeast infection.

## 2011-07-19 LAB — GC/CHLAMYDIA PROBE AMP, GENITAL: GC Probe Amp, Genital: NEGATIVE

## 2011-07-20 ENCOUNTER — Encounter (HOSPITAL_COMMUNITY): Payer: Self-pay | Admitting: *Deleted

## 2011-07-20 ENCOUNTER — Inpatient Hospital Stay (HOSPITAL_COMMUNITY)
Admission: AD | Admit: 2011-07-20 | Discharge: 2011-07-20 | Disposition: A | Discharge status: Home / Self Care | Payer: Medicare Other | Source: Ambulatory Visit | Attending: Obstetrics and Gynecology | Admitting: Obstetrics and Gynecology

## 2011-07-20 DIAGNOSIS — L293 Anogenital pruritus, unspecified: Secondary | ICD-10-CM | POA: Diagnosis not present

## 2011-07-20 DIAGNOSIS — N898 Other specified noninflammatory disorders of vagina: Secondary | ICD-10-CM

## 2011-07-20 LAB — WET PREP, GENITAL: Yeast Wet Prep HPF POC: NONE SEEN

## 2011-07-20 MED ORDER — HYDROCORTISONE 1 % EX CREA: TOPICAL_CREAM | Freq: Two times a day (BID) | CUTANEOUS | Status: DC

## 2011-07-20 NOTE — MAU Note (Signed)
Patient states she did not take medicine that was prescribed. States she was prescribed clindamycin on 04/09/2011 (has empty bottle with her) and it helped, but did not take Flagyl because she knew it would not help. States she returned to MAU today because she did not have vaginal D/C on 6/15 and now she does.

## 2011-07-20 NOTE — MAU Provider Note (Signed)
History     CSN: [id number]  Arrival date and time: 07/20/11 7829   First Provider Initiated Contact with Patient 07/20/11 [phone]      Chief Complaint  Patient presents with  . Vaginal Discharge   HPI This is a 36 y.o. female who presents with c/o Vag itching.  Was seen here by Elsie Ra CNM 3 days ago for vaginal discharge, and found to have BV.  It is unclear as to whether she took her metronidazole or not (she told pharmacist she took all of it and told RN she took none).  Wants Rx for Clinadmycin instead because "the other one has side effects".  Does have a history of Vitiligo on her perineum and has seen a Dermatologist for this ("he gave me cream")   Denies pain or abnormal bleeding.   OB History    Grav Para Term Preterm Abortions TAB SAB Ect Mult Living   3 2 2  1  1   2       Past Medical History  Diagnosis Date  . Asthma   . Bronchitis   . Fibroid     Past Surgical History  Procedure Date  . Tubal ligation   . Dental surgery   . Bunionectomy     right foot  . Robot assisted myomectomy 03/03/2011    Procedure: ROBOTIC ASSISTED MYOMECTOMY;  Surgeon: Esmeralda Arthur, MD;  Location: [facility];  Service: Gynecology;  Laterality: N/A;    History reviewed. No pertinent family history.  History  Substance Use Topics  . Smoking status: Never Smoker   . Smokeless tobacco: Never Used  . Alcohol Use: No    Allergies: No Known Allergies  Prescriptions prior to admission  Medication Sig Dispense Refill  . albuterol (PROVENTIL HFA;VENTOLIN HFA) 108 (90 BASE) MCG/ACT inhaler Inhale 1-2 puffs into the lungs every 6 (six) hours as needed for wheezing or shortness of breath.  1 Inhaler  1  . hydrocortisone 1 % ointment Apply topically 2 (two) times daily.  30 g  0  . metroNIDAZOLE (FLAGYL) 500 MG tablet Take 1 tablet (500 mg total) by mouth 3 (three) times daily.  14 tablet  0  . predniSONE (DELTASONE) 20 MG tablet 2 tabs po daily for 5 days  10 tablet  0    ROS As  listed in HPI  Physical Exam   Blood pressure 116/71, pulse 88, temperature 97.3 F (36.3 C), temperature source Oral, resp. rate 18, height 4' 11.5" (1.511 m), weight 188 lb 6.4 oz (85.458 kg), last menstrual period 07/10/2011, SpO2 100.00%.  Physical Exam  Constitutional: She is oriented to person, place, and time. She appears well-developed and well-nourished.  HENT:  Head: Normocephalic.       Right eye with strabismus   Cardiovascular: Normal rate.   Respiratory: Effort normal.  GI: Soft. There is no tenderness.  Genitourinary: Uterus normal. Vaginal discharge (minimal thin white, no erethema, internal exam deferred) found.       Pigmentation changes on perineum and buttocks   Musculoskeletal: Normal range of motion.  Neurological: She is alert and oriented to person, place, and time.  Skin: Skin is warm and dry.  Psychiatric: She has a normal mood and affect.   Results for orders placed during the hospital encounter of 07/20/11 (from the past 24 hour(s))  WET PREP, GENITAL     Status: Abnormal   Collection Time   07/20/11  8:53 AM      Component Value Range  Yeast Wet Prep HPF POC NONE SEEN  NONE SEEN   Trich, Wet Prep NONE SEEN  NONE SEEN   Clue Cells Wet Prep HPF POC NONE SEEN  NONE SEEN   WBC, Wet Prep HPF POC FEW (*) NONE SEEN    MAU Course  Procedures  MDM Wet prep done Dr Su Hilt consulted  Assessment and Plan  A:  Vaginal pruritis      No evidence of vaginitis or BV      Known vitiligo  P:  Recommend OTC cort cream 1% or use the cream given by the Dermatologist; cautioned against prolonged use of cortisone cream which can cause thinning of skin      Followup with [facility]      Reviewed normal vaginal discharge patterns       [facility] 07/20/2011, 9:55 AM

## 2011-07-20 NOTE — MAU Note (Signed)
Patient states she started having a white vaginal discharge with burning, no pain.

## 2011-07-20 NOTE — Discharge Instructions (Signed)
Pruritus  Pruritis is an itch. There are many different problems that can cause an itch. Dry skin is one of the most common causes of itching. Most cases of itching do not require medical attention.  HOME CARE INSTRUCTIONS  Make sure your skin is moistened on a regular basis. A moisturizer that contains petroleum jelly is best for keeping moisture in your skin. If you develop a rash, you may try the following for relief:   Use corticosteroid cream.   Apply cool compresses to the affected areas.   Bathe with Epsom salts or baking soda in the bathwater.   Soak in colloidal oatmeal baths. These are available at your pharmacy.   Apply baking soda paste to the rash. Stir water into baking soda until it reaches a paste-like consistency.   Use an anti-itch lotion.   Take over-the-counter diphenhydramine medicine by mouth as the instructions direct.   Avoid scratching. Scratching may cause the rash to become infected. If itching is very bad, your caregiver may suggest prescription lotions or creams to lessen your symptoms.   Avoid hot showers, which can make itching worse. A cold shower may help with itching as long as you use a moisturizer after the shower.  SEEK MEDICAL CARE IF: The itching does not go away after several days. Document Released: 09/30/2010 Document Revised: 01/07/2011 Document Reviewed: 09/30/2010 [employer] Patient Information 2012 [employer]. 

## 2011-07-20 NOTE — MAU Note (Signed)
Patient was seen in MAU on 6-15 and given Flagyl. States she does not like the side effects and did not take it.

## 2011-07-27 DIAGNOSIS — N760 Acute vaginitis: Secondary | ICD-10-CM | POA: Diagnosis not present

## 2011-07-27 DIAGNOSIS — Z113 Encounter for screening for infections with a predominantly sexual mode of transmission: Secondary | ICD-10-CM | POA: Diagnosis not present

## 2011-10-15 ENCOUNTER — Encounter (HOSPITAL_COMMUNITY): Payer: Self-pay | Admitting: Emergency Medicine

## 2011-10-15 ENCOUNTER — Emergency Department (INDEPENDENT_AMBULATORY_CARE_PROVIDER_SITE_OTHER)
Admission: EM | Admit: 2011-10-15 | Discharge: 2011-10-15 | Disposition: A | Discharge status: Home / Self Care | Payer: Medicare Other | Source: Home / Self Care | Attending: Emergency Medicine | Admitting: Emergency Medicine

## 2011-10-15 DIAGNOSIS — J45909 Unspecified asthma, uncomplicated: Secondary | ICD-10-CM

## 2011-10-15 DIAGNOSIS — J45901 Unspecified asthma with (acute) exacerbation: Secondary | ICD-10-CM

## 2011-10-15 MED ORDER — GUAIFENESIN-CODEINE 100-10 MG/5ML PO SYRP: 10.0000 mL | ORAL_SOLUTION | Freq: Four times a day (QID) | ORAL | Status: AC | PRN

## 2011-10-15 MED ORDER — PREDNISONE 10 MG PO TABS: ORAL_TABLET | ORAL | Status: DC

## 2011-10-15 MED ORDER — METHYLPREDNISOLONE SODIUM SUCC 125 MG IJ SOLR
INTRAMUSCULAR | Status: AC
  Filled 2011-10-15: qty 2

## 2011-10-15 MED ORDER — METHYLPREDNISOLONE SODIUM SUCC 125 MG IJ SOLR
125.0000 mg | Freq: Once | INTRAMUSCULAR | Status: AC
  Administered 2011-10-15: 125 mg via INTRAMUSCULAR

## 2011-10-15 MED ORDER — ALBUTEROL SULFATE (5 MG/ML) 0.5% IN NEBU
INHALATION_SOLUTION | RESPIRATORY_TRACT | Status: AC
  Filled 2011-10-15: qty 1

## 2011-10-15 MED ORDER — BECLOMETHASONE DIPROPIONATE 80 MCG/ACT IN AERS: 2.0000 | INHALATION_SPRAY | Freq: Two times a day (BID) | RESPIRATORY_TRACT | Status: DC

## 2011-10-15 MED ORDER — ALBUTEROL SULFATE (5 MG/ML) 0.5% IN NEBU
5.0000 mg | INHALATION_SOLUTION | Freq: Once | RESPIRATORY_TRACT | Status: AC
  Administered 2011-10-15: 5 mg via RESPIRATORY_TRACT

## 2011-10-15 MED ORDER — IPRATROPIUM BROMIDE 0.02 % IN SOLN
0.5000 mg | Freq: Once | RESPIRATORY_TRACT | Status: AC
  Administered 2011-10-15: 0.5 mg via RESPIRATORY_TRACT

## 2011-10-15 MED ORDER — ALBUTEROL SULFATE HFA 108 (90 BASE) MCG/ACT IN AERS: 1.0000 | INHALATION_SPRAY | Freq: Four times a day (QID) | RESPIRATORY_TRACT | Status: DC | PRN

## 2011-10-15 NOTE — ED Notes (Addendum)
Pt c/o wheezing, coughing and SOB x1 week... Has hx of asthma... She denies: fevers, nausea, vomiting, and diarrhea.. Having some chest discomfort due to the coughing.

## 2011-10-15 NOTE — ED Provider Notes (Signed)
Chief Complaint  Patient presents with  . Cough    History of Present Illness:   Julia Shaw is a 36 year old female with a history of asthma since she was about 36 years old. She typically controls it with infrequent and when necessary use of albuterol, although she has run out of her albuterol inhaler. Over the past 2 days she's had a flareup with cough productive of white sputum wheezing, chest tightness, and pain when she coughs. She also had itchy, watery eyes. She denies fever, chills, sweats, nasal congestion, rhinorrhea, sneezing, or sore throat and she's had no GI complaints. There no specific triggers with the possible exception of pollen allergens. She has both daytime and nighttime symptoms and shortness of breath with exertion. She has never been hospitalized with asthma never been on a ventilator.  Review of Systems:  Other than noted above, the patient denies any of the following symptoms. Systemic:  No fever, chills, sweats, fatigue, myalgias, headache, weight loss or anorexia. Eye:  No redness, itching, or drainage. ENT:  No earache, ear congestion, nasal congestion, sneezing, rhinorrhea, sinus pressure, sinus pain, post nasal drip, or sore throat. Lungs:  No cough, sputum production, or shortness of breath. No chest pain. GI:  No indigestion, heartburn, abdominal pain, nausea, or vomiting. Skin:  No rash or itching.  PMFSH:  Past medical history, family history, social history, meds, and allergies were reviewed.  No history of allergic rhinitis.  No use of tobacco.  Physical Exam:   Vital signs:  BP 122/79  Pulse 90  Temp 98.6 F (37 C) (Oral)  Resp 22  SpO2 100%  LMP 09/14/2011 General:  Alert, in no distress. Eye:  No conjunctival injection or drainage. Lids were normal. ENT:  TMs and canals were normal, without erythema or inflammation.  Nasal mucosa was clear and uncongested, without drainage.  Mucous membranes were moist.  Pharynx was clear, without exudate or drainage.   There were no oral ulcerations or lesions. Neck:  Supple, no adenopathy, tenderness or mass. Lungs:  No retractions or use of accessory muscles.  No respiratory distress.  She has bilateral expiratory wheezes, no rales or rhonchi, no retractions or use of accessory muscles. Heart:  Regular rhythm, without gallops, murmers or rubs. Skin:  Clear, warm, and dry, without rash or lesions.  Course in Urgent Care Center:   Her peak expiratory flow prior to treatment was 230. She was given a DuoNeb breathing treatment and Solu-Medrol 125 mg IM. Thereafter she felt better, her lungs were clear and wheeze free, and her peak expiratory flow was 270. She was instructed in home use of the peak expiratory flow meter.  Assessment:  The encounter diagnosis was Asthma attack.  Plan:   1.  The following meds were prescribed:   New Prescriptions   ALBUTEROL (PROVENTIL HFA;VENTOLIN HFA) 108 (90 BASE) MCG/ACT INHALER    Inhale 1-2 puffs into the lungs every 6 (six) hours as needed for wheezing.   BECLOMETHASONE (QVAR) 80 MCG/ACT INHALER    Inhale 2 puffs into the lungs 2 (two) times daily.   GUAIFENESIN-CODEINE (GUIATUSS AC) 100-10 MG/5ML SYRUP    Take 10 mLs by mouth 4 (four) times daily as needed for cough.   PREDNISONE (DELTASONE) 10 MG TABLET    Take 4 tabs daily for 4 days, 3 tabs daily for 4 days, 2 tabs daily for 4 days, then 1 tab daily for 4 days.   2.  The patient was instructed in symptomatic care and handouts were  given. 3.  The patient was told to return if becoming worse in any way, if no better in 3 or 4 days, and given some red flag symptoms that would indicate earlier return.  Follow up:  The patient was told to follow up with her primary care physician in 2 weeks.     Reuben Likes, MD 10/15/11 1550

## 2012-03-08 ENCOUNTER — Emergency Department (INDEPENDENT_AMBULATORY_CARE_PROVIDER_SITE_OTHER)
Admission: EM | Admit: 2012-03-08 | Discharge: 2012-03-08 | Disposition: A | Discharge status: Home / Self Care | Payer: Medicare Other | Source: Home / Self Care | Attending: Emergency Medicine | Admitting: Emergency Medicine

## 2012-03-08 ENCOUNTER — Encounter (HOSPITAL_COMMUNITY): Payer: Self-pay | Admitting: Emergency Medicine

## 2012-03-08 DIAGNOSIS — J45909 Unspecified asthma, uncomplicated: Secondary | ICD-10-CM | POA: Diagnosis not present

## 2012-03-08 MED ORDER — ALBUTEROL SULFATE (5 MG/ML) 0.5% IN NEBU
5.0000 mg | INHALATION_SOLUTION | Freq: Once | RESPIRATORY_TRACT | Status: AC
  Administered 2012-03-08: 5 mg via RESPIRATORY_TRACT

## 2012-03-08 MED ORDER — AZITHROMYCIN 250 MG PO TABS: ORAL_TABLET | ORAL | Status: AC

## 2012-03-08 MED ORDER — IPRATROPIUM BROMIDE 0.02 % IN SOLN
0.5000 mg | Freq: Once | RESPIRATORY_TRACT | Status: AC
  Administered 2012-03-08: 0.5 mg via RESPIRATORY_TRACT

## 2012-03-08 MED ORDER — ALBUTEROL SULFATE (5 MG/ML) 0.5% IN NEBU
INHALATION_SOLUTION | RESPIRATORY_TRACT | Status: AC
  Filled 2012-03-08: qty 1

## 2012-03-08 MED ORDER — PREDNISONE 20 MG PO TABS: 40.0000 mg | ORAL_TABLET | Freq: Every day | ORAL | Status: DC

## 2012-03-08 MED ORDER — ALBUTEROL SULFATE HFA 108 (90 BASE) MCG/ACT IN AERS: 1.0000 | INHALATION_SPRAY | Freq: Four times a day (QID) | RESPIRATORY_TRACT | Status: DC | PRN

## 2012-03-08 NOTE — ED Notes (Signed)
Pt c/o cold sx x1 week Sx include: wheezing, dry cough, chest discomfort due to cough, SOB Denies: f/v/n/d, sore throat, nasal congestion, runny nose Hx of asthma Took her albuterol inhaler this am around 0930.; Also took OTC cold meds w/no relief Eating and drinking well  She is alert w/no signs of acute distress Wheezing is heard throughout.

## 2012-03-08 NOTE — ED Provider Notes (Signed)
And a will do with the in and is on the History     CSN: [id number]  Arrival date & time 03/08/12  1143   First MD Initiated Contact with Patient 03/08/12 1251      Chief Complaint  Patient presents with  . URI    (Consider location/radiation/quality/duration/timing/severity/associated sxs/prior treatment) HPI Comments: Patient presents urgent care describing for the last 7 days he been coughing congested wheezing intermittently. Have been using her albuterol at home with partial relief but symptoms and to recur. Patient denies any fevers nausea vomiting or abdominal pain. Coughing a lot with pain on both sides of her rib cage when she does that she's been coughing for many days now. He been taking some over-the-counter cold medicines. For congestion which has helped some. Patient has been eating injury fluids without any difficulties.  Patient is a 37 y.o. female presenting with URI. The history is provided by the patient.  URI The primary symptoms include swollen glands, cough and wheezing. Primary symptoms do not include fever, fatigue, abdominal pain, nausea, vomiting, myalgias, arthralgias or rash. The current episode started 6 to 7 days ago. This is a new problem. The problem has not changed since onset. The swelling is not associated with neck pain.  Symptoms associated with the illness include chills, congestion and rhinorrhea.    Past Medical History  Diagnosis Date  . Asthma   . Bronchitis   . Fibroid     Past Surgical History  Procedure Date  . Tubal ligation   . Dental surgery   . Bunionectomy     right foot  . Robot assisted myomectomy 03/03/2011    Procedure: ROBOTIC ASSISTED MYOMECTOMY;  Surgeon: Esmeralda Arthur, MD;  Location: [facility];  Service: Gynecology;  Laterality: N/A;    No family history on file.  History  Substance Use Topics  . Smoking status: Never Smoker   . Smokeless tobacco: Never Used  . Alcohol Use: No    OB History    Grav Para Term  Preterm Abortions TAB SAB Ect Mult Living   3 2 2  1  1   2       Review of Systems  Constitutional: Positive for chills. Negative for fever, activity change, appetite change and fatigue.  HENT: Positive for congestion and rhinorrhea. Negative for neck pain and neck stiffness.   Respiratory: Positive for cough and wheezing.   Cardiovascular: Negative for chest pain, palpitations and leg swelling.  Gastrointestinal: Negative for nausea, vomiting, abdominal pain and abdominal distention.  Musculoskeletal: Negative for myalgias and arthralgias.  Skin: Negative for rash.  Neurological: Negative for dizziness.    Allergies  Review of patient's allergies indicates no known allergies.  Home Medications   Current Outpatient Rx  Name  Route  Sig  Dispense  Refill  . ALBUTEROL SULFATE HFA 108 (90 BASE) MCG/ACT IN AERS   Inhalation   Inhale 1-2 puffs into the lungs every 6 (six) hours as needed for wheezing or shortness of breath.   1 Inhaler   1   . ALBUTEROL SULFATE HFA 108 (90 BASE) MCG/ACT IN AERS   Inhalation   Inhale 1-2 puffs into the lungs every 6 (six) hours as needed for wheezing.   1 Inhaler   12   . BECLOMETHASONE DIPROPIONATE 80 MCG/ACT IN AERS   Inhalation   Inhale 2 puffs into the lungs 2 (two) times daily.   1 Inhaler   0   . HYDROCORTISONE 1 % EX OINT  Topical   Apply topically 2 (two) times daily.   30 g   0     Apply twice daily to affected area for 5-7 days.   Marland Kitchen HYDROCORTISONE 1 % EX CREA   Topical   Apply topically 2 (two) times daily.   30 g   0   . PREDNISONE 10 MG PO TABS      Take 4 tabs daily for 4 days, 3 tabs daily for 4 days, 2 tabs daily for 4 days, then 1 tab daily for 4 days.   40 tablet   0     BP 101/65  Pulse 114  Temp 98.4 F (36.9 C) (Oral)  Resp 20  SpO2 99%  LMP 02/12/2012  Physical Exam  Nursing note and vitals reviewed. Constitutional: Vital signs are normal. She appears well-developed and well-nourished.   Non-toxic appearance. She does not have a sickly appearance. She does not appear ill. No distress.  HENT:  Head: Normocephalic.  Eyes: Conjunctivae normal are normal. No scleral icterus.  Neck: Neck supple. No JVD present.  Cardiovascular: Normal rate and regular rhythm.  Exam reveals no gallop and no friction rub.   No murmur heard. Pulmonary/Chest: Effort normal. No respiratory distress. She has no decreased breath sounds. She has wheezes. She has no rhonchi. She has no rales. She exhibits no tenderness.  Abdominal: Soft. Normal appearance. There is no hepatosplenomegaly, splenomegaly or hepatomegaly. There is no tenderness. There is no rigidity, no rebound, no guarding, no CVA tenderness, no tenderness at McBurney's point and negative Murphy's sign. No hernia. Hernia confirmed negative in the ventral area and confirmed negative in the left inguinal area.  Neurological: She is alert.  Skin: No rash noted. No erythema.    ED Course  Procedures (including critical care time)  Labs Reviewed - No data to display No results found.   No diagnosis found.    MDM  Patient looks comfortable in no respiratory distress- afebrile with global wheezing. Patient had a provider with a nebulizer treatment with both albuterol and Atrovent while at the clinic. With significant clinical improvement. Patient will be sent home with both a prednisone course for 5 days and a macrolide antibiotics cycle for 5 days with azithromycin. Instructed to use albuterol every 6-8 hours for the next 2 days. We have also discussed what symptoms should worsen her return for recheck in a second lung exam. Patient understands and agrees with treatment plan and followup care as necessary.        Jimmie Molly, MD 03/08/12 1346

## 2012-03-15 ENCOUNTER — Inpatient Hospital Stay (HOSPITAL_COMMUNITY)
Admission: AD | Admit: 2012-03-15 | Discharge: 2012-03-15 | Disposition: A | Discharge status: Home / Self Care | Payer: Medicare Other | Source: Ambulatory Visit | Attending: Obstetrics and Gynecology | Admitting: Obstetrics and Gynecology

## 2012-03-15 ENCOUNTER — Encounter (HOSPITAL_COMMUNITY): Payer: Self-pay

## 2012-03-15 DIAGNOSIS — B373 Candidiasis of vulva and vagina: Secondary | ICD-10-CM | POA: Insufficient documentation

## 2012-03-15 DIAGNOSIS — L293 Anogenital pruritus, unspecified: Secondary | ICD-10-CM | POA: Diagnosis not present

## 2012-03-15 DIAGNOSIS — N949 Unspecified condition associated with female genital organs and menstrual cycle: Secondary | ICD-10-CM | POA: Diagnosis not present

## 2012-03-15 DIAGNOSIS — A499 Bacterial infection, unspecified: Secondary | ICD-10-CM | POA: Diagnosis not present

## 2012-03-15 DIAGNOSIS — B3731 Acute candidiasis of vulva and vagina: Secondary | ICD-10-CM | POA: Insufficient documentation

## 2012-03-15 DIAGNOSIS — B9689 Other specified bacterial agents as the cause of diseases classified elsewhere: Secondary | ICD-10-CM | POA: Insufficient documentation

## 2012-03-15 DIAGNOSIS — N760 Acute vaginitis: Secondary | ICD-10-CM | POA: Insufficient documentation

## 2012-03-15 LAB — URINALYSIS, ROUTINE W REFLEX MICROSCOPIC
Bilirubin Urine: NEGATIVE
Hgb urine dipstick: NEGATIVE
Nitrite: NEGATIVE
Specific Gravity, Urine: 1.015 (ref 1.005–1.030)
Urobilinogen, UA: 1 mg/dL (ref 0.0–1.0)
pH: 6.5 (ref 5.0–8.0)

## 2012-03-15 LAB — WET PREP, GENITAL: Trich, Wet Prep: NONE SEEN

## 2012-03-15 LAB — POCT PREGNANCY, URINE: Preg Test, Ur: NEGATIVE

## 2012-03-15 MED ORDER — METRONIDAZOLE 0.75 % VA GEL: 1.0000 | Freq: Two times a day (BID) | VAGINAL | Status: DC

## 2012-03-15 MED ORDER — METRONIDAZOLE 500 MG PO TABS: 500.0000 mg | ORAL_TABLET | Freq: Two times a day (BID) | ORAL | Status: DC

## 2012-03-15 MED ORDER — FLUCONAZOLE 150 MG PO TABS: 150.0000 mg | ORAL_TABLET | Freq: Once | ORAL | Status: DC

## 2012-03-15 NOTE — MAU Provider Note (Signed)
History     CSN: [id number]  Arrival date and time: 03/15/12 1152   First Provider Initiated Contact with Patient 03/15/12 1258      Chief Complaint  Patient presents with  . Vaginal Discharge  . Vaginal Itching   HPI This is a 37 y.o. female who presents with c/o white vaginal discharge and itching. Denies other symptoms.  Tried calling office but did not want to wait for appointment.   OB History   Grav Para Term Preterm Abortions TAB SAB Ect Mult Living   3 2 2  1  1   2       Past Medical History  Diagnosis Date  . Asthma   . Bronchitis   . Fibroid     Past Surgical History  Procedure Laterality Date  . Tubal ligation    . Dental surgery    . Bunionectomy      right foot  . Robot assisted myomectomy  03/03/2011    Procedure: ROBOTIC ASSISTED MYOMECTOMY;  Surgeon: Esmeralda Arthur, MD;  Location: [facility];  Service: Gynecology;  Laterality: N/A;    No family history on file.  History  Substance Use Topics  . Smoking status: Never Smoker   . Smokeless tobacco: Never Used  . Alcohol Use: No    Allergies: No Known Allergies  Prescriptions prior to admission  Medication Sig Dispense Refill  . albuterol (PROVENTIL HFA;VENTOLIN HFA) 108 (90 BASE) MCG/ACT inhaler Inhale 1-2 puffs into the lungs every 6 (six) hours as needed for wheezing or shortness of breath.  1 Inhaler  1  . [DISCONTINUED] albuterol (PROVENTIL HFA;VENTOLIN HFA) 108 (90 BASE) MCG/ACT inhaler Inhale 1-2 puffs into the lungs every 6 (six) hours as needed for wheezing.  1 Inhaler  12  . [DISCONTINUED] albuterol (PROVENTIL HFA;VENTOLIN HFA) 108 (90 BASE) MCG/ACT inhaler Inhale 1-2 puffs into the lungs every 6 (six) hours as needed for wheezing.  1 Inhaler  0  . [DISCONTINUED] beclomethasone (QVAR) 80 MCG/ACT inhaler Inhale 2 puffs into the lungs 2 (two) times daily.  1 Inhaler  0  . [DISCONTINUED] hydrocortisone 1 % ointment Apply topically 2 (two) times daily.  30 g  0  . [DISCONTINUED] hydrocortisone  cream 1 % Apply topically 2 (two) times daily.  30 g  0  . [DISCONTINUED] predniSONE (DELTASONE) 10 MG tablet Take 4 tabs daily for 4 days, 3 tabs daily for 4 days, 2 tabs daily for 4 days, then 1 tab daily for 4 days.  40 tablet  0  . [DISCONTINUED] predniSONE (DELTASONE) 20 MG tablet Take 2 tablets (40 mg total) by mouth daily. 2 tablets daily for 5 days  10 tablet  0    Review of Systems  Constitutional: Negative for fever, chills and malaise/fatigue.  Gastrointestinal: Negative for nausea, vomiting, abdominal pain, diarrhea and constipation.  Genitourinary: Negative for dysuria.       Vaginal itching with discharge   Musculoskeletal: Negative for back pain.  Neurological: Negative for dizziness.   Physical Exam   Blood pressure 115/62, pulse 90, temperature 97 F (36.1 C), temperature source Oral, resp. rate 16, last menstrual period 02/25/2012.  Physical Exam  Constitutional: She is oriented to person, place, and time. She appears well-developed and well-nourished. No distress.  Cardiovascular: Normal rate.   Respiratory: Effort normal.  GI: Soft.  Genitourinary: Uterus normal. Vaginal discharge (thick white frothy discharge) found.  Musculoskeletal: Normal range of motion.  Neurological: She is alert and oriented to person, place, and time.  Skin: Skin is warm and dry.  Psychiatric: She has a normal mood and affect.    MAU Course  Procedures  MDM Cultures and wet prep done.  Results for orders placed during the hospital encounter of 03/15/12 (from the past 24 hour(s))  URINALYSIS, ROUTINE W REFLEX MICROSCOPIC     Status: None   Collection Time    03/15/12 12:00 PM      Result Value Range   Color, Urine YELLOW  YELLOW   APPearance CLEAR  CLEAR   Specific Gravity, Urine 1.015  1.005 - 1.030   pH 6.5  5.0 - 8.0   Glucose, UA NEGATIVE  NEGATIVE mg/dL   Hgb urine dipstick NEGATIVE  NEGATIVE   Bilirubin Urine NEGATIVE  NEGATIVE   Ketones, ur NEGATIVE  NEGATIVE mg/dL    Protein, ur NEGATIVE  NEGATIVE mg/dL   Urobilinogen, UA 1.0  0.0 - 1.0 mg/dL   Nitrite NEGATIVE  NEGATIVE   Leukocytes, UA NEGATIVE  NEGATIVE  POCT PREGNANCY, URINE     Status: None   Collection Time    03/15/12 12:07 PM      Result Value Range   Preg Test, Ur NEGATIVE  NEGATIVE  WET PREP, GENITAL     Status: Abnormal   Collection Time    03/15/12 12:20 PM      Result Value Range   Yeast Wet Prep HPF POC FEW (*) NONE SEEN   Trich, Wet Prep NONE SEEN  NONE SEEN   Clue Cells Wet Prep HPF POC MODERATE (*) NONE SEEN   WBC, Wet Prep HPF POC MODERATE (*) NONE SEEN     Assessment and Plan  A:  Bacterial vaginosis      Yeast vaginitis  P:  Rx metrogel (per her request)        Rx Diflucan       Follow up with office  [facility] 03/15/2012, 1:20 PM

## 2012-03-15 NOTE — MAU Note (Signed)
Onset of vaginal discharge with itching this week, denies any other complaints.

## 2012-03-17 LAB — GC/CHLAMYDIA PROBE AMP: CT Probe RNA: NEGATIVE

## 2012-05-26 ENCOUNTER — Encounter (HOSPITAL_COMMUNITY): Payer: Self-pay

## 2012-05-26 ENCOUNTER — Emergency Department (INDEPENDENT_AMBULATORY_CARE_PROVIDER_SITE_OTHER)
Admission: EM | Admit: 2012-05-26 | Discharge: 2012-05-26 | Disposition: A | Discharge status: Home / Self Care | Payer: Medicare Other | Source: Home / Self Care | Attending: Family Medicine | Admitting: Family Medicine

## 2012-05-26 DIAGNOSIS — J45901 Unspecified asthma with (acute) exacerbation: Secondary | ICD-10-CM

## 2012-05-26 MED ORDER — METHYLPREDNISOLONE ACETATE 40 MG/ML IJ SUSP: 80.0000 mg | Freq: Once | INTRAMUSCULAR | Status: DC

## 2012-05-26 MED ORDER — IPRATROPIUM BROMIDE 0.02 % IN SOLN
0.5000 mg | Freq: Once | RESPIRATORY_TRACT | Status: AC
  Administered 2012-05-26: 0.5 mg via RESPIRATORY_TRACT

## 2012-05-26 MED ORDER — ALBUTEROL SULFATE (5 MG/ML) 0.5% IN NEBU
5.0000 mg | INHALATION_SOLUTION | Freq: Once | RESPIRATORY_TRACT | Status: AC
  Administered 2012-05-26: 5 mg via RESPIRATORY_TRACT

## 2012-05-26 MED ORDER — ALBUTEROL SULFATE (5 MG/ML) 0.5% IN NEBU
INHALATION_SOLUTION | RESPIRATORY_TRACT | Status: AC
  Filled 2012-05-26: qty 1

## 2012-05-26 MED ORDER — TRIAMCINOLONE ACETONIDE 40 MG/ML IJ SUSP: 40.0000 mg | Freq: Once | INTRAMUSCULAR | Status: DC

## 2012-05-26 MED ORDER — METHYLPREDNISOLONE SODIUM SUCC 125 MG IJ SOLR
125.0000 mg | Freq: Once | INTRAMUSCULAR | Status: AC
  Administered 2012-05-26: 125 mg via INTRAMUSCULAR

## 2012-05-26 MED ORDER — METHYLPREDNISOLONE SODIUM SUCC 125 MG IJ SOLR
INTRAMUSCULAR | Status: AC
  Filled 2012-05-26: qty 2

## 2012-05-26 MED ORDER — MONTELUKAST SODIUM 10 MG PO TABS: 10.0000 mg | ORAL_TABLET | Freq: Every day | ORAL | Status: DC

## 2012-05-26 NOTE — ED Notes (Signed)
Cough, congestion, wheezing x 3 days, w asthma flare up; audible wheezing; reports clear secretions

## 2012-05-26 NOTE — ED Provider Notes (Signed)
History     CSN: [id number]  Arrival date & time 05/26/12  1146   First MD Initiated Contact with Patient 05/26/12 1150      Chief Complaint  Patient presents with  . Cough    (Consider location/radiation/quality/duration/timing/severity/associated sxs/prior treatment) Patient is a 37 y.o. female presenting with cough. The history is provided by the patient.  Cough Cough characteristics:  Productive Sputum characteristics:  Clear Severity:  Mild Onset quality:  Gradual Duration:  3 days Timing:  Constant Chronicity:  Recurrent Smoker: no   Context: exposure to allergens and weather changes   Relieved by:  Nothing Ineffective treatments:  Beta-agonist inhaler Associated symptoms: shortness of breath and wheezing   Associated symptoms: no chills and no fever     Past Medical History  Diagnosis Date  . Asthma   . Bronchitis   . Fibroid     Past Surgical History  Procedure Laterality Date  . Tubal ligation    . Dental surgery    . Bunionectomy      right foot  . Robot assisted myomectomy  03/03/2011    Procedure: ROBOTIC ASSISTED MYOMECTOMY;  Surgeon: Esmeralda Arthur, MD;  Location: [facility];  Service: Gynecology;  Laterality: N/A;    History reviewed. No pertinent family history.  History  Substance Use Topics  . Smoking status: Never Smoker   . Smokeless tobacco: Never Used  . Alcohol Use: No    OB History   Grav Para Term Preterm Abortions TAB SAB Ect Mult Living   3 2 2  1  1   2       Review of Systems  Constitutional: Negative.  Negative for fever and chills.  HENT: Negative.   Respiratory: Positive for cough, shortness of breath and wheezing.   Cardiovascular: Negative.   Gastrointestinal: Negative.     Allergies  Review of patient's allergies indicates no known allergies.  Home Medications   Current Outpatient Rx  Name  Route  Sig  Dispense  Refill  . albuterol (PROVENTIL HFA;VENTOLIN HFA) 108 (90 BASE) MCG/ACT inhaler   Inhalation  Inhale 1-2 puffs into the lungs every 6 (six) hours as needed for wheezing or shortness of breath.   1 Inhaler   1   . fluconazole (DIFLUCAN) 150 MG tablet   Oral   Take 1 tablet (150 mg total) by mouth once.   2 tablet   0     Take one tablet then in 2 days take the other tabl ...   . metroNIDAZOLE (METROGEL VAGINAL) 0.75 % vaginal gel   Vaginal   Place 1 Applicatorful vaginally 2 (two) times daily.   70 g   0   . montelukast (SINGULAIR) 10 MG tablet   Oral   Take 1 tablet (10 mg total) by mouth at bedtime.   30 tablet   1     BP 111/73  Pulse 90  Temp(Src) 98.1 F (36.7 C) (Oral)  Resp 20  SpO2 98%  LMP 05/12/2012  Physical Exam  Nursing note and vitals reviewed. Constitutional: She is oriented to person, place, and time. She appears well-developed and well-nourished.  HENT:  Head: Normocephalic.  Nose: Nose normal.  Mouth/Throat: Oropharynx is clear and moist.  Eyes: Conjunctivae are normal. Pupils are equal, round, and reactive to light.  Neck: Normal range of motion. Neck supple.  Cardiovascular: Normal rate, regular rhythm and normal heart sounds.   Pulmonary/Chest: Accessory muscle usage present. Tachypnea noted. She has decreased breath sounds. She has  wheezes. She has no rales.  Lymphadenopathy:    She has no cervical adenopathy.  Neurological: She is alert and oriented to person, place, and time.  Skin: Skin is warm and dry.    ED Course  Procedures (including critical care time)  Labs Reviewed - No data to display No results found.   1. Asthma exacerbation       MDM  Lung improved after neb , sx improved., clear to ausc, peak flow at d/c 310.        Linna Hoff, MD 05/26/12 1322

## 2012-06-30 ENCOUNTER — Emergency Department (INDEPENDENT_AMBULATORY_CARE_PROVIDER_SITE_OTHER)
Admission: EM | Admit: 2012-06-30 | Discharge: 2012-06-30 | Disposition: A | Discharge status: Home / Self Care | Payer: Medicare Other | Source: Home / Self Care | Attending: Emergency Medicine | Admitting: Emergency Medicine

## 2012-06-30 ENCOUNTER — Encounter (HOSPITAL_COMMUNITY): Payer: Self-pay | Admitting: *Deleted

## 2012-06-30 DIAGNOSIS — J45901 Unspecified asthma with (acute) exacerbation: Secondary | ICD-10-CM

## 2012-06-30 MED ORDER — ALBUTEROL SULFATE (5 MG/ML) 0.5% IN NEBU
5.0000 mg | INHALATION_SOLUTION | Freq: Once | RESPIRATORY_TRACT | Status: AC
  Administered 2012-06-30: 5 mg via RESPIRATORY_TRACT

## 2012-06-30 MED ORDER — ALBUTEROL SULFATE (5 MG/ML) 0.5% IN NEBU
INHALATION_SOLUTION | RESPIRATORY_TRACT | Status: AC
  Filled 2012-06-30: qty 1

## 2012-06-30 MED ORDER — IPRATROPIUM BROMIDE 0.02 % IN SOLN
0.5000 mg | Freq: Once | RESPIRATORY_TRACT | Status: AC
  Administered 2012-06-30: 0.5 mg via RESPIRATORY_TRACT

## 2012-06-30 MED ORDER — PREDNISONE 10 MG PO TABS: 50.0000 mg | ORAL_TABLET | Freq: Every day | ORAL | Status: DC

## 2012-06-30 NOTE — ED Provider Notes (Signed)
Medical screening examination/treatment/procedure(s) were performed by non-physician practitioner and as supervising physician I was immediately available for consultation/collaboration.  Leslee Home, M.D.  Reuben Likes, MD 06/30/12 2046

## 2012-06-30 NOTE — ED Notes (Addendum)
C/o wheezing and coughing onset yesterday.  No fever.  No runny nose, sore throat or earache.  Cough prod. White sputum.

## 2012-06-30 NOTE — ED Provider Notes (Signed)
History     CSN: [id number]  Arrival date & time 06/30/12  1816   First MD Initiated Contact with Patient 06/30/12 1959      Chief Complaint  Patient presents with  . Wheezing  . Cough    (Consider location/radiation/quality/duration/timing/severity/associated sxs/prior treatment) Patient is a 37 y.o. female presenting with wheezing. The history is provided by the patient.  Wheezing Severity:  Severe Severity compared to prior episodes:  Similar Onset quality:  Gradual Duration: pt is unable to specify. Timing:  Constant Progression:  Worsening Chronicity:  Recurrent Context comment:  Pt reports sx are worse at night but otherwise can't specify context Relieved by:  Nothing Exacerbated by: night time. Ineffective treatments:  Beta-agonist inhaler (pt reports she is using albuterol 2-3 times per day) Associated symptoms: cough   Associated symptoms: no chest pain, no ear pain, no fever, no PND, no rhinorrhea, no sore throat and no sputum production     Past Medical History  Diagnosis Date  . Asthma   . Bronchitis   . Fibroid     Past Surgical History  Procedure Laterality Date  . Tubal ligation    . Dental surgery    . Bunionectomy      right foot  . Robot assisted myomectomy  03/03/2011    Procedure: ROBOTIC ASSISTED MYOMECTOMY;  Surgeon: Esmeralda Arthur, MD;  Location: [facility];  Service: Gynecology;  Laterality: N/A;    Family History  Problem Relation Age of Onset  . Diabetes Mother     History  Substance Use Topics  . Smoking status: Never Smoker   . Smokeless tobacco: Never Used  . Alcohol Use: No    OB History   Grav Para Term Preterm Abortions TAB SAB Ect Mult Living   3 2 2  1  1   2       Review of Systems  Constitutional: Negative for fever and chills.  HENT: Negative for ear pain, congestion, sore throat, rhinorrhea, postnasal drip and sinus pressure.   Respiratory: Positive for cough and wheezing. Negative for sputum production.    Cardiovascular: Negative for chest pain and PND.    Allergies  Review of patient's allergies indicates no known allergies.  Home Medications   Current Outpatient Rx  Name  Route  Sig  Dispense  Refill  . albuterol (PROVENTIL HFA;VENTOLIN HFA) 108 (90 BASE) MCG/ACT inhaler   Inhalation   Inhale 1-2 puffs into the lungs every 6 (six) hours as needed for wheezing or shortness of breath.   1 Inhaler   1   . montelukast (SINGULAIR) 10 MG tablet   Oral   Take 1 tablet (10 mg total) by mouth at bedtime.   30 tablet   1   . fluconazole (DIFLUCAN) 150 MG tablet   Oral   Take 1 tablet (150 mg total) by mouth once.   2 tablet   0     Take one tablet then in 2 days take the other tabl ...   . metroNIDAZOLE (METROGEL VAGINAL) 0.75 % vaginal gel   Vaginal   Place 1 Applicatorful vaginally 2 (two) times daily.   70 g   0   . predniSONE (DELTASONE) 10 MG tablet   Oral   Take 5 tablets (50 mg total) by mouth daily.   25 tablet   0     BP 125/94  Pulse 113  Temp(Src) 98.5 F (36.9 C) (Oral)  LMP 06/27/2012  Physical Exam  Constitutional: She appears well-developed  and well-nourished. No distress.  HENT:  Right Ear: Tympanic membrane, external ear and ear canal normal.  Left Ear: Tympanic membrane, external ear and ear canal normal.  Nose: No mucosal edema or rhinorrhea.  Mouth/Throat: Oropharynx is clear and moist and mucous membranes are normal.  Cardiovascular: Regular rhythm and normal heart sounds.  Tachycardia present.   Pulmonary/Chest: Effort normal. No respiratory distress. She has wheezes.  B diffuse wheezing    ED Course  Procedures (including critical care time)  Labs Reviewed - No data to display No results found.   1. Asthma exacerbation attacks, unspecified asthma severity       MDM  Pt received albuterol 5mg  and atrovent 0.5mg  nebs; these completely resolved wheezing.  Rx prednisone 50mg  daily for 5 days.  Pt to f/u with pcp for help better  managing frequent exacerbations.         Cathlyn Parsons, NP 06/30/12 2004

## 2012-07-10 ENCOUNTER — Encounter (HOSPITAL_COMMUNITY): Payer: Self-pay | Admitting: *Deleted

## 2012-07-10 ENCOUNTER — Inpatient Hospital Stay (HOSPITAL_COMMUNITY)
Admission: AD | Admit: 2012-07-10 | Discharge: 2012-07-10 | Disposition: A | Discharge status: Home / Self Care | Payer: Medicare Other | Source: Ambulatory Visit | Attending: Obstetrics and Gynecology | Admitting: Obstetrics and Gynecology

## 2012-07-10 DIAGNOSIS — R300 Dysuria: Secondary | ICD-10-CM | POA: Diagnosis not present

## 2012-07-10 DIAGNOSIS — N760 Acute vaginitis: Secondary | ICD-10-CM | POA: Diagnosis not present

## 2012-07-10 DIAGNOSIS — L293 Anogenital pruritus, unspecified: Secondary | ICD-10-CM | POA: Diagnosis not present

## 2012-07-10 DIAGNOSIS — A499 Bacterial infection, unspecified: Secondary | ICD-10-CM | POA: Diagnosis not present

## 2012-07-10 DIAGNOSIS — B9689 Other specified bacterial agents as the cause of diseases classified elsewhere: Secondary | ICD-10-CM | POA: Insufficient documentation

## 2012-07-10 DIAGNOSIS — B373 Candidiasis of vulva and vagina: Secondary | ICD-10-CM

## 2012-07-10 LAB — URINALYSIS, ROUTINE W REFLEX MICROSCOPIC
Bilirubin Urine: NEGATIVE
Glucose, UA: NEGATIVE mg/dL
Hgb urine dipstick: NEGATIVE
Specific Gravity, Urine: 1.01 (ref 1.005–1.030)
Urobilinogen, UA: 1 mg/dL (ref 0.0–1.0)

## 2012-07-10 LAB — WET PREP, GENITAL
Trich, Wet Prep: NONE SEEN
Yeast Wet Prep HPF POC: NONE SEEN

## 2012-07-10 LAB — URINE MICROSCOPIC-ADD ON

## 2012-07-10 MED ORDER — FLUCONAZOLE 150 MG PO TABS: 150.0000 mg | ORAL_TABLET | Freq: Once | ORAL | Status: DC

## 2012-07-10 MED ORDER — METRONIDAZOLE 0.75 % VA GEL: 1.0000 | Freq: Every day | VAGINAL | Status: DC

## 2012-07-10 NOTE — MAU Note (Signed)
PT SAYS  SHE HAS VAG D/C- STARTED ON Sunday- ITCHES.   HAS NOT USED ANY OTC CREAMS. [facility].    HAD BTL -  1999. LAST SEX- LAST WEEK.

## 2012-07-10 NOTE — MAU Provider Note (Signed)
History     CSN: [id number]  Arrival date and time: 07/10/12 4098   First Provider Initiated Contact with Patient 07/10/12 2122      Chief Complaint  Patient presents with  . Vaginitis   HPI 37 y.o. [id number] non-pregnant female with yeast infection. Pt c/o vaginal itching and white discharge for 2 days, burning with urination. Similar to past yeast infections. Would like to be checked for gonorrhea/chlamydia as well.  Sexually active with one long-term partner. Uses condoms occasionally. S/p BTL.  LMP 5/27.  Does douche occasionally.   OB History   Grav Para Term Preterm Abortions TAB SAB Ect Mult Living   3 2 2  1  1   2       Past Medical History  Diagnosis Date  . Asthma   . Bronchitis   . Fibroid     Past Surgical History  Procedure Laterality Date  . Tubal ligation    . Dental surgery    . Bunionectomy      right foot  . Robot assisted myomectomy  03/03/2011    Procedure: ROBOTIC ASSISTED MYOMECTOMY;  Surgeon: Esmeralda Arthur, MD;  Location: [facility];  Service: Gynecology;  Laterality: N/A;    Family History  Problem Relation Age of Onset  . Diabetes Mother     History  Substance Use Topics  . Smoking status: Never Smoker   . Smokeless tobacco: Never Used  . Alcohol Use: No    Allergies: No Known Allergies  Prescriptions prior to admission  Medication Sig Dispense Refill  . albuterol (PROVENTIL HFA;VENTOLIN HFA) 108 (90 BASE) MCG/ACT inhaler Inhale 1-2 puffs into the lungs every 6 (six) hours as needed for wheezing or shortness of breath.  1 Inhaler  1  . montelukast (SINGULAIR) 10 MG tablet Take 1 tablet (10 mg total) by mouth at bedtime.  30 tablet  1    ROS See HPI  Physical Exam   Blood pressure 102/58, pulse 82, temperature 98.9 F (37.2 C), temperature source Oral, resp. rate 16, height 5' (1.524 m), weight 91.797 kg (202 lb 6 oz), last menstrual period 06/27/2012.  Physical Exam  Constitutional: She appears well-developed and well-nourished.   HENT:  Head: Normocephalic and atraumatic.  Eyes: Conjunctivae are normal.  R eye deviated laterally.  Neck: Normal range of motion. Neck supple.  Cardiovascular: Normal rate, regular rhythm and normal heart sounds.   Respiratory: Effort normal and breath sounds normal. No respiratory distress.  GI: Soft. Bowel sounds are normal. There is no tenderness. There is no rebound and no guarding.  Genitourinary:  Normal external genitalia with milky white discharge at introitus. Strong fishy odor. Normal cervix, no CMT, no adnexal tenderness.  Musculoskeletal: She exhibits no edema and no tenderness.   Results for orders placed during the hospital encounter of 07/10/12 (from the past 24 hour(s))  URINALYSIS, ROUTINE W REFLEX MICROSCOPIC     Status: Abnormal   Collection Time    07/10/12  7:38 PM      Result Value Range   Color, Urine YELLOW  YELLOW   APPearance CLEAR  CLEAR   Specific Gravity, Urine 1.010  1.005 - 1.030   pH 6.5  5.0 - 8.0   Glucose, UA NEGATIVE  NEGATIVE mg/dL   Hgb urine dipstick NEGATIVE  NEGATIVE   Bilirubin Urine NEGATIVE  NEGATIVE   Ketones, ur NEGATIVE  NEGATIVE mg/dL   Protein, ur NEGATIVE  NEGATIVE mg/dL   Urobilinogen, UA 1.0  0.0 - 1.0 mg/dL  Nitrite NEGATIVE  NEGATIVE   Leukocytes, UA TRACE (*) NEGATIVE  URINE MICROSCOPIC-ADD ON     Status: Abnormal   Collection Time    07/10/12  7:38 PM      Result Value Range   Squamous Epithelial / LPF FEW (*) RARE   WBC, UA 3-6  <3 WBC/hpf   RBC / HPF 0-2  <3 RBC/hpf   Bacteria, UA RARE  RARE  WET PREP, GENITAL     Status: Abnormal   Collection Time    07/10/12  9:30 PM      Result Value Range   Yeast Wet Prep HPF POC NONE SEEN  NONE SEEN   Trich, Wet Prep NONE SEEN  NONE SEEN   Clue Cells Wet Prep HPF POC FEW (*) NONE SEEN   WBC, Wet Prep HPF POC FEW (*) NONE SEEN    MAU Course  Procedures   Assessment and Plan  37 y.o. [id number] non-pregnant female with vaginal discharge - BV:  metrogel x 5 days - F/U  with Dr. Marcelle Overlie as needed.   Napoleon Form 07/10/2012, 9:24 PM

## 2012-07-10 NOTE — MAU Note (Signed)
C/o ? yeast infection for a couple of days;

## 2012-10-09 ENCOUNTER — Emergency Department (HOSPITAL_COMMUNITY): Payer: No Typology Code available for payment source

## 2012-10-09 ENCOUNTER — Emergency Department (HOSPITAL_COMMUNITY)
Admission: EM | Admit: 2012-10-09 | Discharge: 2012-10-09 | Disposition: A | Discharge status: Home / Self Care | Payer: No Typology Code available for payment source | Attending: Emergency Medicine | Admitting: Emergency Medicine

## 2012-10-09 ENCOUNTER — Encounter (HOSPITAL_COMMUNITY): Payer: Self-pay | Admitting: Emergency Medicine

## 2012-10-09 DIAGNOSIS — S46909A Unspecified injury of unspecified muscle, fascia and tendon at shoulder and upper arm level, unspecified arm, initial encounter: Secondary | ICD-10-CM | POA: Diagnosis not present

## 2012-10-09 DIAGNOSIS — Y9301 Activity, walking, marching and hiking: Secondary | ICD-10-CM | POA: Insufficient documentation

## 2012-10-09 DIAGNOSIS — M79609 Pain in unspecified limb: Secondary | ICD-10-CM | POA: Diagnosis not present

## 2012-10-09 DIAGNOSIS — IMO0002 Reserved for concepts with insufficient information to code with codable children: Secondary | ICD-10-CM | POA: Diagnosis not present

## 2012-10-09 DIAGNOSIS — S4980XA Other specified injuries of shoulder and upper arm, unspecified arm, initial encounter: Secondary | ICD-10-CM | POA: Insufficient documentation

## 2012-10-09 DIAGNOSIS — J45909 Unspecified asthma, uncomplicated: Secondary | ICD-10-CM | POA: Diagnosis not present

## 2012-10-09 DIAGNOSIS — Y92410 Unspecified street and highway as the place of occurrence of the external cause: Secondary | ICD-10-CM | POA: Insufficient documentation

## 2012-10-09 DIAGNOSIS — Z79899 Other long term (current) drug therapy: Secondary | ICD-10-CM | POA: Diagnosis not present

## 2012-10-09 DIAGNOSIS — Z8742 Personal history of other diseases of the female genital tract: Secondary | ICD-10-CM | POA: Insufficient documentation

## 2012-10-09 DIAGNOSIS — S8990XA Unspecified injury of unspecified lower leg, initial encounter: Secondary | ICD-10-CM | POA: Insufficient documentation

## 2012-10-09 DIAGNOSIS — M1710 Unilateral primary osteoarthritis, unspecified knee: Secondary | ICD-10-CM | POA: Diagnosis not present

## 2012-10-09 MED ORDER — NAPROXEN 500 MG PO TABS: 500.0000 mg | ORAL_TABLET | Freq: Two times a day (BID) | ORAL | Status: DC

## 2012-10-09 MED ORDER — TRAMADOL HCL 50 MG PO TABS: 50.0000 mg | ORAL_TABLET | Freq: Four times a day (QID) | ORAL | Status: DC | PRN
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
## 2012-10-10 NOTE — ED Provider Notes (Signed)
Medical screening examination/treatment/procedure(s) were performed by non-physician practitioner and as supervising physician I was immediately available for consultation/collaboration.    Margrit Minner L Steffon Gladu, MD 10/10/12 1509 

## 2012-11-19 DIAGNOSIS — Z23 Encounter for immunization: Secondary | ICD-10-CM | POA: Diagnosis not present

## 2012-12-12 ENCOUNTER — Emergency Department (INDEPENDENT_AMBULATORY_CARE_PROVIDER_SITE_OTHER)
Admission: EM | Admit: 2012-12-12 | Discharge: 2012-12-12 | Disposition: A | Discharge status: Home / Self Care | Payer: Medicare Other | Source: Home / Self Care | Attending: Family Medicine | Admitting: Family Medicine

## 2012-12-12 ENCOUNTER — Encounter (HOSPITAL_COMMUNITY): Payer: Self-pay | Admitting: Emergency Medicine

## 2012-12-12 DIAGNOSIS — R5382 Chronic fatigue, unspecified: Secondary | ICD-10-CM | POA: Diagnosis not present

## 2012-12-12 DIAGNOSIS — B349 Viral infection, unspecified: Secondary | ICD-10-CM

## 2012-12-12 DIAGNOSIS — B9789 Other viral agents as the cause of diseases classified elsewhere: Secondary | ICD-10-CM | POA: Diagnosis not present

## 2012-12-12 NOTE — ED Notes (Signed)
C/o headache and feeling weak, just feel "sick".  No nausea, no vomiting, no diarrhea, last bm was yesterday.  Symptoms started today

## 2012-12-12 NOTE — ED Provider Notes (Signed)
CSN: [id number]     Arrival date & time 12/12/12  1739 History   First MD Initiated Contact with Patient 12/12/12 1912     Chief Complaint  Patient presents with  . Weakness  . Headache   (Consider location/radiation/quality/duration/timing/severity/associated sxs/prior Treatment) HPI Comments: 37 year old obese female complaining of feeling weak today and feeling like she is going to pass out. She also has a mild headache and feels a little nervous.   Past Medical History  Diagnosis Date  . Asthma   . Bronchitis   . Fibroid    Past Surgical History  Procedure Laterality Date  . Tubal ligation    . Dental surgery    . Bunionectomy      right foot  . Robot assisted myomectomy  03/03/2011    Procedure: ROBOTIC ASSISTED MYOMECTOMY;  Surgeon: Esmeralda Arthur, MD;  Location: [facility];  Service: Gynecology;  Laterality: N/A;   Family History  Problem Relation Age of Onset  . Diabetes Mother    History  Substance Use Topics  . Smoking status: Never Smoker   . Smokeless tobacco: Never Used  . Alcohol Use: No   OB History   Grav Para Term Preterm Abortions TAB SAB Ect Mult Living   3 2 2  1  1   2      Review of Systems  Constitutional: Positive for activity change. Negative for fever, chills, diaphoresis and fatigue.  HENT: Positive for postnasal drip. Negative for congestion, sinus pressure and sore throat.   Respiratory: Negative.  Negative for cough, chest tightness and shortness of breath.   Cardiovascular: Negative.   Gastrointestinal: Negative.   Genitourinary: Negative.   Musculoskeletal: Negative.   Skin: Negative.   Neurological: Negative.     Allergies  Review of patient's allergies indicates no known allergies.  Home Medications   Current Outpatient Rx  Name  Route  Sig  Dispense  Refill  . Naproxen Sodium (ALEVE PO)   Oral   Take by mouth.         . naproxen (NAPROSYN) 500 MG tablet   Oral   Take 1 tablet (500 mg total) by mouth 2 (two) times  daily.   30 tablet   0   . traMADol (ULTRAM) 50 MG tablet   Oral   Take 1 tablet (50 mg total) by mouth every 6 (six) hours as needed for pain.   15 tablet   0    BP 112/73  Pulse 100  Temp(Src) 97.8 F (36.6 C) (Oral)  Resp 16  SpO2 100%  LMP 11/23/2012 Physical Exam  Nursing note and vitals reviewed. Constitutional: She is oriented to person, place, and time. She appears well-developed and well-nourished. No distress.  HENT:  Head: Normocephalic and atraumatic.  Mouth/Throat: Oropharynx is clear and moist. No oropharyngeal exudate.  Eyes: EOM are normal.  Neck: Normal range of motion. Neck supple.  Cardiovascular: Normal rate, normal heart sounds and intact distal pulses.   No murmur heard. Pulmonary/Chest: Effort normal and breath sounds normal. No respiratory distress. She has no wheezes. She has no rales.  Abdominal: Soft. There is no tenderness.  Musculoskeletal: Normal range of motion.  Lymphadenopathy:    She has no cervical adenopathy.  Neurological: She is alert and oriented to person, place, and time. No cranial nerve deficit. She exhibits normal muscle tone.  Skin: Skin is warm and dry.  Psychiatric: She has a normal mood and affect.    ED Course  Procedures (including critical care  time) Labs Review Labs Reviewed  GLUCOSE, CAPILLARY - Abnormal; Notable for the following:    Glucose-Capillary 104 (*)    All other components within normal limits   Imaging Review No results found.    MDM   1. Viral syndrome      Rest, plenty of fluids. Tylenol as needed. Let it run its course. If worse or new symptoms or problems see yur doctor or return  Hayden Rasmussen, NP 12/12/12 [phone]

## 2012-12-13 NOTE — ED Provider Notes (Signed)
Medical screening examination/treatment/procedure(s) were performed by a resident physician or non-physician practitioner and as the supervising physician I was immediately available for consultation/collaboration.  Clementeen Graham, MD      Rodolph Bong, MD 12/13/12 [phone]

## 2013-04-30 ENCOUNTER — Encounter (HOSPITAL_COMMUNITY): Payer: Self-pay | Admitting: *Deleted

## 2013-04-30 ENCOUNTER — Inpatient Hospital Stay (HOSPITAL_COMMUNITY)
Admission: AD | Admit: 2013-04-30 | Discharge: 2013-04-30 | Disposition: A | Discharge status: Home / Self Care | Payer: Medicare Other | Source: Ambulatory Visit | Attending: Obstetrics & Gynecology | Admitting: Obstetrics & Gynecology

## 2013-04-30 DIAGNOSIS — N760 Acute vaginitis: Secondary | ICD-10-CM | POA: Diagnosis not present

## 2013-04-30 DIAGNOSIS — J45909 Unspecified asthma, uncomplicated: Secondary | ICD-10-CM | POA: Insufficient documentation

## 2013-04-30 DIAGNOSIS — Z833 Family history of diabetes mellitus: Secondary | ICD-10-CM | POA: Diagnosis not present

## 2013-04-30 DIAGNOSIS — L80 Vitiligo: Secondary | ICD-10-CM | POA: Diagnosis not present

## 2013-04-30 LAB — WET PREP, GENITAL
TRICH WET PREP: NONE SEEN
Yeast Wet Prep HPF POC: NONE SEEN

## 2013-04-30 LAB — POCT PREGNANCY, URINE: PREG TEST UR: NEGATIVE

## 2013-04-30 MED ORDER — TERCONAZOLE 0.4 % VA CREA: 1.0000 | TOPICAL_CREAM | Freq: Every day | VAGINAL | Status: DC

## 2013-04-30 NOTE — MAU Provider Note (Signed)
History     CSN: [id number]  Arrival date and time: 04/30/13 1448   None     Chief Complaint  Patient presents with  . Vaginal Discharge  . vaginal irritation    HPI This is a 38 y.o. female who presents with c/o vaginal discharge, irritation and itching. States this has been occuring for about a week.   RN Note:  Pt thinks she may have a yeast infection, has discharge, irritation & itching.        OB History   Grav Para Term Preterm Abortions TAB SAB Ect Mult Living   3 2 2  1  1   2       Past Medical History  Diagnosis Date  . Asthma   . Bronchitis   . Fibroid     Past Surgical History  Procedure Laterality Date  . Tubal ligation    . Dental surgery    . Bunionectomy      right foot  . Robot assisted myomectomy  03/03/2011    Procedure: ROBOTIC ASSISTED MYOMECTOMY;  Surgeon: Alwyn Pea, MD;  Location: [facility];  Service: Gynecology;  Laterality: N/A;    Family History  Problem Relation Age of Onset  . Diabetes Mother     History  Substance Use Topics  . Smoking status: Never Smoker   . Smokeless tobacco: Never Used  . Alcohol Use: No    Allergies: No Known Allergies  No prescriptions prior to admission    Review of Systems  Constitutional: Negative for fever, chills and malaise/fatigue.  Gastrointestinal: Negative for nausea, vomiting and abdominal pain.  Genitourinary:       Vaginal itching and irritation  Neurological: Negative for dizziness.   Physical Exam   Blood pressure 109/65, pulse 86, temperature 98.3 F (36.8 C), temperature source Oral, resp. rate 18, height 5\' 1"  (1.549 m), weight 85.911 kg (189 lb 6.4 oz), last menstrual period 04/10/2013, SpO2 100.00%.  Physical Exam  Constitutional: She is oriented to person, place, and time. She appears well-developed and well-nourished. No distress.  HENT:  Head: Normocephalic.  Cardiovascular: Normal rate.   Respiratory: Effort normal.  GI: Soft. She exhibits no distension.  There is no tenderness. There is no rebound and no guarding.  Genitourinary: Uterus normal. Vaginal discharge (thin white) found.  Minimal erethema No lesions  Musculoskeletal: Normal range of motion.  Neurological: She is alert and oriented to person, place, and time.  Skin: Skin is warm and dry.  Psychiatric: She has a normal mood and affect.    MAU Course  Procedures  MDM Results for orders placed during the hospital encounter of 04/30/13 (from the past 72 hour(s))  POCT PREGNANCY, URINE     Status: None   Collection Time    04/30/13  8:17 AM      Result Value Ref Range   Preg Test, Ur NEGATIVE  NEGATIVE   Comment:            THE SENSITIVITY OF THIS     METHODOLOGY IS >24 mIU/mL  WET PREP, GENITAL     Status: Abnormal   Collection Time    04/30/13  9:04 AM      Result Value Ref Range   Yeast Wet Prep HPF POC NONE SEEN  NONE SEEN   Trich, Wet Prep NONE SEEN  NONE SEEN   Clue Cells Wet Prep HPF POC FEW (*) NONE SEEN   WBC, Wet Prep HPF POC MODERATE (*) NONE SEEN  Comment: MODERATE BACTERIA SEEN  GC/CHLAMYDIA PROBE AMP     Status: None   Collection Time    04/30/13  9:04 AM      Result Value Ref Range   CT Probe RNA NEGATIVE  NEGATIVE   GC Probe RNA NEGATIVE  NEGATIVE   Comment: (NOTE)                                                                                               **Normal Reference Range: Negative**          Assay performed using the Gen-Probe APTIMA COMBO2 (R) Assay.     Acceptable specimen types for this assay include APTIMA Swabs (Unisex,     endocervical, urethral, or vaginal), first void urine, and ThinPrep     liquid based cytology samples.     Performed at [employer] and Plan  A: Vaginitis      Nonsegmental vitiligo  P: Discussed findings      Will tx for yeast even though not seen on wet prep       Follow up as needed  [facility] 04/30/2013, 8:48 AM

## 2013-04-30 NOTE — Discharge Instructions (Signed)
Vaginitis Vaginitis is an inflammation of the vagina. It is most often caused by a change in the normal balance of the bacteria and yeast that live in the vagina. This change in balance causes an overgrowth of certain bacteria or yeast, which causes the inflammation. There are different types of vaginitis, but the most common types are:  Bacterial vaginosis.  Yeast infection (candidiasis).  Trichomoniasis vaginitis. This is a sexually transmitted infection (STI).  Viral vaginitis.  Atropic vaginitis.  Allergic vaginitis. CAUSES  The cause depends on the type of vaginitis. Vaginitis can be caused by:  Bacteria (bacterial vaginosis).  Yeast (yeast infection).  A parasite (trichomoniasis vaginitis)  A virus (viral vaginitis).  Low hormone levels (atrophic vaginitis). Low hormone levels can occur during pregnancy, breastfeeding, or after menopause.  Irritants, such as bubble baths, scented tampons, and feminine sprays (allergic vaginitis). Other factors can change the normal balance of the yeast and bacteria that live in the vagina. These include:  Antibiotic medicines.  Poor hygiene.  Diaphragms, vaginal sponges, spermicides, birth control pills, and intrauterine devices (IUD).  Sexual intercourse.  Infection.  Uncontrolled diabetes.  A weakened immune system. SYMPTOMS  Symptoms can vary depending on the cause of the vaginitis. Common symptoms include:  Abnormal vaginal discharge.  The discharge is white, gray, or yellow with bacterial vaginosis.  The discharge is thick, white, and cheesy with a yeast infection.  The discharge is frothy and yellow or greenish with trichomoniasis.  A bad vaginal odor.  The odor is fishy with bacterial vaginosis.  Vaginal itching, pain, or swelling.  Painful intercourse.  Pain or burning when urinating. Sometimes, there are no symptoms. TREATMENT  Treatment will vary depending on the type of infection.   Bacterial  vaginosis and trichomoniasis are often treated with antibiotic creams or pills.  Yeast infections are often treated with antifungal medicines, such as vaginal creams or suppositories.  Viral vaginitis has no cure, but symptoms can be treated with medicines that relieve discomfort. Your sexual partner should be treated as well.  Atrophic vaginitis may be treated with an estrogen cream, pill, suppository, or vaginal ring. If vaginal dryness occurs, lubricants and moisturizing creams may help. You may be told to avoid scented soaps, sprays, or douches.  Allergic vaginitis treatment involves quitting the use of the product that is causing the problem. Vaginal creams can be used to treat the symptoms. HOME CARE INSTRUCTIONS   Take all medicines as directed by your caregiver.  Keep your genital area clean and dry. Avoid soap and only rinse the area with water.  Avoid douching. It can remove the healthy bacteria in the vagina.  Do not use tampons or have sexual intercourse until your vaginitis has been treated. Use sanitary pads while you have vaginitis.  Wipe from front to back. This avoids the spread of bacteria from the rectum to the vagina.  Let air reach your genital area.  Wear cotton underwear to decrease moisture buildup.  Avoid wearing underwear while you sleep until your vaginitis is gone.  Avoid tight pants and underwear or nylons without a cotton panel.  Take off wet clothing (especially bathing suits) as soon as possible.  Use mild, non-scented products. Avoid using irritants, such as:  Scented feminine sprays.  Fabric softeners.  Scented detergents.  Scented tampons.  Scented soaps or bubble baths.  Practice safe sex and use condoms. Condoms may prevent the spread of trichomoniasis and viral vaginitis. SEEK MEDICAL CARE IF:   You have abdominal pain.  You   have a fever or persistent symptoms for more than 2 3 days.  You have a fever and your symptoms suddenly  get worse. Document Released: 11/15/2006 Document Revised: 10/13/2011 Document Reviewed: 07/01/2011 Exit[employer] Patient Information 2014 [employer].  

## 2013-04-30 NOTE — MAU Note (Signed)
Pt thinks she may have a yeast infection, has discharge, irritation & itching.

## 2013-05-01 LAB — GC/CHLAMYDIA PROBE AMP
CT Probe RNA: NEGATIVE
GC Probe RNA: NEGATIVE

## 2013-05-21 ENCOUNTER — Encounter (HOSPITAL_COMMUNITY): Payer: Self-pay | Admitting: *Deleted

## 2013-05-21 ENCOUNTER — Inpatient Hospital Stay (HOSPITAL_COMMUNITY)
Admission: AD | Admit: 2013-05-21 | Discharge: 2013-05-21 | Disposition: A | Discharge status: Home / Self Care | Payer: Medicare Other | Source: Ambulatory Visit | Attending: Obstetrics & Gynecology | Admitting: Obstetrics & Gynecology

## 2013-05-21 DIAGNOSIS — B9689 Other specified bacterial agents as the cause of diseases classified elsewhere: Secondary | ICD-10-CM | POA: Diagnosis not present

## 2013-05-21 DIAGNOSIS — N898 Other specified noninflammatory disorders of vagina: Secondary | ICD-10-CM

## 2013-05-21 DIAGNOSIS — A499 Bacterial infection, unspecified: Secondary | ICD-10-CM | POA: Diagnosis not present

## 2013-05-21 DIAGNOSIS — N760 Acute vaginitis: Secondary | ICD-10-CM | POA: Diagnosis not present

## 2013-05-21 DIAGNOSIS — L293 Anogenital pruritus, unspecified: Secondary | ICD-10-CM | POA: Diagnosis not present

## 2013-05-21 DIAGNOSIS — N949 Unspecified condition associated with female genital organs and menstrual cycle: Secondary | ICD-10-CM | POA: Insufficient documentation

## 2013-05-21 LAB — GC/CHLAMYDIA PROBE AMP
CT PROBE, AMP APTIMA: NEGATIVE
GC PROBE AMP APTIMA: NEGATIVE

## 2013-05-21 LAB — URINALYSIS, ROUTINE W REFLEX MICROSCOPIC
BILIRUBIN URINE: NEGATIVE
GLUCOSE, UA: NEGATIVE mg/dL
Hgb urine dipstick: NEGATIVE
KETONES UR: NEGATIVE mg/dL
Leukocytes, UA: NEGATIVE
Nitrite: NEGATIVE
PH: 6 (ref 5.0–8.0)
PROTEIN: NEGATIVE mg/dL
Specific Gravity, Urine: 1.02 (ref 1.005–1.030)
Urobilinogen, UA: 1 mg/dL (ref 0.0–1.0)

## 2013-05-21 LAB — WET PREP, GENITAL
TRICH WET PREP: NONE SEEN
Yeast Wet Prep HPF POC: NONE SEEN

## 2013-05-21 LAB — POCT PREGNANCY, URINE: Preg Test, Ur: NEGATIVE

## 2013-05-21 MED ORDER — METRONIDAZOLE 500 MG PO TABS: 500.0000 mg | ORAL_TABLET | Freq: Two times a day (BID) | ORAL | Status: DC

## 2013-05-21 MED ORDER — TERCONAZOLE 0.4 % VA CREA: 1.0000 | TOPICAL_CREAM | Freq: Every day | VAGINAL | Status: DC

## 2013-05-21 MED ORDER — FLUCONAZOLE 150 MG PO TABS
150.0000 mg | ORAL_TABLET | Freq: Once | ORAL | Status: AC
  Administered 2013-05-21: 150 mg via ORAL
  Filled 2013-05-21: qty 1

## 2013-05-21 NOTE — MAU Note (Signed)
Vaginal irritation and itching X 2 days. White thick discharge, no odor.

## 2013-05-21 NOTE — MAU Provider Note (Signed)
History     CSN: [id number]  Arrival date and time: 05/21/13 5188   First Provider Initiated Contact with Patient 05/21/13 [phone]      Chief Complaint  Patient presents with  . vaginal irritation    HPI  Pt is here with report of vaginal discharge and itching x 2 days.  Desires screen for chlamydia and gonorrhea as well.    Past Medical History  Diagnosis Date  . Asthma   . Bronchitis   . Fibroid     Past Surgical History  Procedure Laterality Date  . Tubal ligation    . Dental surgery    . Bunionectomy      right foot  . Robot assisted myomectomy  03/03/2011    Procedure: ROBOTIC ASSISTED MYOMECTOMY;  Surgeon: Alwyn Pea, MD;  Location: [facility];  Service: Gynecology;  Laterality: N/A;    Family History  Problem Relation Age of Onset  . Diabetes Mother     History  Substance Use Topics  . Smoking status: Never Smoker   . Smokeless tobacco: Never Used  . Alcohol Use: No    Allergies: No Known Allergies  No prescriptions prior to admission    Review of Systems  Genitourinary:       White vaginal discharge and itching.    All other systems reviewed and are negative.  Physical Exam   Blood pressure 119/72, pulse 95, temperature 98 F (36.7 C), temperature source Oral, resp. rate 18, height 5\' 1"  (1.549 m), weight 85.73 kg (189 lb), last menstrual period 05/16/2013.  Physical Exam  Constitutional: She is oriented to person, place, and time. She appears well-developed and well-nourished.  HENT:  Head: Normocephalic.  Neck: Normal range of motion. Neck supple.  Cardiovascular: Normal rate and regular rhythm.   Respiratory: Effort normal and breath sounds normal.  GI: Soft. She exhibits no mass. There is no tenderness. There is no guarding.  Genitourinary: Cervix exhibits motion tenderness. Vaginal discharge found.  Neurological: She is alert and oriented to person, place, and time.  Skin: Skin is warm and dry.    MAU Course  Procedures Results  for orders placed during the hospital encounter of 05/21/13 (from the past 24 hour(s))  URINALYSIS, ROUTINE W REFLEX MICROSCOPIC     Status: None   Collection Time    05/21/13  7:25 AM      Result Value Ref Range   Color, Urine YELLOW  YELLOW   APPearance CLEAR  CLEAR   Specific Gravity, Urine 1.020  1.005 - 1.030   pH 6.0  5.0 - 8.0   Glucose, UA NEGATIVE  NEGATIVE mg/dL   Hgb urine dipstick NEGATIVE  NEGATIVE   Bilirubin Urine NEGATIVE  NEGATIVE   Ketones, ur NEGATIVE  NEGATIVE mg/dL   Protein, ur NEGATIVE  NEGATIVE mg/dL   Urobilinogen, UA 1.0  0.0 - 1.0 mg/dL   Nitrite NEGATIVE  NEGATIVE   Leukocytes, UA NEGATIVE  NEGATIVE  POCT PREGNANCY, URINE     Status: None   Collection Time    05/21/13  7:52 AM      Result Value Ref Range   Preg Test, Ur NEGATIVE  NEGATIVE  WET PREP, GENITAL     Status: Abnormal   Collection Time    05/21/13  7:59 AM      Result Value Ref Range   Yeast Wet Prep HPF POC NONE SEEN  NONE SEEN   Trich, Wet Prep NONE SEEN  NONE SEEN   Clue Cells  Wet Prep HPF POC FEW (*) NONE SEEN   WBC, Wet Prep HPF POC FEW (*) NONE SEEN     Assessment and Plan  Vaginal Itching Bacterial Vaginosis  Plan: Discharge to home RX Flagyl 500 mg BID x 7 days. RX Terazol cream hs x 7 days. Follow-up prn  Joelyn Oms 05/21/2013, 7:50 AM

## 2013-05-22 NOTE — MAU Provider Note (Signed)
Attestation of Attending Supervision of Advanced Practitioner (CNM/NP): Evaluation and management procedures were performed by the Advanced Practitioner under my supervision and collaboration.  I have reviewed the Advanced Practitioner's note and chart, and I agree with the management and plan.  Lavonia Drafts 3:35 PM

## 2013-07-02 DIAGNOSIS — J309 Allergic rhinitis, unspecified: Secondary | ICD-10-CM | POA: Diagnosis not present

## 2013-07-02 DIAGNOSIS — R059 Cough, unspecified: Secondary | ICD-10-CM | POA: Diagnosis not present

## 2013-07-02 DIAGNOSIS — H1045 Other chronic allergic conjunctivitis: Secondary | ICD-10-CM | POA: Diagnosis not present

## 2013-07-02 DIAGNOSIS — R05 Cough: Secondary | ICD-10-CM | POA: Diagnosis not present

## 2013-07-03 DIAGNOSIS — J309 Allergic rhinitis, unspecified: Secondary | ICD-10-CM | POA: Diagnosis not present

## 2013-07-19 ENCOUNTER — Encounter (HOSPITAL_COMMUNITY): Payer: Self-pay | Admitting: *Deleted

## 2013-07-19 ENCOUNTER — Inpatient Hospital Stay (HOSPITAL_COMMUNITY)
Admission: AD | Admit: 2013-07-19 | Discharge: 2013-07-19 | Disposition: A | Discharge status: Home / Self Care | Payer: Medicare Other | Source: Ambulatory Visit | Attending: Obstetrics and Gynecology | Admitting: Obstetrics and Gynecology

## 2013-07-19 DIAGNOSIS — N760 Acute vaginitis: Secondary | ICD-10-CM

## 2013-07-19 LAB — URINALYSIS, ROUTINE W REFLEX MICROSCOPIC
BILIRUBIN URINE: NEGATIVE
Glucose, UA: NEGATIVE mg/dL
Hgb urine dipstick: NEGATIVE
KETONES UR: NEGATIVE mg/dL
Leukocytes, UA: NEGATIVE
NITRITE: NEGATIVE
PROTEIN: NEGATIVE mg/dL
Specific Gravity, Urine: 1.02 (ref 1.005–1.030)
UROBILINOGEN UA: 1 mg/dL (ref 0.0–1.0)
pH: 6 (ref 5.0–8.0)

## 2013-07-19 LAB — WET PREP, GENITAL
Clue Cells Wet Prep HPF POC: NONE SEEN
TRICH WET PREP: NONE SEEN
Yeast Wet Prep HPF POC: NONE SEEN

## 2013-07-19 LAB — POCT PREGNANCY, URINE: Preg Test, Ur: NEGATIVE

## 2013-07-19 MED ORDER — TERCONAZOLE 0.4 % VA CREA: 1.0000 | TOPICAL_CREAM | Freq: Every day | VAGINAL | Status: DC

## 2013-07-19 NOTE — MAU Note (Signed)
Feels like her vagina is burning also reports itching and a white discharge.  No VB or cramping.

## 2013-07-19 NOTE — Discharge Instructions (Signed)

## 2013-07-19 NOTE — MAU Provider Note (Signed)
Chief Complaint: Vaginal Itching   First Provider Initiated Contact with Patient 07/19/13 [phone]     SUBJECTIVE HPI: Julia Shaw is a 38 y.o. [id number] female who presents with vaginal discharge and itching. Thinks she might have a yeast infection. Gets them frequently. Sx resolve w/ Tx. She states she is in a mutually monogamous relationship. Denies fever, chills, bleeding w/ IC. abd pain or vaginal odor. Has been seen by several different Ob/Gyn practices, but doesn't know who she wants to go to for routine Gyn care.   Poor historian.   Past Medical History  Diagnosis Date  . Asthma   . Bronchitis   . Fibroid    OB History  Gravida Para Term Preterm AB SAB TAB Ectopic Multiple Living  3 2 2  1 1    2     # Outcome Date GA Lbr Len/2nd Weight Sex Delivery Anes PTL Lv  3 SAB           2 TRM     F SVD   Y  1 TRM     F SVD   Y     Past Surgical History  Procedure Laterality Date  . Tubal ligation    . Dental surgery    . Bunionectomy      right foot  . Robot assisted myomectomy  03/03/2011    Procedure: ROBOTIC ASSISTED MYOMECTOMY;  Surgeon: Alwyn Pea, MD;  Location: [facility];  Service: Gynecology;  Laterality: N/A;   History   Social History  . Marital Status: Single    Spouse Name: N/A    Number of Children: N/A  . Years of Education: N/A   Occupational History  . Not on file.   Social History Main Topics  . Smoking status: Never Smoker   . Smokeless tobacco: Never Used  . Alcohol Use: No  . Drug Use: No  . Sexual Activity: Yes    Birth Control/ Protection: Surgical   Other Topics Concern  . Not on file   Social History Narrative  . No narrative on file   No current facility-administered medications on file prior to encounter.   Current Outpatient Prescriptions on File Prior to Encounter  Medication Sig Dispense Refill  . metroNIDAZOLE (FLAGYL) 500 MG tablet Take 1 tablet (500 mg total) by mouth 2 (two) times daily.  14 tablet  0   No Known  Allergies  ROS: Pertinent items in HPI  OBJECTIVE Blood pressure 112/72, pulse 82, temperature 98.2 F (36.8 C), temperature source Oral, resp. rate 18. GENERAL: Well-developed, well-nourished female in no acute distress. Cognitive imparement? HEENT: Normocephalic HEART: normal rate RESP: normal effort ABDOMEN: Soft, non-tender EXTREMITIES: Nontender, no edema NEURO: Alert and oriented SPECULUM EXAM: NEFG, moderate amount of creamy, white, mildly malodorous discharge, no blood noted, cervix clean BIMANUAL: cervix closed; uterus normal size, no adnexal tenderness or masses  LAB RESULTS Results for orders placed during the hospital encounter of 07/19/13 (from the past 24 hour(s))  URINALYSIS, ROUTINE W REFLEX MICROSCOPIC     Status: None   Collection Time    07/19/13  6:17 AM      Result Value Ref Range   Color, Urine YELLOW  YELLOW   APPearance CLEAR  CLEAR   Specific Gravity, Urine 1.020  1.005 - 1.030   pH 6.0  5.0 - 8.0   Glucose, UA NEGATIVE  NEGATIVE mg/dL   Hgb urine dipstick NEGATIVE  NEGATIVE   Bilirubin Urine NEGATIVE  NEGATIVE   Ketones,  ur NEGATIVE  NEGATIVE mg/dL   Protein, ur NEGATIVE  NEGATIVE mg/dL   Urobilinogen, UA 1.0  0.0 - 1.0 mg/dL   Nitrite NEGATIVE  NEGATIVE   Leukocytes, UA NEGATIVE  NEGATIVE  POCT PREGNANCY, URINE     Status: None   Collection Time    07/19/13  6:32 AM      Result Value Ref Range   Preg Test, Ur NEGATIVE  NEGATIVE  WET PREP, GENITAL     Status: Abnormal   Collection Time    07/19/13  6:58 AM      Result Value Ref Range   Yeast Wet Prep HPF POC NONE SEEN  NONE SEEN   Trich, Wet Prep NONE SEEN  NONE SEEN   Clue Cells Wet Prep HPF POC NONE SEEN  NONE SEEN   WBC, Wet Prep HPF POC MANY (*) NONE SEEN    IMAGING No results found.  MAU COURSE  ASSESSMENT 1. Vaginitis    PLAN Discharge home instable condition. Will Tx for yeast since pt experiences relief of same Sx w/ antifungals (although not a clear-cut Dx), but instructed  to F/U w/ consistent gyn provider to discuss management of recurrent VVC/BV.  Follow-up Information   Follow up with Gynecologist. (For routine gynecology care and management of recurrent yeast infections)       Follow up with [facility]. (As needed in emergencies)    Contact information:   [address] [id number] [place] [address] [phone]       Medication List    STOP taking these medications       metroNIDAZOLE 500 MG tablet  Commonly known as:  FLAGYL      TAKE these medications       terconazole 0.4 % vaginal cream  Commonly known as:  TERAZOL 7  Place 1 applicator vaginally at bedtime.       Justin, [place] 07/19/2013  7:28 AM

## 2013-07-21 LAB — GC/CHLAMYDIA PROBE AMP
CT PROBE, AMP APTIMA: NEGATIVE
GC PROBE AMP APTIMA: NEGATIVE

## 2013-07-24 NOTE — MAU Provider Note (Signed)
Attestation of Attending Supervision of Advanced Practitioner: Evaluation and management procedures were performed by the PA/NP/CNM/OB Fellow under my supervision/collaboration. Chart reviewed and agree with management and plan.  Analiyah Lechuga V 07/24/2013 6:34 AM

## 2013-09-10 IMAGING — CR DG CHEST 2V
2 series · 2 of 2 positions shown · non-contrast
Comparison: 05/04/2005.

CLINICAL DATA: Shortness of breath and cough.

CHEST - 2 VIEW

[view not recorded (1 of 2)]
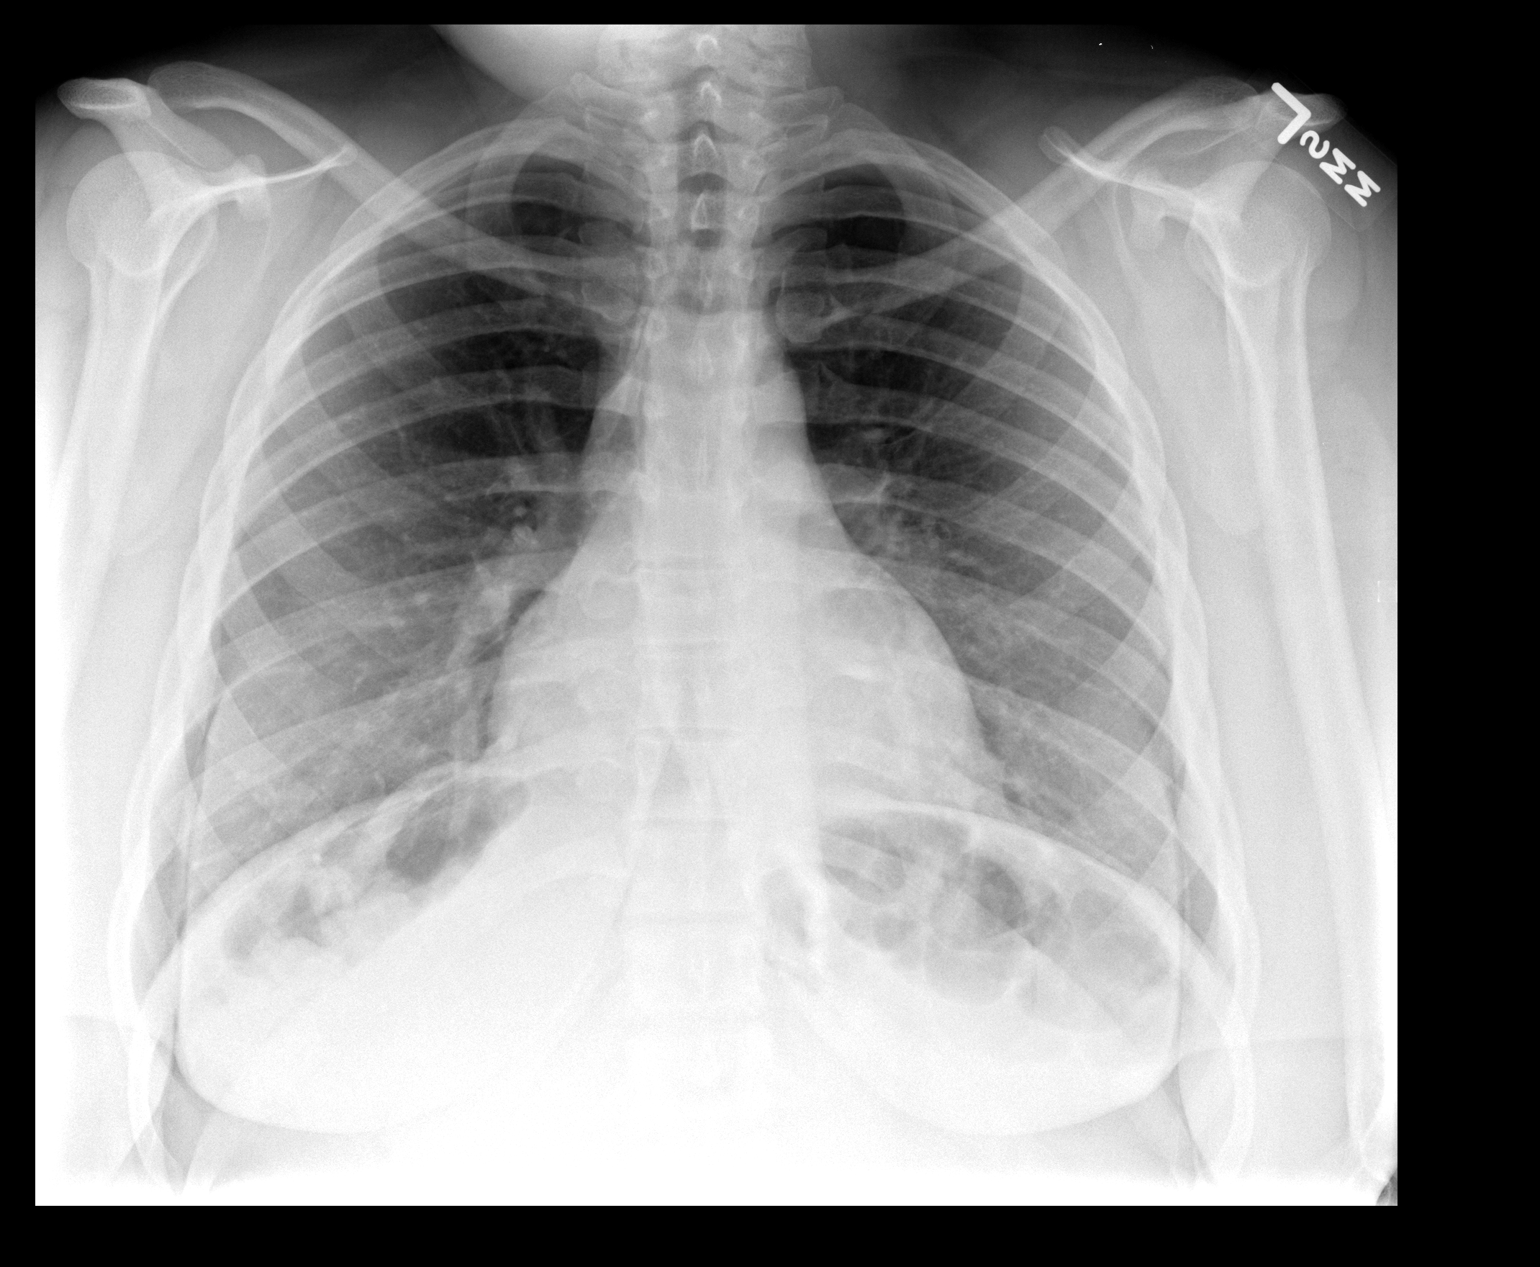

[view not recorded (2 of 2)]
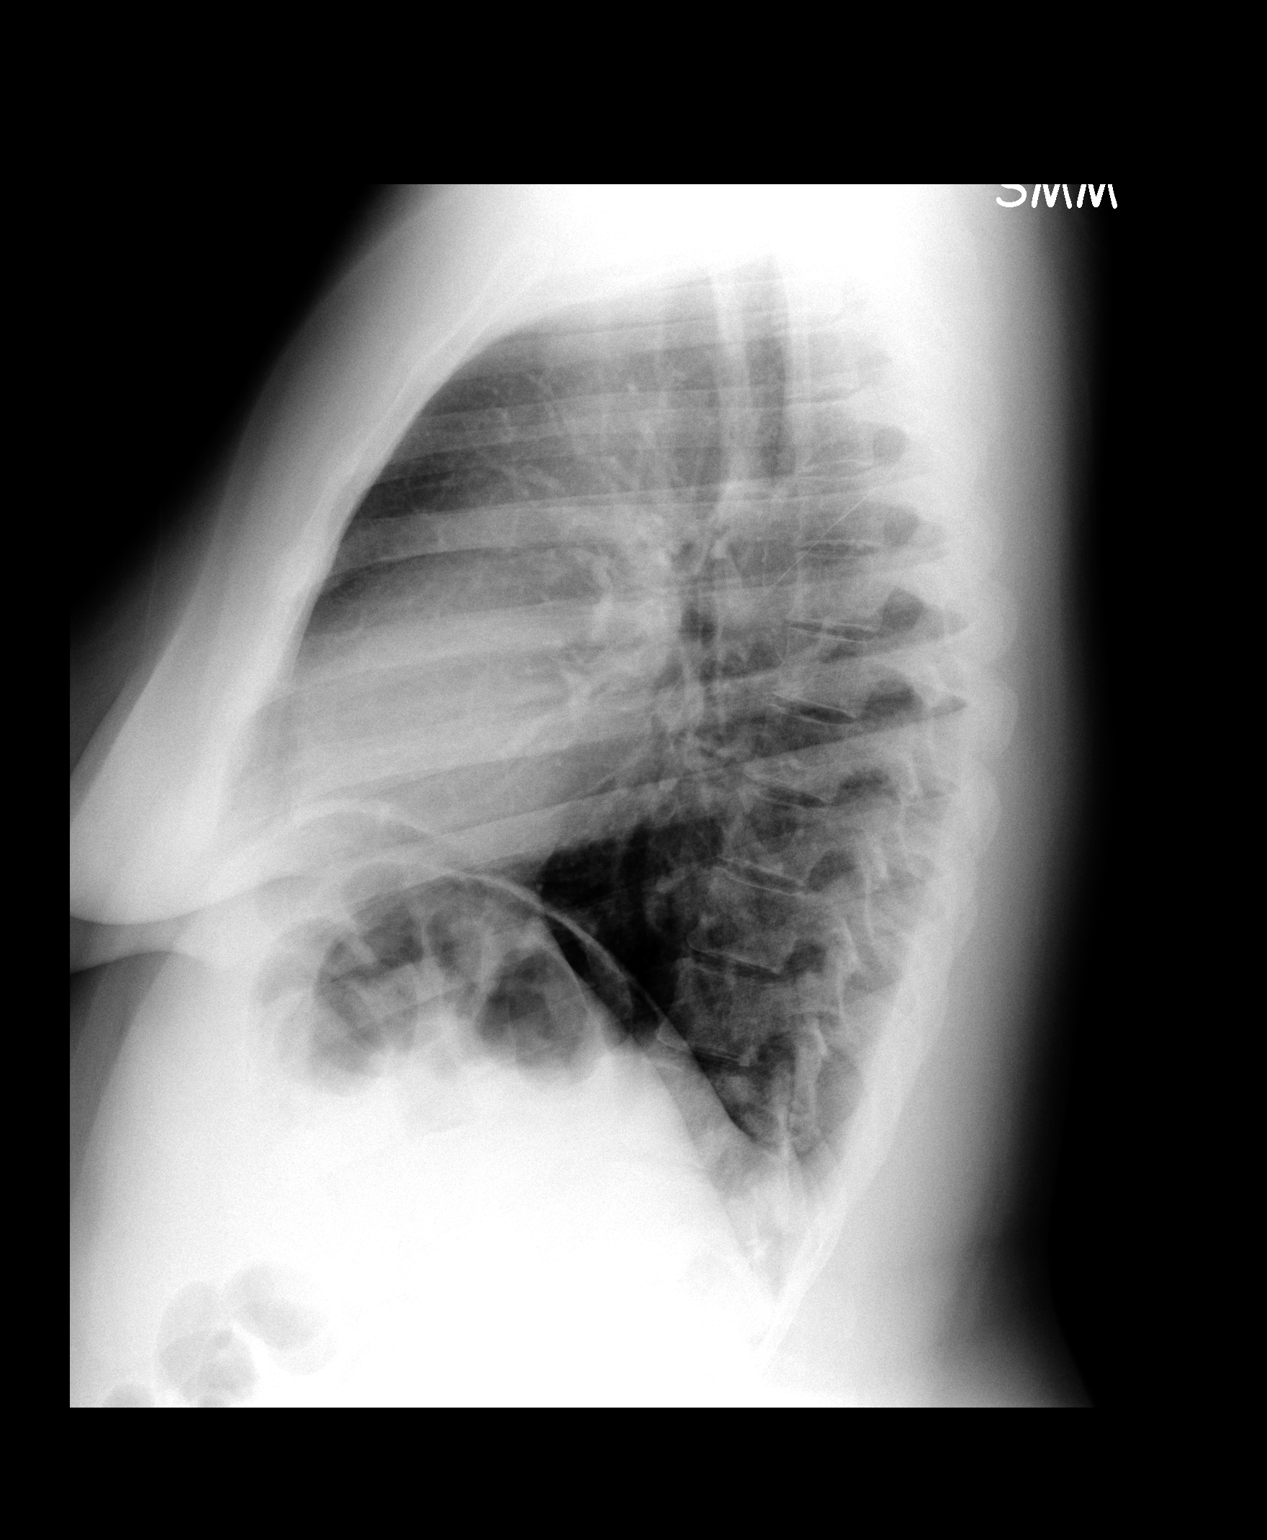

[2 of 2 positions shown; findings below may reference images not displayed]

FINDINGS: The cardiac silhouette, mediastinal and hilar contours
are within normal limits and stable.  The lungs are clear.  No
pleural effusion or pneumothorax. The bony thorax is intact.
IMPRESSION: No acute cardiopulmonary findings.

## 2013-10-01 ENCOUNTER — Encounter (HOSPITAL_COMMUNITY): Payer: Self-pay | Admitting: Emergency Medicine

## 2013-10-01 ENCOUNTER — Emergency Department (HOSPITAL_COMMUNITY)
Admission: EM | Admit: 2013-10-01 | Discharge: 2013-10-01 | Disposition: A | Discharge status: Home / Self Care | Payer: Medicare Other | Attending: Emergency Medicine | Admitting: Emergency Medicine

## 2013-10-01 ENCOUNTER — Emergency Department (HOSPITAL_COMMUNITY): Payer: Medicare Other

## 2013-10-01 DIAGNOSIS — M25571 Pain in right ankle and joints of right foot: Secondary | ICD-10-CM

## 2013-10-01 DIAGNOSIS — M25579 Pain in unspecified ankle and joints of unspecified foot: Secondary | ICD-10-CM | POA: Diagnosis not present

## 2013-10-01 DIAGNOSIS — J45909 Unspecified asthma, uncomplicated: Secondary | ICD-10-CM | POA: Insufficient documentation

## 2013-10-01 DIAGNOSIS — Z8742 Personal history of other diseases of the female genital tract: Secondary | ICD-10-CM | POA: Insufficient documentation

## 2013-10-01 LAB — I-STAT CHEM 8, ED
BUN: 4 mg/dL — AB (ref 6–23)
CHLORIDE: 106 meq/L (ref 96–112)
Calcium, Ion: 1.18 mmol/L (ref 1.12–1.23)
Creatinine, Ser: 0.6 mg/dL (ref 0.50–1.10)
Glucose, Bld: 93 mg/dL (ref 70–99)
HCT: 38 % (ref 36.0–46.0)
Hemoglobin: 12.9 g/dL (ref 12.0–15.0)
Potassium: 4.4 mEq/L (ref 3.7–5.3)
SODIUM: 138 meq/L (ref 137–147)
TCO2: 23 mmol/L (ref 0–100)

## 2013-10-01 MED ORDER — MELOXICAM 7.5 MG PO TABS: 7.5000 mg | ORAL_TABLET | Freq: Every day | ORAL | Status: DC

## 2013-10-01 NOTE — ED Notes (Signed)
Patient transported to X-ray 

## 2013-10-01 NOTE — ED Provider Notes (Signed)
CSN: [id number]     Arrival date & time 10/01/13  0714 History   First MD Initiated Contact with Patient 10/01/13 [phone]     Chief Complaint  Patient presents with  . Ankle Pain   Patient is a 38 y.o. female presenting with ankle pain. The history is provided by the patient. No language interpreter was used.  Ankle Pain Location:  Ankle Time since incident:  2 days Injury: no   Ankle location:  R ankle Pain details:    Quality:  Dull and aching   Radiates to:  Does not radiate   Severity:  Moderate   Onset quality:  Gradual   Duration:  2 days   Timing:  Constant   Progression:  Unchanged Chronicity:  New Dislocation: no   Prior injury to area: bunion surgery remotely. Relieved by:  NSAIDs Worsened by:  Bearing weight and activity Associated symptoms: decreased ROM, stiffness and swelling   Associated symptoms: no fatigue, no fever and no numbness   Risk factors: obesity   Risk factors: no concern for non-accidental trauma, no frequent fractures, no known bone disorder and no recent illness     Past Medical History  Diagnosis Date  . Asthma   . Bronchitis   . Fibroid    Past Surgical History  Procedure Laterality Date  . Tubal ligation    . Dental surgery    . Bunionectomy      right foot  . Robot assisted myomectomy  03/03/2011    Procedure: ROBOTIC ASSISTED MYOMECTOMY;  Surgeon: Alwyn Pea, MD;  Location: [facility];  Service: Gynecology;  Laterality: N/A;   Family History  Problem Relation Age of Onset  . Diabetes Mother    History  Substance Use Topics  . Smoking status: Never Smoker   . Smokeless tobacco: Never Used  . Alcohol Use: No   OB History   Grav Para Term Preterm Abortions TAB SAB Ect Mult Living   3 2 2  1  1   2      Review of Systems  Constitutional: Negative for fever, chills and fatigue.  Gastrointestinal: Negative for nausea and vomiting.  Musculoskeletal: Positive for stiffness.  All other systems reviewed and are  negative.     Allergies  Review of patient's allergies indicates no known allergies.  Home Medications   Prior to Admission medications   Medication Sig Start Date End Date Taking? Authorizing Provider  meloxicam (MOBIC) 7.5 MG tablet Take 1 tablet (7.5 mg total) by mouth daily. 10/01/13   Kabrina Christiano A Forcucci, PA-C   BP 103/75  Pulse 87  Temp(Src) 97.7 F (36.5 C) (Oral)  Resp 18  SpO2 97%  LMP 09/20/2013 Physical Exam  Nursing note and vitals reviewed. Constitutional: She is oriented to person, place, and time. She appears well-developed and well-nourished. No distress.  HENT:  Head: Normocephalic and atraumatic.  Mouth/Throat: Oropharynx is clear and moist. No oropharyngeal exudate.  Eyes: Conjunctivae are normal. No scleral icterus.  Neck: Normal range of motion. Neck supple. No JVD present. No thyromegaly present.  Cardiovascular: Normal rate, regular rhythm, normal heart sounds and intact distal pulses.  Exam reveals no gallop and no friction rub.   No murmur heard. Pulmonary/Chest: Effort normal and breath sounds normal. No respiratory distress. She has no wheezes. She has no rales. She exhibits no tenderness.  Musculoskeletal:       Right ankle: She exhibits normal range of motion, no swelling, no ecchymosis, no deformity, no laceration and normal  pulse. Tenderness. Medial malleolus tenderness found. No lateral malleolus, no AITFL, no CF ligament, no posterior TFL, no head of 5th metatarsal and no proximal fibula tenderness found. Achilles tendon normal.       Right foot: Normal. She exhibits normal range of motion, no tenderness, no bony tenderness, no swelling, normal capillary refill, no crepitus, no deformity and no laceration.  Lymphadenopathy:    She has no cervical adenopathy.  Neurological: She is alert and oriented to person, place, and time.  Skin: Skin is warm and dry. She is not diaphoretic.  Psychiatric: She has a normal mood and affect. Her behavior is  normal. Judgment and thought content normal.    ED Course  Procedures (including critical care time) Labs Review Labs Reviewed  I-STAT CHEM 8, ED - Abnormal; Notable for the following:    BUN 4 (*)    All other components within normal limits    Imaging Review Dg Ankle Complete Right  10/01/2013   CLINICAL DATA:  Right ankle pain with soft tissue swelling.  EXAM: RIGHT ANKLE - COMPLETE 3+ VIEW  COMPARISON:  None.  FINDINGS: The right ankle is located without a fracture. The patient has surgical screws involving the proximal first and second metatarsal bones. There is also a surgical wire in the distal metatarsal region.  IMPRESSION: No acute bone abnormality in the right ankle.  Postsurgical changes in the right foot.   Electronically Signed   By: Markus Daft M.D.   On: 10/01/2013 07:53     EKG Interpretation None      MDM   Final diagnoses:  Right ankle pain   Patient is a 38 y.o. Female with 2 days of right ankle pain.  Physical exam reveals neurovascularly intact right foot and ankle.  Plain film xrays are negative at this time.  Patient was placed in a right ASO brace at this time.  Given no history of SCr in system istat chem8 was ordered here at this time.  SCr function is normal.  Will prescribe the patient mobic 7.5 qDaily.  Patient was told to follow-up with her PCP.  Patient was told to return to the ED with septic joint symptoms.  Patient states understanding and agreement at this time.  Patient is stable for discharge at this time.  Patient was discussed with Dr. Tamera Punt who agrees with the above plan.     Cherylann Parr, PA-C 10/01/13 [phone]

## 2013-10-01 NOTE — ED Provider Notes (Signed)
Medical screening examination/treatment/procedure(s) were performed by non-physician practitioner and as supervising physician I was immediately available for consultation/collaboration.   EKG Interpretation None        Malvin Johns, MD 10/01/13 1003

## 2013-10-01 NOTE — ED Notes (Signed)
Phlebotomy and this RN attempted blood draw x 4, no success, hospital phlebotomist paged.

## 2013-10-01 NOTE — ED Notes (Signed)
Pt c/o right ankle pain that has been hurting for past couple of days.  Believes that could be related to standing for long periods of time at work in not the most comfortable shoes.

## 2013-10-01 NOTE — Discharge Instructions (Signed)

## 2013-10-01 NOTE — ED Notes (Signed)
Ortho tech at bedside 

## 2013-11-15 DIAGNOSIS — Z23 Encounter for immunization: Secondary | ICD-10-CM | POA: Diagnosis not present

## 2013-11-15 DIAGNOSIS — J309 Allergic rhinitis, unspecified: Secondary | ICD-10-CM | POA: Diagnosis not present

## 2013-11-15 DIAGNOSIS — J4520 Mild intermittent asthma, uncomplicated: Secondary | ICD-10-CM | POA: Diagnosis not present

## 2013-11-16 ENCOUNTER — Encounter (HOSPITAL_COMMUNITY): Payer: Self-pay

## 2013-11-16 ENCOUNTER — Inpatient Hospital Stay (HOSPITAL_COMMUNITY)
Admission: AD | Admit: 2013-11-16 | Discharge: 2013-11-16 | Disposition: A | Discharge status: Home / Self Care | Payer: Medicare Other | Source: Ambulatory Visit | Attending: Obstetrics & Gynecology | Admitting: Obstetrics & Gynecology

## 2013-11-16 DIAGNOSIS — B9689 Other specified bacterial agents as the cause of diseases classified elsewhere: Secondary | ICD-10-CM

## 2013-11-16 DIAGNOSIS — N760 Acute vaginitis: Secondary | ICD-10-CM | POA: Insufficient documentation

## 2013-11-16 DIAGNOSIS — N898 Other specified noninflammatory disorders of vagina: Secondary | ICD-10-CM | POA: Diagnosis present

## 2013-11-16 DIAGNOSIS — A499 Bacterial infection, unspecified: Secondary | ICD-10-CM | POA: Diagnosis not present

## 2013-11-16 LAB — HIV ANTIBODY (ROUTINE TESTING W REFLEX): HIV 1&2 Ab, 4th Generation: NONREACTIVE

## 2013-11-16 LAB — RPR

## 2013-11-16 LAB — WET PREP, GENITAL
TRICH WET PREP: NONE SEEN
YEAST WET PREP: NONE SEEN

## 2013-11-16 MED ORDER — METRONIDAZOLE 500 MG PO TABS: 500.0000 mg | ORAL_TABLET | Freq: Two times a day (BID) | ORAL | Status: DC

## 2013-11-16 NOTE — MAU Note (Signed)
Patient states she has had a white vaginal discharge with an odor for a couple of days. Denies pain or bleeding.

## 2013-11-16 NOTE — MAU Provider Note (Signed)
History     CSN: [id number]  Arrival date and time: 11/16/13 1093   First Provider Initiated Contact with Patient 11/16/13 0759      Chief Complaint  Patient presents with  . Vaginal Discharge   HPI Julia Shaw 38 y.o. [id number] nonpregnant female presents complaining of vaginosis.  She states she has had the same symptoms many times and it is BV.  She has white discharge with a smell and itching.  She denies vaginal bleeding, abdominal pain, fever, weakness.   OB History   Grav Para Term Preterm Abortions TAB SAB Ect Mult Living   3 2 2  1  1   2       Past Medical History  Diagnosis Date  . Asthma   . Bronchitis   . Fibroid     Past Surgical History  Procedure Laterality Date  . Tubal ligation    . Dental surgery    . Bunionectomy      right foot  . Robot assisted myomectomy  03/03/2011    Procedure: ROBOTIC ASSISTED MYOMECTOMY;  Surgeon: Alwyn Pea, MD;  Location: [facility];  Service: Gynecology;  Laterality: N/A;    Family History  Problem Relation Age of Onset  . Diabetes Mother   . Hypertension Mother   . Thyroid disease Father   . Heart disease Father   . Cancer Maternal Grandmother     History  Substance Use Topics  . Smoking status: Never Smoker   . Smokeless tobacco: Never Used  . Alcohol Use: No    Allergies: No Known Allergies  Prescriptions prior to admission  Medication Sig Dispense Refill  . albuterol (PROVENTIL HFA;VENTOLIN HFA) 108 (90 BASE) MCG/ACT inhaler Inhale 2 puffs into the lungs every 6 (six) hours as needed for wheezing or shortness of breath.      . cetirizine (ZYRTEC) 10 MG tablet Take 10 mg by mouth daily.      Marland Kitchen ibuprofen (ADVIL,MOTRIN) 200 MG tablet Take 400 mg by mouth every 6 (six) hours as needed for mild pain.      . montelukast (SINGULAIR) 10 MG tablet Take 10 mg by mouth at bedtime.        ROS Pertinent ROS in HPI Physical Exam   Blood pressure 104/66, pulse 90, temperature 98.7 F (37.1 C), temperature  source Oral, resp. rate 16, height 5' (1.524 m), weight 188 lb 12.8 oz (85.639 kg), last menstrual period 11/03/2013, SpO2 97.00%.  Physical Exam  Constitutional: She is oriented to person, place, and time. She appears well-developed and well-nourished.  HENT:  Head: Normocephalic and atraumatic.  Eyes: EOM are normal.  Neck: Normal range of motion.  Cardiovascular: Normal rate, regular rhythm and normal heart sounds.   Respiratory: Effort normal and breath sounds normal. No respiratory distress.  GI: Soft. Bowel sounds are normal. She exhibits no distension. There is no tenderness. There is no rebound.  Genitourinary:  Small to moderate amt of white homogenous, malodorous discharge.  No blood or mucus visible from os.  No CMT/no adnexal mass or tenderness  Musculoskeletal: Normal range of motion.  Neurological: She is alert and oriented to person, place, and time.  Skin: Skin is warm and dry.  Psychiatric: She has a normal mood and affect.   Results for orders placed during the hospital encounter of 11/16/13 (from the past 24 hour(s))  WET PREP, GENITAL     Status: Abnormal   Collection Time    11/16/13  8:07 AM  Result Value Ref Range   Yeast Wet Prep HPF POC NONE SEEN  NONE SEEN   Trich, Wet Prep NONE SEEN  NONE SEEN   Clue Cells Wet Prep HPF POC FEW (*) NONE SEEN   WBC, Wet Prep HPF POC FEW (*) NONE SEEN     MAU Course  Procedures  MDM Due to presence of discharge with odor and few clues, will presume BV and provide abx.  Assessment and Plan  A: Bacterial Vaginosis  P: Discharge to home Flagyl x 1 week No etoh/IC x 10 days See PCP for follow up  Return to MAU for emergency  Paticia Stack 11/16/2013, 8:02 AM

## 2013-11-16 NOTE — MAU Note (Signed)
Urine in lab 

## 2013-11-16 NOTE — Discharge Instructions (Signed)
Bacterial Vaginosis Bacterial vaginosis is a vaginal infection that occurs when the normal balance of bacteria in the vagina is disrupted. It results from an overgrowth of certain bacteria. This is the most common vaginal infection in women of childbearing age. Treatment is important to prevent complications, especially in pregnant women, as it can cause a premature delivery. CAUSES  Bacterial vaginosis is caused by an increase in harmful bacteria that are normally present in smaller amounts in the vagina. Several different kinds of bacteria can cause bacterial vaginosis. However, the reason that the condition develops is not fully understood. RISK FACTORS Certain activities or behaviors can put you at an increased risk of developing bacterial vaginosis, including:  Having a new sex partner or multiple sex partners.  Douching.  Using an intrauterine device (IUD) for contraception. Women do not get bacterial vaginosis from toilet seats, bedding, swimming pools, or contact with objects around them. SIGNS AND SYMPTOMS  Some women with bacterial vaginosis have no signs or symptoms. Common symptoms include:  Grey vaginal discharge.  A fishlike odor with discharge, especially after sexual intercourse.  Itching or burning of the vagina and vulva.  Burning or pain with urination. DIAGNOSIS  Your health care provider will take a medical history and examine the vagina for signs of bacterial vaginosis. A sample of vaginal fluid may be taken. Your health care provider will look at this sample under a microscope to check for bacteria and abnormal cells. A vaginal pH test may also be done.  TREATMENT  Bacterial vaginosis may be treated with antibiotic medicines. These may be given in the form of a pill or a vaginal cream. A second round of antibiotics may be prescribed if the condition comes back after treatment.  HOME CARE INSTRUCTIONS   Only take over-the-counter or prescription medicines as  directed by your health care provider.  If antibiotic medicine was prescribed, take it as directed. Make sure you finish it even if you start to feel better.  Do not have sex until treatment is completed.  Tell all sexual partners that you have a vaginal infection. They should see their health care provider and be treated if they have problems, such as a mild rash or itching.  Practice safe sex by using condoms and only having one sex partner. SEEK MEDICAL CARE IF:   Your symptoms are not improving after 3 days of treatment.  You have increased discharge or pain.  You have a fever. MAKE SURE YOU:   Understand these instructions.  Will watch your condition.  Will get help right away if you are not doing well or get worse. FOR MORE INFORMATION  Centers for Disease Control and Prevention, Division of STD Prevention: www.cdc.gov/std American Sexual Health Association (ASHA): www.ashastd.org  Document Released: 01/18/2005 Document Revised: 11/08/2012 Document Reviewed: 08/30/2012 ExitCare Patient Information 2015 [employer]. This information is not intended to replace advice given to you by your health care provider. Make sure you discuss any questions you have with your health care provider.  

## 2013-11-17 ENCOUNTER — Inpatient Hospital Stay (HOSPITAL_COMMUNITY)
Admission: AD | Admit: 2013-11-17 | Discharge: 2013-11-17 | Disposition: A | Discharge status: Home / Self Care | Payer: Medicare Other | Source: Ambulatory Visit | Attending: Obstetrics & Gynecology | Admitting: Obstetrics & Gynecology

## 2013-11-17 ENCOUNTER — Encounter (HOSPITAL_COMMUNITY): Payer: Self-pay | Admitting: *Deleted

## 2013-11-17 DIAGNOSIS — L293 Anogenital pruritus, unspecified: Secondary | ICD-10-CM | POA: Diagnosis present

## 2013-11-17 DIAGNOSIS — B373 Candidiasis of vulva and vagina: Secondary | ICD-10-CM | POA: Insufficient documentation

## 2013-11-17 DIAGNOSIS — B3731 Acute candidiasis of vulva and vagina: Secondary | ICD-10-CM

## 2013-11-17 LAB — GC/CHLAMYDIA PROBE AMP
CT PROBE, AMP APTIMA: NEGATIVE
GC Probe RNA: NEGATIVE

## 2013-11-17 MED ORDER — TERCONAZOLE 0.4 % VA CREA: 1.0000 | TOPICAL_CREAM | Freq: Every day | VAGINAL | Status: DC

## 2013-11-17 NOTE — MAU Note (Signed)
Thinks she has a yeast infection.  Has been itching and burning real bad down there. Started couple days ago

## 2013-11-17 NOTE — Discharge Instructions (Signed)
Get your prescription filled and use the cream on the inside of the vagina and use some cream on the outside of the vagina where you are itching.

## 2013-11-17 NOTE — MAU Provider Note (Signed)
History     CSN: [id number]  Arrival date and time: 11/17/13 1816   First Provider Initiated Contact with Patient 11/17/13 1933      Chief Complaint  Patient presents with  . Vaginal Itching   HPI Julia Shaw 38 y.o.  Was seen in MAU yesterday.  Started Metronidazole for BV but is still having itching.  OB History   Grav Para Term Preterm Abortions TAB SAB Ect Mult Living   3 2 2  1  1   2       Past Medical History  Diagnosis Date  . Asthma   . Bronchitis   . Fibroid     Past Surgical History  Procedure Laterality Date  . Tubal ligation    . Dental surgery    . Bunionectomy      right foot  . Robot assisted myomectomy  03/03/2011    Procedure: ROBOTIC ASSISTED MYOMECTOMY;  Surgeon: Alwyn Pea, MD;  Location: [facility];  Service: Gynecology;  Laterality: N/A;    Family History  Problem Relation Age of Onset  . Diabetes Mother   . Hypertension Mother   . Thyroid disease Father   . Heart disease Father   . Cancer Maternal Grandmother     History  Substance Use Topics  . Smoking status: Never Smoker   . Smokeless tobacco: Never Used  . Alcohol Use: No    Allergies: No Known Allergies  Prescriptions prior to admission  Medication Sig Dispense Refill  . albuterol (PROVENTIL HFA;VENTOLIN HFA) 108 (90 BASE) MCG/ACT inhaler Inhale 2 puffs into the lungs every 6 (six) hours as needed for wheezing or shortness of breath.      . cetirizine (ZYRTEC) 10 MG tablet Take 10 mg by mouth daily.      Marland Kitchen ibuprofen (ADVIL,MOTRIN) 200 MG tablet Take 400 mg by mouth every 6 (six) hours as needed for mild pain.      . metroNIDAZOLE (FLAGYL) 500 MG tablet Take 1 tablet (500 mg total) by mouth 2 (two) times daily.  14 tablet  0  . montelukast (SINGULAIR) 10 MG tablet Take 10 mg by mouth at bedtime.        Review of Systems  Constitutional: Negative for fever.  Gastrointestinal: Negative for abdominal pain.  Genitourinary:       Vaginal itching   Physical Exam    Blood pressure 127/75, pulse 100, temperature 98.7 F (37.1 C), temperature source Oral, resp. rate 18, height 5' (1.524 m), weight 189 lb (85.73 kg), last menstrual period 11/03/2013.  Physical Exam  Nursing note and vitals reviewed. Constitutional: She is oriented to person, place, and time. She appears well-developed and well-nourished. No distress.  HENT:  Head: Normocephalic.  Eyes: EOM are normal.  Neck: Neck supple.  Genitourinary:  External inspection - no broken skin, no rash, small amount of discharge in vaginal vault, Areas of hypopigmentation near rectum.  Dried dark yellow, golden discharge in folds between labia majora and labia minora.  Client denies using any creams to the area.  Musculoskeletal: Normal range of motion.  Neurological: She is alert and oriented to person, place, and time.  Skin: Skin is warm and dry.  Psychiatric: She has a normal mood and affect.    MAU Course  Procedures  MDM Will treat for presumptive yeast.  Had full workup yesterday.  Reviewed available lab results and note from yesterday's visit.  Assessment and Plan  Yeast infection  Plan Prescribed Terazol cream for her to  use internally and externally Continue Metronidazole Follow instructions from visit yesterday.  Julia Shaw 11/17/2013, 7:37 PM

## 2013-11-17 NOTE — MAU Provider Note (Signed)
Attestation of Attending Supervision of Advanced Practitioner (PA/CNM/NP): Evaluation and management procedures were performed by the Advanced Practitioner under my supervision and collaboration.  I have reviewed the Advanced Practitioner's note and chart, and I agree with the management and plan.  Norleen Xie, MD, FACOG Attending Obstetrician & Gynecologist Faculty Practice, [facility]   

## 2013-11-19 NOTE — MAU Provider Note (Signed)
Attestation of Attending Supervision of Advanced Practitioner (PA/CNM/NP): Evaluation and management procedures were performed by the Advanced Practitioner under my supervision and collaboration.  I have reviewed the Advanced Practitioner's note and chart, and I agree with the management and plan.  Chasten Blaze, MD, FACOG Attending Obstetrician & Gynecologist Faculty Practice, [facility]   

## 2013-12-03 ENCOUNTER — Encounter (HOSPITAL_COMMUNITY): Payer: Self-pay | Admitting: *Deleted

## 2014-02-19 DIAGNOSIS — K219 Gastro-esophageal reflux disease without esophagitis: Secondary | ICD-10-CM | POA: Diagnosis not present

## 2014-02-19 DIAGNOSIS — M25551 Pain in right hip: Secondary | ICD-10-CM | POA: Diagnosis not present

## 2014-03-07 DIAGNOSIS — J4520 Mild intermittent asthma, uncomplicated: Secondary | ICD-10-CM | POA: Diagnosis not present

## 2014-03-07 DIAGNOSIS — M25551 Pain in right hip: Secondary | ICD-10-CM | POA: Diagnosis not present

## 2014-03-07 DIAGNOSIS — K219 Gastro-esophageal reflux disease without esophagitis: Secondary | ICD-10-CM | POA: Diagnosis not present

## 2014-03-25 ENCOUNTER — Emergency Department (HOSPITAL_COMMUNITY)
Admission: EM | Admit: 2014-03-25 | Discharge: 2014-03-25 | Disposition: A | Discharge status: Home / Self Care | Payer: Medicare Other | Attending: Emergency Medicine | Admitting: Emergency Medicine

## 2014-03-25 ENCOUNTER — Encounter (HOSPITAL_COMMUNITY): Payer: Self-pay

## 2014-03-25 ENCOUNTER — Emergency Department (HOSPITAL_COMMUNITY): Payer: Medicare Other

## 2014-03-25 DIAGNOSIS — J45909 Unspecified asthma, uncomplicated: Secondary | ICD-10-CM | POA: Diagnosis not present

## 2014-03-25 DIAGNOSIS — Z8742 Personal history of other diseases of the female genital tract: Secondary | ICD-10-CM | POA: Diagnosis not present

## 2014-03-25 DIAGNOSIS — M25571 Pain in right ankle and joints of right foot: Secondary | ICD-10-CM | POA: Insufficient documentation

## 2014-03-25 DIAGNOSIS — M7989 Other specified soft tissue disorders: Secondary | ICD-10-CM | POA: Diagnosis not present

## 2014-03-25 DIAGNOSIS — Z79899 Other long term (current) drug therapy: Secondary | ICD-10-CM | POA: Diagnosis not present

## 2014-03-25 MED ORDER — HYDROCODONE-ACETAMINOPHEN 5-325 MG PO TABS
1.0000 | ORAL_TABLET | Freq: Once | ORAL | Status: AC
  Administered 2014-03-25: 1 via ORAL
  Filled 2014-03-25: qty 1

## 2014-03-25 NOTE — ED Provider Notes (Signed)
CSN: [id number]     Arrival date & time 03/25/14  8182 History   First MD Initiated Contact with Patient 03/25/14 [phone]     Chief Complaint  Patient presents with  . Ankle Pain     (Consider location/radiation/quality/duration/timing/severity/associated sxs/prior Treatment) HPI  Julia Shaw is a 39 y.o. female with PMH of asthma, ankle pain presenting with right ankle pain for 2 days. Patient denies any injury and describes the pain as sharp and achy. She states the pain is worse with ambulation and standing. Pt reports prolonged standing at work. She has tried ibuprofen without relief. Patient denies any fevers, chills nausea, vomiting. No numbness, tingling, weakness. No swelling or erythema.   Past Medical History  Diagnosis Date  . Asthma   . Bronchitis   . Fibroid    Past Surgical History  Procedure Laterality Date  . Tubal ligation    . Dental surgery    . Bunionectomy      right foot  . Robot assisted myomectomy  03/03/2011    Procedure: ROBOTIC ASSISTED MYOMECTOMY;  Surgeon: Alwyn Pea, MD;  Location: [facility];  Service: Gynecology;  Laterality: N/A;   Family History  Problem Relation Age of Onset  . Diabetes Mother   . Hypertension Mother   . Thyroid disease Father   . Heart disease Father   . Cancer Maternal Grandmother    History  Substance Use Topics  . Smoking status: Never Smoker   . Smokeless tobacco: Never Used  . Alcohol Use: No   OB History    Gravida Para Term Preterm AB TAB SAB Ectopic Multiple Living   3 2 2  1  1   2      Review of Systems  Musculoskeletal: Positive for myalgias. Negative for joint swelling.  Skin: Negative for color change and wound.  Neurological: Negative for weakness and numbness.      Allergies  Review of patient's allergies indicates no known allergies.  Home Medications   Prior to Admission medications   Medication Sig Start Date End Date Taking? Authorizing Provider  albuterol (PROVENTIL  HFA;VENTOLIN HFA) 108 (90 BASE) MCG/ACT inhaler Inhale 2 puffs into the lungs every 6 (six) hours as needed for wheezing or shortness of breath.   Yes Historical Provider, MD  cetirizine (ZYRTEC) 10 MG tablet Take 10 mg by mouth daily.   Yes Historical Provider, MD  ibuprofen (ADVIL,MOTRIN) 800 MG tablet Take 800 mg by mouth every 6 (six) hours as needed (For pain.).   Yes Historical Provider, MD  montelukast (SINGULAIR) 10 MG tablet Take 10 mg by mouth at bedtime.   Yes Historical Provider, MD  metroNIDAZOLE (FLAGYL) 500 MG tablet Take 1 tablet (500 mg total) by mouth 2 (two) times daily. Patient not taking: Reported on 03/25/2014 11/16/13   Paticia Stack, PA-C  terconazole (TERAZOL 7) 0.4 % vaginal cream Place 1 applicator vaginally at bedtime. Use for 7 days. Patient not taking: Reported on 03/25/2014 11/17/13   Virginia Rochester, NP   BP 122/72 mmHg  Pulse 74  Temp(Src) 98.2 F (36.8 C) (Oral)  Resp 18  SpO2 100%  LMP 03/05/2014 Physical Exam  Constitutional: She appears well-developed and well-nourished. No distress.  HENT:  Head: Normocephalic and atraumatic.  Eyes: Conjunctivae are normal. Right eye exhibits no discharge. Left eye exhibits no discharge.  Cardiovascular:  2+ pedal pulses.  Pulmonary/Chest: Effort normal. No respiratory distress.  Musculoskeletal:  Tenderness over right medial malleolus without swelling, erythema. No  ecchymoses. Full range of motion without tenderness. No proximal fibular tenderness.  Neurological: She is alert. Coordination normal.  5/5 strength in bilateral lower extremities. Sensation intact.  Skin: She is not diaphoretic.  Psychiatric: She has a normal mood and affect. Her behavior is normal.  Nursing note and vitals reviewed.   ED Course  Procedures (including critical care time) Labs Review Labs Reviewed - No data to display  Imaging Review Dg Ankle Complete Right  03/25/2014   CLINICAL DATA:  Right ankle pain, soft tissue  swelling  EXAM: RIGHT ANKLE - COMPLETE 3+ VIEW  COMPARISON:  10/01/2013  FINDINGS: Ankle mortise intact. The talar dome is normal. No malleolar fracture. The calcaneus is normal. Cannulated screws at the first and second tarsal metatarsal joints  IMPRESSION: No acute osseous abnormality.   Electronically Signed   By: Suzy Bouchard M.D.   On: 03/25/2014 08:22     EKG Interpretation None      MDM   Final diagnoses:  Ankle pain, right   Patient X-Ray negative for obvious fracture or dislocation. Pain managed in ED. Pt without erythema, edema or warmth to joint. FROM. Neurovascularly intact. I doubt septic arthritis. Pt advised to follow up with PCP/orthopedics if symptoms persist. Patient given brace while in ED, RICE, ibuprofen and conservative therapy recommended and discussed. Patient with improvement in NAD. She is ambulatory. Discussed wearing supported shoes.   Discussed return precautions with patient. Discussed all results and patient verbalizes understanding and agrees with plan.   Pura Spice, PA-C 03/25/14 [place], MD 03/25/14 [phone]

## 2014-03-25 NOTE — ED Notes (Signed)
Pain in ankle x 2 days.  No injury noted.  Difficult to walk.  Pt states she took ibuprofen without relief.

## 2014-03-25 NOTE — Discharge Instructions (Signed)
Return to the emergency room with worsening of symptoms, new symptoms or with symptoms that are concerning, especially fevers, redness, swelling, unable to move ankle, numbness, tearing, weakness. RICE: Rest, Ice (three cycles of 20 mins on, 69mins off at least twice a day), compression/brace, elevation. Heating pad works well for back pain. Ibuprofen 400mg  (2 tablets 200mg ) every 5-6 hours for 3-5 days. Follow up with PCP/orthopedist if symptoms worsen or are persistent. Call to make appointment with orthopedics for persistent or worsening symptoms. Number provided above. Read below information and follow recommendations.   Chronic Ankle Instability with Rehab Chronic ankle instability is characterized by instability of the ankle for a prolonged period of time. There are two types of ankle instability.   A functionally unstable ankle is one that gives way; however, it may or may not be loose.  A mechanically unstable ankle is one that is loose due to a problem with the ligaments. However, not all loose ankles are unstable or give way. SYMPTOMS   Recurrent ankle pain and giving way of the ankle.  Difficulty running on uneven surfaces, jumping, or changing directions while running (cutting).  Pain, tenderness, swelling, and bruising at the site of injury.  Weakness or looseness in the ankle joint.  Occasionally, impaired ability to walk soon after injury. CAUSES   Ankle instability is most commonly caused by a previous ankle injury that did not completely heal.  Ankle instability may also be caused by stress imposed from either side of the ankle joint that can temporarily force or pry the ankle bone (talus) out of its normal alignment. The ligaments that hold the joint in place are stretched and torn. RISK INCREASES WITH:  Previous ankle injury.  You were born with (congenital) joint looseness.  Too-rapid return to activity after previous ankle sprain.  Activities in which the  foot may land sideways while running, walking, and jumping (basketball, volleyball, or soccer) or walking or running on uneven or rough surfaces.  Inadequate ankle support during athletics.  Poor strength and flexibility.  Poor balance skills. PREVENTION  Warm up and stretch properly before activity.  Maintain physical fitness:  Ankle and leg flexibility, muscle strength, and endurance.  Balanced training activities.  Cardiovascular fitness.  Learn and use proper technique during sports and have a coach correct improper technique.  Taping, protective strapping, bracing, or high-top tennis shoes may be used. Initially, tape is best; however, it loses most of its support function within 10 to 15 minutes.  Wear proper protective shoes (high-top shoes with taping or bracing).  Provide the ankle with support during sports and practice activities for 12 months following injury.  Complete rehabilitation after initial injury. PROGNOSIS  If treated properly, ankle instability normally resolves with non-surgical treatment. However, for certain cases of mechanical instability surgery is necessary. RELATED COMPLICATIONS   Frequent recurrence of symptoms is possible. Following rehabilitation guidelines correctly decreases the frequency of recurrence and optimizes healing time.  Injury to other structures (bone, cartilage, or tendon).  Chronically unstable or arthritic ankle joint.  Complications of surgery including infection, bleeding, injury to nerves, continued giving way, ankle stiffness, and ankle weakness. TREATMENT Treatment initially involves ice, medication, and compression bandages are used to help reduce pain and inflammation, It may be necessary to immobilize the joint for a period of time to allow for healing. Strengthening and stretching exercises are recommended after immobilization to help regain strength and flexibility. These exercises may be completed at home or with a  therapist. Some individuals  find placing a heel wedge in the shoe, taping or bracing, and wearing high-top shoes helpful. If symptoms last for longer than 3 months, despite treatment, then surgery may be recommended. HEAT AND COLD  Cold treatment (icing) relieves pain and reduces inflammation. Cold treatment should be applied for 10 to 15 minutes every 2 to 3 hours for inflammation and pain and immediately after any activity that aggravates your symptoms. Use ice packs or an ice massage.  Heat treatment may be used prior to performing the stretching and strengthening activities prescribed by your caregiver, physical therapist, or athletic trainer. Use a heat pack or a warm soak. MEDICATION   There are no specific medications to improve the stability of your ankle.  If pain medication is necessary, then nonsteroidal anti-inflammatory medications, such as aspirin and ibuprofen, or other minor pain relievers, such as acetaminophen, are often recommended.  Do not take pain medication within 7 days before surgery.  Prescription pain relievers may be prescribed if deemed necessary by your caregiver. Use only as directed and only as much as you need.  Ointments applied to the skin may be helpful. SEEK MEDICAL CARE IF:   Pain, swelling, or bruising worsens despite treatment.  You develop locking or catching in the ankle.  You have pain, numbness, or coldness in the foot.  You develop giving way of the ankle which persists after 3 to 6 months of rehabilitation. EXERCISES  RANGE OF MOTION AND STRETCHING EXERCISES - Ankle Instability, Chronic, Non-Surgical Intervention Since ankles demonstrate instability when they have too much motion throughout the joints, range of motion and stretching exercises are not helpful and can even be harmful. Only complete range of motion and stretching exercises for your ankle if instructed by your physician, physical therapist or athletic trainer. An effective  rehabilitation program for unstable ankles will include mostly strengthening and balance exercises. STRENGTHENING EXERCISES - Ankle Instability, Chronic, Non-Surgical Intervention  These exercises may help you when beginning to rehabilitate your injury. They may resolve your symptoms with or without further involvement from your physician, physical therapist or athletic trainer. While completing these exercises, remember:  Muscles can gain both the endurance and the strength needed for everyday activities through controlled exercises.  Complete these exercises as instructed by your physician, physical therapist or athletic trainer. Progress the resistance and repetitions only as guided.  You may experience muscle soreness or fatigue, but the pain or discomfort you are trying to eliminate should never worsen during these exercises. If this pain does worsen, stop and make certain you are following the directions exactly. If the pain is still present after adjustments, discontinue the exercise until you can discuss the trouble with your clinician. STRENGTH - Dorsiflexors  Secure a rubber exercise band/tubing to a fixed object (table, pole) and loop the other end around your right / left foot.  Sit on the floor facing the fixed object. The band/tubing should be slightly tense when your foot is relaxed.  Slowly draw your foot back toward you using your ankle and toes.  Hold this position for __________ seconds. Slowly release the tension in the band and return your foot to the starting position. Repeat __________ times. Complete this exercise __________ times per day.  STRENGTH - Plantar-flexors  Sit with your right / left leg extended. Holding onto both ends of a rubber exercise band/tubing, loop it around the ball of your foot. Keep a slight tension in the band.  Slowly push your toes away from you, pointing them downward.  Hold this position for __________ seconds. Return slowly, controlling  the tension in the band/tubing. Repeat __________ times. Complete this exercise __________ times per day.  STRENGTH - Plantar-flexors, Standing   Stand with your feet shoulder width apart. Steady yourself with a wall or table using as little support as needed.  Keeping your weight evenly spread over the width of your feet, rise up on your toes.*  Hold this position for __________ seconds. Repeat __________ times. Complete this exercise __________ times per day.  *If this is too easy, shift your weight toward your right / left leg until you feel challenged. Ultimately, you may be asked to do this exercise with your right / left foot only. STRENGTH - Ankle Eversion  Secure one end of a rubber exercise band/tubing to a fixed object (table, pole). Loop the other end around your foot just before your toes.  Place your fists between your knees. This will focus your strengthening at your ankle.  Drawing the band/tubing across your opposite foot, slowly, pull your little toe out and up. Make sure the band/tubing is positioned to resist the entire motion.  Hold this position for __________ seconds.  Have your muscles resist the band/tubing as it slowly pulls your foot back to the starting position. Repeat __________ times. Complete this exercise __________ times per day.  STRENGTH - Ankle Inversion  Secure one end of a rubber exercise band/tubing to a fixed object (table, pole). Loop the other end around your foot just before your toes.  Place your fists between your knees. This will focus your strengthening at your ankle.  Slowly, pull your big toe up and in, making sure the band/tubing is positioned to resist the entire motion.  Hold this position for __________ seconds.  Have your muscles resist the band/tubing as it slowly pulls your foot back to the starting position. Repeat __________ times. Complete this exercises __________ times per day.  STRENGTH - Towel Curls  Sit in a chair  positioned on a non-carpeted surface.  Place your foot on a towel, keeping your heel on the floor.  Pull the towel toward your heel by only curling your toes. Keep your heel on the floor.  If instructed by your physician, physical therapist or athletic trainer, add weight to the end of the towel. Repeat __________ times. Complete this exercise __________ times per day. STRENGTH - Dorsiflexors and Plantar-flexors, Heel/toe Walking  Dorsiflexion: Walk on your heels only. Keep your toes as high as possible.  Repeat __________ times. Complete __________ times per day.  Plantar flexion: Walk on your toes only. Keep your heels as high as possible.  Walk for ____________________ seconds/feet. Repeat __________ times. Complete __________ times per day.  BALANCE - Tandem Walking  Place your uninjured foot on a line 2-4 inches wide and at least 10 feet long.  Keeping your balance without using anything for extra support, place your right / left heel directly in front of your other foot.  Slowly raise your back foot up, lifting from the heel to the toes, and place it directly in front of the right / left foot.  Continue to walk along the line slowly. Walk for ____________________ feet. Repeat ____________________ times. Complete ____________________ times per day. BALANCE - Inversion/Eversion Use caution, these are advanced level exercises. Do not begin them until you are advised to do so.   Create a balance board using a sturdy board about 1  feet long and at 1-1  feet wide and a 1  inch diameter rod or pipe that is as long as the board's width. A copper pipe or a solid broomstick work well.  Stand on a non-carpeted surface near a countertop or wall. Step onto the board so that your feet are hip-width apart and equally straddle the rod/pipe.  Keeping your feet in place, complete these two exercises without shifting your upper body or hips:  Tip the board from side-to-side. Control the  movement so the board does not forcefully strike the ground. The board should silently tap the ground.  Tip the board side-to-side without striking the ground. Occasionally pause and maintain a steady position at various points.  Repeat the first two exercises, but use only your right / left foot. Place your right / left foot directly over the rod/pipe. Repeat __________ times. Complete this exercise __________ times a day. BALANCE - Plantar/Dorsi Flexion Use caution, these are advanced level exercises. Do not begin them until you are advised to do so.  Create a balance board using a sturdy board about 1  feet long and at 1-1  feet wide and a 1  inch diameter rod or pipe that is as long as the board's width. A copper pipe or a solid broomstick work well.  Stand on a non-carpeted surface near a countertop or wall. Stand on the board so that the rod/pipe runs under the arches in your feet.  Keeping your feet in place, complete these two exercises without shifting your upper body or hips:  Tip the board from side-to-side. Control the movement so the board does not forcefully strike the ground. The board should silently tap the ground.  Tip the board side-to-side without striking the ground. Occasionally pause and maintain a steady position at various points.  Repeat the first two exercises, but use only your right / left foot. Stand in the center of the board. Repeat __________ times. Complete this exercise __________ times a day. STRENGTH - Plantar-flexors, Eccentric Note: This exercise can place a lot of stress on your foot and ankle. Please complete this exercise only if specifically instructed by your caregiver.   Place the balls of your feet on a step. With your hands, use only enough support from a wall or rail to keep your balance.  Keep your knees straight and rise up on your toes.  Slowly shift your weight entirely to your toes and pick up your opposite foot. Gently and with  controlled movement, lower your weight through your right / left foot so that your heel drops below the level of the step. You will feel a slight stretch in the back of your calf at the ending position.  Use the healthy leg to help rise up onto the balls of both feet, then lower weight only on the right / left leg again. Build up to 15 repetitions. Then progress to 3 consecutive sets of 15 repetitions.*  After completing the above exercise, complete the same exercise with a slight knee bend (about 30 degrees). Again, build up to 15 repetitions. Then progress to 3 consecutive sets of 15 repetitions.* Perform this exercise __________ times per day.  *When you easily complete 3 sets of 15, your physician, physical therapist or athletic trainer may advise you to add resistance by wearing a backpack filled with additional weight. Document Released: 08/19/2004 Document Revised: 04/12/2011 Document Reviewed: 05/02/2008 [facility] Patient Information 2015 [place], [place]. This information is not intended to replace advice given to you by your health care provider. Make sure you discuss  any questions you have with your health care provider. ° °

## 2014-05-02 DIAGNOSIS — M25571 Pain in right ankle and joints of right foot: Secondary | ICD-10-CM | POA: Diagnosis not present

## 2014-05-02 DIAGNOSIS — M79672 Pain in left foot: Secondary | ICD-10-CM | POA: Diagnosis not present

## 2014-05-02 DIAGNOSIS — M65871 Other synovitis and tenosynovitis, right ankle and foot: Secondary | ICD-10-CM | POA: Diagnosis not present

## 2014-05-02 DIAGNOSIS — M71571 Other bursitis, not elsewhere classified, right ankle and foot: Secondary | ICD-10-CM | POA: Diagnosis not present

## 2014-05-10 DIAGNOSIS — M2012 Hallux valgus (acquired), left foot: Secondary | ICD-10-CM | POA: Diagnosis not present

## 2014-05-10 DIAGNOSIS — M71571 Other bursitis, not elsewhere classified, right ankle and foot: Secondary | ICD-10-CM | POA: Diagnosis not present

## 2014-05-10 DIAGNOSIS — M65871 Other synovitis and tenosynovitis, right ankle and foot: Secondary | ICD-10-CM | POA: Diagnosis not present

## 2014-05-24 DIAGNOSIS — M2021 Hallux rigidus, right foot: Secondary | ICD-10-CM | POA: Diagnosis not present

## 2014-09-17 DIAGNOSIS — M7752 Other enthesopathy of left foot: Secondary | ICD-10-CM | POA: Diagnosis not present

## 2014-09-17 DIAGNOSIS — M71571 Other bursitis, not elsewhere classified, right ankle and foot: Secondary | ICD-10-CM | POA: Diagnosis not present

## 2014-09-17 DIAGNOSIS — M1990 Unspecified osteoarthritis, unspecified site: Secondary | ICD-10-CM | POA: Diagnosis not present

## 2014-09-17 DIAGNOSIS — M71572 Other bursitis, not elsewhere classified, left ankle and foot: Secondary | ICD-10-CM | POA: Diagnosis not present

## 2014-09-17 DIAGNOSIS — M7751 Other enthesopathy of right foot: Secondary | ICD-10-CM | POA: Diagnosis not present

## 2014-10-25 DIAGNOSIS — M25551 Pain in right hip: Secondary | ICD-10-CM | POA: Diagnosis not present

## 2014-10-25 DIAGNOSIS — S76011A Strain of muscle, fascia and tendon of right hip, initial encounter: Secondary | ICD-10-CM | POA: Diagnosis not present

## 2015-01-30 DIAGNOSIS — M659 Synovitis and tenosynovitis, unspecified: Secondary | ICD-10-CM | POA: Diagnosis not present

## 2015-01-30 DIAGNOSIS — T148 Other injury of unspecified body region: Secondary | ICD-10-CM | POA: Diagnosis not present

## 2015-01-30 DIAGNOSIS — M7751 Other enthesopathy of right foot: Secondary | ICD-10-CM | POA: Diagnosis not present

## 2015-01-30 DIAGNOSIS — M2580 Other specified joint disorders, unspecified joint: Secondary | ICD-10-CM | POA: Diagnosis not present

## 2015-01-30 DIAGNOSIS — M7752 Other enthesopathy of left foot: Secondary | ICD-10-CM | POA: Diagnosis not present

## 2015-02-04 DIAGNOSIS — T148 Other injury of unspecified body region: Secondary | ICD-10-CM | POA: Diagnosis not present

## 2015-02-25 DIAGNOSIS — M205X1 Other deformities of toe(s) (acquired), right foot: Secondary | ICD-10-CM | POA: Diagnosis not present

## 2015-02-26 ENCOUNTER — Emergency Department (HOSPITAL_COMMUNITY)
Admission: EM | Admit: 2015-02-26 | Discharge: 2015-02-26 | Disposition: A | Discharge status: Home / Self Care | Payer: Medicare Other | Attending: Emergency Medicine | Admitting: Emergency Medicine

## 2015-02-26 ENCOUNTER — Encounter (HOSPITAL_COMMUNITY): Payer: Self-pay | Admitting: *Deleted

## 2015-02-26 ENCOUNTER — Emergency Department (HOSPITAL_COMMUNITY): Payer: Medicare Other

## 2015-02-26 DIAGNOSIS — Z79899 Other long term (current) drug therapy: Secondary | ICD-10-CM | POA: Diagnosis not present

## 2015-02-26 DIAGNOSIS — J45909 Unspecified asthma, uncomplicated: Secondary | ICD-10-CM | POA: Diagnosis not present

## 2015-02-26 DIAGNOSIS — M25571 Pain in right ankle and joints of right foot: Secondary | ICD-10-CM | POA: Diagnosis not present

## 2015-02-26 DIAGNOSIS — Z86010 Personal history of colonic polyps: Secondary | ICD-10-CM | POA: Insufficient documentation

## 2015-02-26 MED ORDER — TRAMADOL HCL 50 MG PO TABS: 50.0000 mg | ORAL_TABLET | Freq: Four times a day (QID) | ORAL | Status: DC | PRN

## 2015-02-26 MED ORDER — NAPROXEN 500 MG PO TABS: 500.0000 mg | ORAL_TABLET | Freq: Two times a day (BID) | ORAL | Status: DC

## 2015-02-26 NOTE — ED Notes (Signed)
Pt states that she has had right ankle swelling for a few weeks; pt denies injury to ankle; pt c/o discomfort to ankle

## 2015-02-26 NOTE — Discharge Instructions (Signed)
Naproxen as prescribed.  Tramadol as needed for pain not relieved with naproxen.  Wear Ace bandage for comfort and support.  Follow-up with orthopedics if not improving in the next week. The contact information for Dr. Lyla Glassing has been provided in this discharge summary for you to call and arrange this appointment.   Ankle Pain Ankle pain is a common symptom. The bones, cartilage, tendons, and muscles of the ankle joint perform a lot of work each day. The ankle joint holds your body weight and allows you to move around. Ankle pain can occur on either side or back of 1 or both ankles. Ankle pain may be sharp and burning or dull and aching. There may be tenderness, stiffness, redness, or warmth around the ankle. The pain occurs more often when a person walks or puts pressure on the ankle. CAUSES  There are many reasons ankle pain can develop. It is important to work with your caregiver to identify the cause since many conditions can impact the bones, cartilage, muscles, and tendons. Causes for ankle pain include:  Injury, including a break (fracture), sprain, or strain often due to a fall, sports, or a high-impact activity.  Swelling (inflammation) of a tendon (tendonitis).  Achilles tendon rupture.  Ankle instability after repeated sprains and strains.  Poor foot alignment.  Pressure on a nerve (tarsal tunnel syndrome).  Arthritis in the ankle or the lining of the ankle.  Crystal formation in the ankle (gout or pseudogout). DIAGNOSIS  A diagnosis is based on your medical history, your symptoms, results of your physical exam, and results of diagnostic tests. Diagnostic tests may include X-ray exams or a computerized magnetic scan (magnetic resonance imaging, MRI). TREATMENT  Treatment will depend on the cause of your ankle pain and may include:  Keeping pressure off the ankle and limiting activities.  Using crutches or other walking support (a cane or brace).  Using rest, ice,  compression, and elevation.  Participating in physical therapy or home exercises.  Wearing shoe inserts or special shoes.  Losing weight.  Taking medications to reduce pain or swelling or receiving an injection.  Undergoing surgery. HOME CARE INSTRUCTIONS   Only take over-the-counter or prescription medicines for pain, discomfort, or fever as directed by your caregiver.  Put ice on the injured area.  Put ice in a plastic bag.  Place a towel between your skin and the bag.  Leave the ice on for 15-20 minutes at a time, 03-04 times a day.  Keep your leg raised (elevated) when possible to lessen swelling.  Avoid activities that cause ankle pain.  Follow specific exercises as directed by your caregiver.  Record how often you have ankle pain, the location of the pain, and what it feels like. This information may be helpful to you and your caregiver.  Ask your caregiver about returning to work or sports and whether you should drive.  Follow up with your caregiver for further examination, therapy, or testing as directed. SEEK MEDICAL CARE IF:   Pain or swelling continues or worsens beyond 1 week.  You have an oral temperature above 102 F (38.9 C).  You are feeling unwell or have chills.  You are having an increasingly difficult time with walking.  You have loss of sensation or other new symptoms.  You have questions or concerns. MAKE SURE YOU:   Understand these instructions.  Will watch your condition.  Will get help right away if you are not doing well or get worse.   This  information is not intended to replace advice given to you by your health care provider. Make sure you discuss any questions you have with your health care provider.   Document Released: 07/08/2009 Document Revised: 04/12/2011 Document Reviewed: 08/20/2014 [employer] Interactive Patient Education [employer].

## 2015-02-26 NOTE — ED Provider Notes (Signed)
CSN: [id number]     Arrival date & time 02/26/15  [id number] History   First MD Initiated Contact with Patient 02/26/15 [phone]     Chief Complaint  Patient presents with  . Joint Swelling     (Consider location/radiation/quality/duration/timing/severity/associated sxs/prior Treatment) HPI Comments: Patient is a 40 year old female with history of asthma and bronchitis. She presents for evaluation of right ankle pain and swelling. This is been occurring for a few weeks and is worse with standing and ambulation. She denies any specific injury or trauma. She denies any redness, fevers, or chills.  Patient is a 40 y.o. female presenting with ankle pain. The history is provided by the patient.  Ankle Pain Location:  Ankle Time since incident:  2 weeks Injury: no   Ankle location:  R ankle Pain details:    Quality:  Sharp   Radiates to:  Does not radiate   Severity:  Moderate   Timing:  Constant   Progression:  Worsening   Past Medical History  Diagnosis Date  . Asthma   . Bronchitis   . Fibroid    Past Surgical History  Procedure Laterality Date  . Tubal ligation    . Dental surgery    . Bunionectomy      right foot  . Robot assisted myomectomy  03/03/2011    Procedure: ROBOTIC ASSISTED MYOMECTOMY;  Surgeon: Alwyn Pea, MD;  Location: [facility];  Service: Gynecology;  Laterality: N/A;   Family History  Problem Relation Age of Onset  . Diabetes Mother   . Hypertension Mother   . Thyroid disease Father   . Heart disease Father   . Cancer Maternal Grandmother    Social History  Substance Use Topics  . Smoking status: Never Smoker   . Smokeless tobacco: Never Used  . Alcohol Use: No   OB History    Gravida Para Term Preterm AB TAB SAB Ectopic Multiple Living   3 2 2  1  1   2      Review of Systems  All other systems reviewed and are negative.     Allergies  Review of patient's allergies indicates no known allergies.  Home Medications   Prior to Admission  medications   Medication Sig Start Date End Date Taking? Authorizing Provider  albuterol (PROVENTIL HFA;VENTOLIN HFA) 108 (90 BASE) MCG/ACT inhaler Inhale 2 puffs into the lungs every 6 (six) hours as needed for wheezing or shortness of breath.    Historical Provider, MD  cetirizine (ZYRTEC) 10 MG tablet Take 10 mg by mouth daily.    Historical Provider, MD  ibuprofen (ADVIL,MOTRIN) 800 MG tablet Take 800 mg by mouth every 6 (six) hours as needed (For pain.).    Historical Provider, MD  metroNIDAZOLE (FLAGYL) 500 MG tablet Take 1 tablet (500 mg total) by mouth 2 (two) times daily. Patient not taking: Reported on 03/25/2014 11/16/13   Collene Leyden Teague Clark, PA-C  montelukast (SINGULAIR) 10 MG tablet Take 10 mg by mouth at bedtime.    Historical Provider, MD  terconazole (TERAZOL 7) 0.4 % vaginal cream Place 1 applicator vaginally at bedtime. Use for 7 days. Patient not taking: Reported on 03/25/2014 11/17/13   Virginia Rochester, NP   BP 124/62 mmHg  Pulse 73  Temp(Src) 98.2 F (36.8 C) (Oral)  Resp 18  SpO2 98%  LMP 02/11/2015 Physical Exam  Constitutional: She is oriented to person, place, and time. She appears well-developed and well-nourished. No distress.  HENT:  Head: Normocephalic and  atraumatic.  Neck: Normal range of motion. Neck supple.  Musculoskeletal:  The right ankle appears grossly normal. There is no swelling, redness, warmth, or deformity that I can appreciate. She has good range of motion in the ankle joint is stable.  Neurological: She is alert and oriented to person, place, and time.  Skin: Skin is warm and dry. She is not diaphoretic.  Nursing note and vitals reviewed.   ED Course  Procedures (including critical care time) Labs Review Labs Reviewed - No data to display  Imaging Review Dg Ankle Complete Right  02/26/2015  CLINICAL DATA:  Subacute onset of medial right ankle pain. Initial encounter. EXAM: RIGHT ANKLE - COMPLETE 3+ VIEW COMPARISON:  Right ankle  radiographs from 03/25/2014 FINDINGS: There is no evidence of fracture or dislocation. The ankle mortise is intact; the interosseous space is within normal limits. No talar tilt or subluxation is seen. Screws are noted along the bases of the first and second metatarsals. The joint spaces are preserved. No significant soft tissue abnormalities are seen. IMPRESSION: No evidence of fracture or dislocation. Electronically Signed   By: Garald Balding M.D.   On: 02/26/2015 06:29   I have personally reviewed and evaluated these images and lab results as part of my medical decision-making.   EKG Interpretation None      MDM   Final diagnoses:  None    X-rays reveal no evidence for fracture. She does have 2 screws in her foot from prior surgery, however these are not in the same area where she is complaining of her discomfort. She will be treated with anti-inflammatory's, pain medication, rest, Ace bandage, and follow-up with orthopedics if not improving.    Veryl Speak, MD 02/26/15 [phone]

## 2015-03-15 DIAGNOSIS — M25571 Pain in right ankle and joints of right foot: Secondary | ICD-10-CM | POA: Diagnosis not present

## 2015-03-15 DIAGNOSIS — M7989 Other specified soft tissue disorders: Secondary | ICD-10-CM | POA: Diagnosis not present

## 2015-03-21 DIAGNOSIS — M79671 Pain in right foot: Secondary | ICD-10-CM | POA: Diagnosis not present

## 2015-03-21 DIAGNOSIS — M25471 Effusion, right ankle: Secondary | ICD-10-CM | POA: Diagnosis not present

## 2015-05-19 ENCOUNTER — Encounter (HOSPITAL_COMMUNITY): Payer: Self-pay

## 2015-05-19 ENCOUNTER — Ambulatory Visit (HOSPITAL_COMMUNITY)
Admission: EM | Admit: 2015-05-19 | Discharge: 2015-05-19 | Disposition: A | Discharge status: Home / Self Care | Payer: Medicare Other | Attending: Family Medicine | Admitting: Family Medicine

## 2015-05-19 DIAGNOSIS — J45909 Unspecified asthma, uncomplicated: Secondary | ICD-10-CM | POA: Diagnosis not present

## 2015-05-19 MED ORDER — ALBUTEROL SULFATE HFA 108 (90 BASE) MCG/ACT IN AERS: 2.0000 | INHALATION_SPRAY | Freq: Four times a day (QID) | RESPIRATORY_TRACT | Status: DC | PRN

## 2015-05-19 MED ORDER — ALBUTEROL SULFATE (2.5 MG/3ML) 0.083% IN NEBU
INHALATION_SOLUTION | RESPIRATORY_TRACT | Status: AC
  Filled 2015-05-19: qty 3

## 2015-05-19 MED ORDER — IPRATROPIUM BROMIDE 0.02 % IN SOLN
RESPIRATORY_TRACT | Status: AC
  Filled 2015-05-19: qty 2.5

## 2015-05-19 MED ORDER — METHYLPREDNISOLONE SODIUM SUCC 125 MG IJ SOLR
INTRAMUSCULAR | Status: AC
  Filled 2015-05-19: qty 2

## 2015-05-19 MED ORDER — METHYLPREDNISOLONE 4 MG PO TBPK
ORAL_TABLET | ORAL | Status: DC
Start: 2015-05-19 — End: 2015-08-30

## 2015-05-19 MED ORDER — IPRATROPIUM BROMIDE 0.02 % IN SOLN
0.5000 mg | Freq: Once | RESPIRATORY_TRACT | Status: AC
  Administered 2015-05-19: 0.5 mg via RESPIRATORY_TRACT

## 2015-05-19 MED ORDER — FLUTICASONE PROPIONATE 50 MCG/ACT NA SUSP: 1.0000 | Freq: Two times a day (BID) | NASAL | Status: DC

## 2015-05-19 MED ORDER — METHYLPREDNISOLONE SODIUM SUCC 125 MG IJ SOLR
125.0000 mg | Freq: Once | INTRAMUSCULAR | Status: AC
  Administered 2015-05-19: 125 mg via INTRAMUSCULAR

## 2015-05-19 MED ORDER — ALBUTEROL SULFATE (2.5 MG/3ML) 0.083% IN NEBU
5.0000 mg | INHALATION_SOLUTION | Freq: Once | RESPIRATORY_TRACT | Status: AC
  Administered 2015-05-19: 5 mg via RESPIRATORY_TRACT

## 2015-05-19 NOTE — ED Provider Notes (Signed)
CSN: [id number]     Arrival date & time 05/19/15  1436 History   First MD Initiated Contact with Patient 05/19/15 1709     Chief Complaint  Patient presents with  . Asthma   (Consider location/radiation/quality/duration/timing/severity/associated sxs/prior Treatment) Patient is a 40 y.o. female presenting with asthma. The history is provided by the patient.  Asthma This is a chronic problem. The current episode started more than 2 days ago. The problem has been gradually worsening. Associated symptoms include shortness of breath.    Past Medical History  Diagnosis Date  . Asthma   . Bronchitis   . Fibroid    Past Surgical History  Procedure Laterality Date  . Tubal ligation    . Dental surgery    . Bunionectomy      right foot  . Robot assisted myomectomy  03/03/2011    Procedure: ROBOTIC ASSISTED MYOMECTOMY;  Surgeon: Alwyn Pea, MD;  Location: [facility];  Service: Gynecology;  Laterality: N/A;   Family History  Problem Relation Age of Onset  . Diabetes Mother   . Hypertension Mother   . Thyroid disease Father   . Heart disease Father   . Cancer Maternal Grandmother    Social History  Substance Use Topics  . Smoking status: Never Smoker   . Smokeless tobacco: Never Used  . Alcohol Use: No   OB History    Gravida Para Term Preterm AB TAB SAB Ectopic Multiple Living   3 2 2  1  1   2      Review of Systems  Constitutional: Negative.   HENT: Positive for congestion and postnasal drip.   Respiratory: Positive for cough, shortness of breath and wheezing.   Cardiovascular: Negative.   Gastrointestinal: Negative.   All other systems reviewed and are negative.   Allergies  Review of patient's allergies indicates no known allergies.  Home Medications   Prior to Admission medications   Medication Sig Start Date End Date Taking? Authorizing Provider  albuterol (PROVENTIL HFA;VENTOLIN HFA) 108 (90 Base) MCG/ACT inhaler Inhale 2 puffs into the lungs every 6 (six)  hours as needed for wheezing or shortness of breath. 05/19/15   Billy Fischer, MD  cetirizine (ZYRTEC) 10 MG tablet Take 10 mg by mouth daily.    Historical Provider, MD  fluticasone (FLONASE) 50 MCG/ACT nasal spray Place 1 spray into both nostrils 2 (two) times daily. 05/19/15   Billy Fischer, MD  ibuprofen (ADVIL,MOTRIN) 800 MG tablet Take 800 mg by mouth every 6 (six) hours as needed (For pain.).    Historical Provider, MD  methylPREDNISolone (MEDROL DOSEPAK) 4 MG TBPK tablet follow package directions, take until finished, start on tues. 05/19/15   Billy Fischer, MD  metroNIDAZOLE (FLAGYL) 500 MG tablet Take 1 tablet (500 mg total) by mouth 2 (two) times daily. Patient not taking: Reported on 03/25/2014 11/16/13   Collene Leyden Teague Clark, PA-C  montelukast (SINGULAIR) 10 MG tablet Take 10 mg by mouth at bedtime.    Historical Provider, MD  naproxen (NAPROSYN) 500 MG tablet Take 1 tablet (500 mg total) by mouth 2 (two) times daily. 02/26/15   Veryl Speak, MD  terconazole (TERAZOL 7) 0.4 % vaginal cream Place 1 applicator vaginally at bedtime. Use for 7 days. Patient not taking: Reported on 03/25/2014 11/17/13   Virginia Rochester, NP  traMADol (ULTRAM) 50 MG tablet Take 1 tablet (50 mg total) by mouth every 6 (six) hours as needed. 02/26/15   Veryl Speak, MD  Meds Ordered and Administered this Visit   Medications  albuterol (PROVENTIL) (2.5 MG/3ML) 0.083% nebulizer solution 5 mg (5 mg Nebulization Given 05/19/15 1736)  ipratropium (ATROVENT) nebulizer solution 0.5 mg (0.5 mg Nebulization Given 05/19/15 1737)  methylPREDNISolone sodium succinate (SOLU-MEDROL) 125 mg/2 mL injection 125 mg (125 mg Intramuscular Given 05/19/15 1738)    BP 120/82 mmHg  Pulse 99  Temp(Src) 98.2 F (36.8 C) (Oral)  Resp 18  SpO2 99%  LMP 04/16/2015 (Exact Date) No data found.   Physical Exam  Constitutional: She is oriented to person, place, and time. She appears well-developed and well-nourished. She appears  distressed.  HENT:  Right Ear: External ear normal.  Left Ear: External ear normal.  Mouth/Throat: Oropharynx is clear and moist.  Eyes: Pupils are equal, round, and reactive to light.  Neck: Normal range of motion. Neck supple.  Cardiovascular: Normal rate, regular rhythm, normal heart sounds and intact distal pulses.   Pulmonary/Chest: Effort normal. No respiratory distress. She has wheezes. She has no rales. She exhibits no tenderness.  Lymphadenopathy:    She has no cervical adenopathy.  Neurological: She is alert and oriented to person, place, and time.  Skin: Skin is warm and dry.  Nursing note and vitals reviewed.   ED Course  Procedures (including critical care time)  Labs Review Labs Reviewed - No data to display  Imaging Review No results found.   Visual Acuity Review  Right Eye Distance:   Left Eye Distance:   Bilateral Distance:    Right Eye Near:   Left Eye Near:    Bilateral Near:         MDM   1. Asthma due to seasonal allergies    Sx improved at d/c after neb and solumedrol.    Billy Fischer, MD 05/19/15 [phone]

## 2015-05-19 NOTE — ED Notes (Addendum)
Patient presents with wheezing and coughing w/clear production x2 days, pt has asthma and patiently currently not taking any medication to treat her asthma. No acute distress

## 2015-05-26 DIAGNOSIS — J4520 Mild intermittent asthma, uncomplicated: Secondary | ICD-10-CM | POA: Diagnosis not present

## 2015-05-26 DIAGNOSIS — J309 Allergic rhinitis, unspecified: Secondary | ICD-10-CM | POA: Diagnosis not present

## 2015-05-26 DIAGNOSIS — L851 Acquired keratosis [keratoderma] palmaris et plantaris: Secondary | ICD-10-CM | POA: Diagnosis not present

## 2015-05-26 DIAGNOSIS — M205X1 Other deformities of toe(s) (acquired), right foot: Secondary | ICD-10-CM | POA: Diagnosis not present

## 2015-05-26 DIAGNOSIS — J069 Acute upper respiratory infection, unspecified: Secondary | ICD-10-CM | POA: Diagnosis not present

## 2015-06-09 DIAGNOSIS — M2021 Hallux rigidus, right foot: Secondary | ICD-10-CM | POA: Diagnosis not present

## 2015-06-09 DIAGNOSIS — M65871 Other synovitis and tenosynovitis, right ankle and foot: Secondary | ICD-10-CM | POA: Diagnosis not present

## 2015-06-09 DIAGNOSIS — M25571 Pain in right ankle and joints of right foot: Secondary | ICD-10-CM | POA: Diagnosis not present

## 2015-06-20 DIAGNOSIS — M25552 Pain in left hip: Secondary | ICD-10-CM | POA: Diagnosis not present

## 2015-06-28 ENCOUNTER — Encounter (HOSPITAL_COMMUNITY): Payer: Self-pay | Admitting: Emergency Medicine

## 2015-06-28 ENCOUNTER — Ambulatory Visit (HOSPITAL_COMMUNITY)
Admission: EM | Admit: 2015-06-28 | Discharge: 2015-06-28 | Disposition: A | Discharge status: Home / Self Care | Payer: Medicare Other | Attending: Emergency Medicine | Admitting: Emergency Medicine

## 2015-06-28 DIAGNOSIS — J45901 Unspecified asthma with (acute) exacerbation: Secondary | ICD-10-CM | POA: Diagnosis not present

## 2015-06-28 MED ORDER — ALBUTEROL SULFATE HFA 108 (90 BASE) MCG/ACT IN AERS: 1.0000 | INHALATION_SPRAY | Freq: Four times a day (QID) | RESPIRATORY_TRACT | Status: DC | PRN

## 2015-06-28 MED ORDER — ALBUTEROL SULFATE (2.5 MG/3ML) 0.083% IN NEBU
2.5000 mg | INHALATION_SOLUTION | Freq: Once | RESPIRATORY_TRACT | Status: AC
  Administered 2015-06-28: 2.5 mg via RESPIRATORY_TRACT

## 2015-06-28 MED ORDER — ALBUTEROL SULFATE (2.5 MG/3ML) 0.083% IN NEBU
INHALATION_SOLUTION | RESPIRATORY_TRACT | Status: AC
  Filled 2015-06-28: qty 3

## 2015-06-28 MED ORDER — PREDNISONE 10 MG PO TABS: 20.0000 mg | ORAL_TABLET | Freq: Every day | ORAL | Status: DC

## 2015-06-28 NOTE — ED Notes (Signed)
Pt c/o asthma flare up onset x1 week associated w/SOB, wheezing, and a dry cough Ran out of her rescue inhaler x1 week ago A&O x4... No acute distress.

## 2015-06-28 NOTE — Discharge Instructions (Signed)
Asthma, Adult Asthma is a condition of the lungs in which the airways tighten and narrow. Asthma can make it hard to breathe. Asthma cannot be cured, but medicine and lifestyle changes can help control it. Asthma may be started (triggered) by:  Animal skin flakes (dander).  Dust.  Cockroaches.  Pollen.  Mold.  Smoke.  Cleaning products.  Hair sprays or aerosol sprays.  Paint fumes or strong smells.  Cold air, weather changes, and winds.  Crying or laughing hard.  Stress.  Certain medicines or drugs.  Foods, such as dried fruit, potato chips, and sparkling grape juice.  Infections or conditions (colds, flu).  Exercise.  Certain medical conditions or diseases.  Exercise or tiring activities. HOME CARE   Take medicine as told by your doctor.  Use a peak flow meter as told by your doctor. A peak flow meter is a tool that measures how well the lungs are working.  Record and keep track of the peak flow meter's readings.  Understand and use the asthma action plan. An asthma action plan is a written plan for taking care of your asthma and treating your attacks.  To help prevent asthma attacks:  Do not smoke. Stay away from secondhand smoke.  Change your heating and air conditioning filter often.  Limit your use of fireplaces and wood stoves.  Get rid of pests (such as roaches and mice) and their droppings.  Throw away plants if you see mold on them.  Clean your floors. Dust regularly. Use cleaning products that do not smell.  Have someone vacuum when you are not home. Use a vacuum cleaner with a HEPA filter if possible.  Replace carpet with wood, tile, or vinyl flooring. Carpet can trap animal skin flakes and dust.  Use allergy-proof pillows, mattress covers, and box spring covers.  Wash bed sheets and blankets every week in hot water and dry them in a dryer.  Use blankets that are made of polyester or cotton.  Clean bathrooms and kitchens with bleach.  If possible, have someone repaint the walls in these rooms with mold-resistant paint. Keep out of the rooms that are being cleaned and painted.  Wash hands often. GET HELP IF:  You have make a whistling sound when breaking (wheeze), have shortness of breath, or have a cough even if taking medicine to prevent attacks.  The colored mucus you cough up (sputum) is thicker than usual.  The colored mucus you cough up changes from clear or white to yellow, green, gray, or bloody.  You have problems from the medicine you are taking such as:  A rash.  Itching.  Swelling.  Trouble breathing.  You need reliever medicines more than 2-3 times a week.  Your peak flow measurement is still at 50-79% of your personal best after following the action plan for 1 hour.  You have a fever. GET HELP RIGHT AWAY IF:   You seem to be worse and are not responding to medicine during an asthma attack.  You are short of breath even at rest.  You get short of breath when doing very little activity.  You have trouble eating, drinking, or talking.  You have chest pain.  You have a fast heartbeat.  Your lips or fingernails start to turn blue.  You are light-headed, dizzy, or faint.  Your peak flow is less than 50% of your personal best.   This information is not intended to replace advice given to you by your health care provider. Make sure   you discuss any questions you have with your health care provider.   Document Released: 07/07/2007 Document Revised: 10/09/2014 Document Reviewed: 08/17/2012 Elsevier Interactive Patient Education 2016 [employer]  

## 2015-06-28 NOTE — ED Provider Notes (Signed)
CSN: [id number]     Arrival date & time 06/28/15  1900 History   First MD Initiated Contact with Patient 06/28/15 1944     Chief Complaint  Patient presents with  . Asthma   (Consider location/radiation/quality/duration/timing/severity/associated sxs/prior Treatment) HPI History obtained from patient:  Pt presents with the cc of:  Wheezing  Duration of symptoms: Several days Treatment prior to arrival: Albuterol today  Context: Patient states that she ran out of her albuterol today. She also states that she usually gets prednisone when she has to come to the urgent care. Other symptoms include: Cough Pain score: 0 FAMILY HISTORY: No family history of cancer.    Past Medical History  Diagnosis Date  . Asthma   . Bronchitis   . Fibroid    Past Surgical History  Procedure Laterality Date  . Tubal ligation    . Dental surgery    . Bunionectomy      right foot  . Robot assisted myomectomy  03/03/2011    Procedure: ROBOTIC ASSISTED MYOMECTOMY;  Surgeon: Alwyn Pea, MD;  Location: [facility];  Service: Gynecology;  Laterality: N/A;   Family History  Problem Relation Age of Onset  . Diabetes Mother   . Hypertension Mother   . Thyroid disease Father   . Heart disease Father   . Cancer Maternal Grandmother    Social History  Substance Use Topics  . Smoking status: Never Smoker   . Smokeless tobacco: Never Used  . Alcohol Use: No   OB History    Gravida Para Term Preterm AB TAB SAB Ectopic Multiple Living   3 2 2  1  1   2      Review of Systems  Denies: HEADACHE, NAUSEA, ABDOMINAL PAIN, CHEST PAIN, CONGESTION, DYSURIA, SHORTNESS OF BREATH  Allergies  Review of patient's allergies indicates no known allergies.  Home Medications   Prior to Admission medications   Medication Sig Start Date End Date Taking? Authorizing Provider  albuterol (PROVENTIL HFA;VENTOLIN HFA) 108 (90 Base) MCG/ACT inhaler Inhale 2 puffs into the lungs every 6 (six) hours as needed for wheezing  or shortness of breath. 05/19/15  Yes Billy Fischer, MD  albuterol (PROVENTIL HFA;VENTOLIN HFA) 108 (90 Base) MCG/ACT inhaler Inhale 1-2 puffs into the lungs every 6 (six) hours as needed for wheezing or shortness of breath. 06/28/15   Konrad Felix, PA  cetirizine (ZYRTEC) 10 MG tablet Take 10 mg by mouth daily.    Historical Provider, MD  fluticasone (FLONASE) 50 MCG/ACT nasal spray Place 1 spray into both nostrils 2 (two) times daily. 05/19/15   Billy Fischer, MD  ibuprofen (ADVIL,MOTRIN) 800 MG tablet Take 800 mg by mouth every 6 (six) hours as needed (For pain.).    Historical Provider, MD  methylPREDNISolone (MEDROL DOSEPAK) 4 MG TBPK tablet follow package directions, take until finished, start on tues. 05/19/15   Billy Fischer, MD  metroNIDAZOLE (FLAGYL) 500 MG tablet Take 1 tablet (500 mg total) by mouth 2 (two) times daily. Patient not taking: Reported on 03/25/2014 11/16/13   Collene Leyden Teague Clark, PA-C  montelukast (SINGULAIR) 10 MG tablet Take 10 mg by mouth at bedtime.    Historical Provider, MD  naproxen (NAPROSYN) 500 MG tablet Take 1 tablet (500 mg total) by mouth 2 (two) times daily. 02/26/15   Veryl Speak, MD  predniSONE (DELTASONE) 10 MG tablet Take 2 tablets (20 mg total) by mouth daily. 06/28/15   Konrad Felix, PA  terconazole [place]  7) 0.4 % vaginal cream Place 1 applicator vaginally at bedtime. Use for 7 days. Patient not taking: Reported on 03/25/2014 11/17/13   Virginia Rochester, NP  traMADol (ULTRAM) 50 MG tablet Take 1 tablet (50 mg total) by mouth every 6 (six) hours as needed. 02/26/15   Veryl Speak, MD   Meds Ordered and Administered this Visit   Medications  albuterol (PROVENTIL) (2.5 MG/3ML) 0.083% nebulizer solution 2.5 mg (2.5 mg Nebulization Given 06/28/15 2001)   Patient states that she feels much better after nebulizer treatment. BP 111/77 mmHg  Pulse 95  Temp(Src) 98.3 F (36.8 C) (Oral)  Resp 18  SpO2 99%  LMP 06/11/2015 No data found.   Physical  Exam NURSES NOTES AND VITAL SIGNS REVIEWED. CONSTITUTIONAL: Well developed, well nourished, no acute distress HEENT: normocephalic, atraumatic EYES: Conjunctiva normal NECK:normal ROM, supple, no adenopathy PULMONARY:No respiratory distress, normal effort, there are a few diffuse wheezes. ABDOMINAL: Soft, ND, NT BS+, No CVAT MUSCULOSKELETAL: Normal ROM of all extremities,  SKIN: warm and dry without rash PSYCHIATRIC: Mood and affect, behavior are normal  ED Course  Procedures (including critical care time)  Labs Review Labs Reviewed - No data to display  Imaging Review No results found.   Visual Acuity Review  Right Eye Distance:   Left Eye Distance:   Bilateral Distance:    Right Eye Near:   Left Eye Near:    Bilateral Near:     Prescription for albuterol and prednisone sent to patient's pharmacy.    MDM   1. Asthma exacerbation     Patient is reassured that there are no issues that require transfer to higher level of care at this time or additional tests. Patient is advised to continue home symptomatic treatment. Patient is advised that if there are new or worsening symptoms to attend the emergency department, contact primary care provider, or return to UC. Instructions of care provided discharged home in stable condition.    THIS NOTE WAS GENERATED USING A VOICE RECOGNITION SOFTWARE PROGRAM. ALL REASONABLE EFFORTS  WERE MADE TO PROOFREAD THIS DOCUMENT FOR ACCURACY.  I have verbally reviewed the discharge instructions with the patient. A printed AVS was given to the patient.  All questions were answered prior to discharge.      Konrad Felix, [place] 06/28/15 2037

## 2015-07-29 ENCOUNTER — Emergency Department (HOSPITAL_COMMUNITY)
Admission: EM | Admit: 2015-07-29 | Discharge: 2015-07-29 | Disposition: A | Discharge status: Home / Self Care | Payer: Medicare Other | Attending: Emergency Medicine | Admitting: Emergency Medicine

## 2015-07-29 ENCOUNTER — Encounter (HOSPITAL_COMMUNITY): Payer: Self-pay | Admitting: Emergency Medicine

## 2015-07-29 DIAGNOSIS — J45901 Unspecified asthma with (acute) exacerbation: Secondary | ICD-10-CM | POA: Insufficient documentation

## 2015-07-29 DIAGNOSIS — Z7951 Long term (current) use of inhaled steroids: Secondary | ICD-10-CM | POA: Diagnosis not present

## 2015-07-29 DIAGNOSIS — J45909 Unspecified asthma, uncomplicated: Secondary | ICD-10-CM | POA: Diagnosis present

## 2015-07-29 MED ORDER — PREDNISONE 10 MG PO TABS: ORAL_TABLET | ORAL | Status: DC

## 2015-07-29 MED ORDER — IPRATROPIUM-ALBUTEROL 0.5-2.5 (3) MG/3ML IN SOLN
3.0000 mL | Freq: Once | RESPIRATORY_TRACT | Status: AC
  Administered 2015-07-29: 3 mL via RESPIRATORY_TRACT
  Filled 2015-07-29: qty 3

## 2015-07-29 MED ORDER — PREDNISONE 20 MG PO TABS
60.0000 mg | ORAL_TABLET | Freq: Once | ORAL | Status: AC
  Administered 2015-07-29: 60 mg via ORAL
  Filled 2015-07-29: qty 3

## 2015-07-29 MED ORDER — ALBUTEROL SULFATE HFA 108 (90 BASE) MCG/ACT IN AERS
2.0000 | INHALATION_SPRAY | Freq: Once | RESPIRATORY_TRACT | Status: AC
  Administered 2015-07-29: 2 via RESPIRATORY_TRACT
  Filled 2015-07-29: qty 6.7

## 2015-07-29 NOTE — Discharge Instructions (Signed)
Prednisone as prescribed until all gone. Inhaler 2 puffs every 4 hrs for shortness of breath a wheezing. Follow up with your doctor for better asthma control    Asthma, Adult Asthma is a recurring condition in which the airways tighten and narrow. Asthma can make it difficult to breathe. It can cause coughing, wheezing, and shortness of breath. Asthma episodes, also called asthma attacks, range from minor to life-threatening. Asthma cannot be cured, but medicines and lifestyle changes can help control it. CAUSES Asthma is believed to be caused by inherited (genetic) and environmental factors, but its exact cause is unknown. Asthma may be triggered by allergens, lung infections, or irritants in the air. Asthma triggers are different for each person. Common triggers include:   Animal dander.  Dust mites.  Cockroaches.  Pollen from trees or grass.  Mold.  Smoke.  Air pollutants such as dust, household cleaners, hair sprays, aerosol sprays, paint fumes, strong chemicals, or strong odors.  Cold air, weather changes, and winds (which increase molds and pollens in the air).  Strong emotional expressions such as crying or laughing hard.  Stress.  Certain medicines (such as aspirin) or types of drugs (such as beta-blockers).  Sulfites in foods and drinks. Foods and drinks that may contain sulfites include dried fruit, potato chips, and sparkling grape juice.  Infections or inflammatory conditions such as the flu, a cold, or an inflammation of the nasal membranes (rhinitis).  Gastroesophageal reflux disease (GERD).  Exercise or strenuous activity. SYMPTOMS Symptoms may occur immediately after asthma is triggered or many hours later. Symptoms include:  Wheezing.  Excessive nighttime or early morning coughing.  Frequent or severe coughing with a common cold.  Chest tightness.  Shortness of breath. DIAGNOSIS  The diagnosis of asthma is made by a review of your medical history and  a physical exam. Tests may also be performed. These may include:  Lung function studies. These tests show how much air you breathe in and out.  Allergy tests.  Imaging tests such as X-rays. TREATMENT  Asthma cannot be cured, but it can usually be controlled. Treatment involves identifying and avoiding your asthma triggers. It also involves medicines. There are 2 classes of medicine used for asthma treatment:   Controller medicines. These prevent asthma symptoms from occurring. They are usually taken every day.  Reliever or rescue medicines. These quickly relieve asthma symptoms. They are used as needed and provide short-term relief. Your health care provider will help you create an asthma action plan. An asthma action plan is a written plan for managing and treating your asthma attacks. It includes a list of your asthma triggers and how they may be avoided. It also includes information on when medicines should be taken and when their dosage should be changed. An action plan may also involve the use of a device called a peak flow meter. A peak flow meter measures how well the lungs are working. It helps you monitor your condition. HOME CARE INSTRUCTIONS   Take medicines only as directed by your health care provider. Speak with your health care provider if you have questions about how or when to take the medicines.  Use a peak flow meter as directed by your health care provider. Record and keep track of readings.  Understand and use the action plan to help minimize or stop an asthma attack without needing to seek medical care.  Control your home environment in the following ways to help prevent asthma attacks:  Do not smoke. Avoid being  exposed to secondhand smoke.  Change your heating and air conditioning filter regularly.  Limit your use of fireplaces and wood stoves.  Get rid of pests (such as roaches and mice) and their droppings.  Throw away plants if you see mold on them.  Clean  your floors and dust regularly. Use unscented cleaning products.  Try to have someone else vacuum for you regularly. Stay out of rooms while they are being vacuumed and for a short while afterward. If you vacuum, use a dust mask from a hardware store, a double-layered or microfilter vacuum cleaner bag, or a vacuum cleaner with a HEPA filter.  Replace carpet with wood, tile, or vinyl flooring. Carpet can trap dander and dust.  Use allergy-proof pillows, mattress covers, and box spring covers.  Wash bed sheets and blankets every week in hot water and dry them in a dryer.  Use blankets that are made of polyester or cotton.  Clean bathrooms and kitchens with bleach. If possible, have someone repaint the walls in these rooms with mold-resistant paint. Keep out of the rooms that are being cleaned and painted.  Wash hands frequently. SEEK MEDICAL CARE IF:   You have wheezing, shortness of breath, or a cough even if taking medicine to prevent attacks.  The colored mucus you cough up (sputum) is thicker than usual.  Your sputum changes from clear or white to yellow, green, gray, or bloody.  You have any problems that may be related to the medicines you are taking (such as a rash, itching, swelling, or trouble breathing).  You are using a reliever medicine more than 2-3 times per week.  Your peak flow is still at 50-79% of your personal best after following your action plan for 1 hour.  You have a fever. SEEK IMMEDIATE MEDICAL CARE IF:   You seem to be getting worse and are unresponsive to treatment during an asthma attack.  You are short of breath even at rest.  You get short of breath when doing very little physical activity.  You have difficulty eating, drinking, or talking due to asthma symptoms.  You develop chest pain.  You develop a fast heartbeat.  You have a bluish color to your lips or fingernails.  You are light-headed, dizzy, or faint.  Your peak flow is less than  50% of your personal best.   This information is not intended to replace advice given to you by your health care provider. Make sure you discuss any questions you have with your health care provider.   Document Released: 01/18/2005 Document Revised: 10/09/2014 Document Reviewed: 08/17/2012 Elsevier Interactive Patient Education [employer].

## 2015-07-29 NOTE — ED Provider Notes (Signed)
CSN: [id number]     Arrival date & time 07/29/15  1556 History  By signing my name below, I, Rayna Sexton, attest that this documentation has been prepared under the direction and in the presence of Mishawn Hemann, PA-C. Electronically Signed: Rayna Sexton, ED Scribe. 07/29/2015. 4:59 PM.   Chief Complaint  Patient presents with  . Asthma   The history is provided by the patient. No language interpreter was used.    HPI Comments: LISLE DINWIDDIE is a 40 y.o. female with a PMHx of asthma who presents to the Emergency Department complaining of worsening, moderate, non productive cough x 1 week. Pt states that she ran out of her rx inhaler last week and since has been experiencing her cough with an associated, mild, wheezing and SOB. Pt confirms her current symptoms are consistent with prior asthma exacerbations. She works in a [employer] noting her symptoms worsen when around smoke as well as at night. Pt denies having taken any medications for her cough. She denies any other associated symptoms at this time. Denies cp. No fever or chills. No swelling in extremities.    Past Medical History  Diagnosis Date  . Asthma   . Bronchitis   . Fibroid    Past Surgical History  Procedure Laterality Date  . Tubal ligation    . Dental surgery    . Bunionectomy      right foot  . Robot assisted myomectomy  03/03/2011    Procedure: ROBOTIC ASSISTED MYOMECTOMY;  Surgeon: Alwyn Pea, MD;  Location: [facility];  Service: Gynecology;  Laterality: N/A;   Family History  Problem Relation Age of Onset  . Diabetes Mother   . Hypertension Mother   . Thyroid disease Father   . Heart disease Father   . Cancer Maternal Grandmother    Social History  Substance Use Topics  . Smoking status: Never Smoker   . Smokeless tobacco: Never Used  . Alcohol Use: No   OB History    Gravida Para Term Preterm AB TAB SAB Ectopic Multiple Living   3 2 2  1  1   2      Review of Systems   Constitutional: Negative for fever and chills.  HENT: Negative for congestion and sore throat.   Respiratory: Positive for cough, shortness of breath and wheezing. Negative for chest tightness.   Cardiovascular: Negative for chest pain, palpitations and leg swelling.  Musculoskeletal: Negative for myalgias and arthralgias.  Neurological: Negative for headaches.  All other systems reviewed and are negative.   Allergies  Review of patient's allergies indicates no known allergies.  Home Medications   Prior to Admission medications   Medication Sig Start Date End Date Taking? Authorizing Provider  albuterol (PROVENTIL HFA;VENTOLIN HFA) 108 (90 Base) MCG/ACT inhaler Inhale 2 puffs into the lungs every 6 (six) hours as needed for wheezing or shortness of breath. 05/19/15   Billy Fischer, MD  albuterol (PROVENTIL HFA;VENTOLIN HFA) 108 (90 Base) MCG/ACT inhaler Inhale 1-2 puffs into the lungs every 6 (six) hours as needed for wheezing or shortness of breath. 06/28/15   Konrad Felix, PA  cetirizine (ZYRTEC) 10 MG tablet Take 10 mg by mouth daily.    Historical Provider, MD  fluticasone (FLONASE) 50 MCG/ACT nasal spray Place 1 spray into both nostrils 2 (two) times daily. 05/19/15   Billy Fischer, MD  ibuprofen (ADVIL,MOTRIN) 800 MG tablet Take 800 mg by mouth every 6 (six) hours as needed (For pain.).  Historical Provider, MD  methylPREDNISolone (MEDROL DOSEPAK) 4 MG TBPK tablet follow package directions, take until finished, start on tues. 05/19/15   Billy Fischer, MD  metroNIDAZOLE (FLAGYL) 500 MG tablet Take 1 tablet (500 mg total) by mouth 2 (two) times daily. Patient not taking: Reported on 03/25/2014 11/16/13   Collene Leyden Teague Clark, PA-C  montelukast (SINGULAIR) 10 MG tablet Take 10 mg by mouth at bedtime.    Historical Provider, MD  naproxen (NAPROSYN) 500 MG tablet Take 1 tablet (500 mg total) by mouth 2 (two) times daily. 02/26/15   Veryl Speak, MD  predniSONE (DELTASONE) 10 MG tablet  Take 2 tablets (20 mg total) by mouth daily. 06/28/15   Konrad Felix, PA  terconazole (TERAZOL 7) 0.4 % vaginal cream Place 1 applicator vaginally at bedtime. Use for 7 days. Patient not taking: Reported on 03/25/2014 11/17/13   Virginia Rochester, NP  traMADol (ULTRAM) 50 MG tablet Take 1 tablet (50 mg total) by mouth every 6 (six) hours as needed. 02/26/15   Veryl Speak, MD   BP 115/73 mmHg  Pulse 109  Temp(Src) 98.6 F (37 C) (Oral)  Resp 18  Ht 5' (1.524 m)  Wt 189 lb (85.73 kg)  BMI 36.91 kg/m2  SpO2 98%  LMP 07/10/2015 (Approximate)    Physical Exam  Constitutional: She is oriented to person, place, and time. She appears well-developed and well-nourished.  HENT:  Head: Normocephalic and atraumatic.  Eyes: EOM are normal.  Neck: Normal range of motion.  Cardiovascular: Normal rate, regular rhythm and normal heart sounds.   Pulmonary/Chest: Effort normal. No respiratory distress. She has wheezes. She has no rales.  End expiratory wheezes bilaterally  Abdominal: Soft.  Musculoskeletal: Normal range of motion.  Neurological: She is alert and oriented to person, place, and time.  Skin: Skin is warm and dry.  Psychiatric: She has a normal mood and affect.  Nursing note and vitals reviewed.   ED Course  Procedures  DIAGNOSTIC STUDIES: Oxygen Saturation is 98% on RA, normal by my interpretation.    COORDINATION OF CARE: 4:58 PM Discussed next steps with pt. Pt verbalized understanding and is agreeable with the plan.   Labs Review Labs Reviewed - No data to display  Imaging Review No results found.   EKG Interpretation None      MDM   Final diagnoses:  Asthma exacerbation    Pt with worsening cough and wheezing. States "i dont have a cold, I just have asthma." Ran out of inhaler 1 week ago. States symptoms worse around kitchen/smoke, pt works at [employer], and at night. States she usually gets prednisone when it gets this bad. Pt is mildly tachycardic, however not  hypoxic or tachypnec. Will order a duoneb and prednisone 60mg . Will reassess.   Feels better after breathing treatment. Patient is requesting steroids for home. I will give her 4 more days, and provided her with an inhaler. She will need to follow-up with family doctor for better control of her asthma. At this time her vitals are normal, she is not hypoxic, she is stable for discharge home with close outpatient follow-up. She is not febrile, she denies productive cough, I do not think she needs any imaging at this time and do not think she has pneumonia.  Filed Vitals:   07/29/15 1601 07/29/15 1734  BP: 115/73 106/62  Pulse: 109 100  Temp: 98.6 F (37 C)   TempSrc: Oral   Resp: 18 16  Height: 5' (1.524 m)  Weight: 85.73 kg   SpO2: 98% 96%    I personally performed the services described in this documentation, which was scribed in my presence. The recorded information has been reviewed and is accurate.   Jeannett Senior, PA-C 07/29/15 2017  Charlesetta Shanks, MD 08/02/15 1056

## 2015-07-29 NOTE — ED Notes (Signed)
Pt states that she has had increased coughing and wheezing after running out of her inhaler last week. No acute distress at this time. O2 92%. Alert and oriented.

## 2015-08-06 DIAGNOSIS — M25551 Pain in right hip: Secondary | ICD-10-CM | POA: Diagnosis not present

## 2015-08-06 DIAGNOSIS — J309 Allergic rhinitis, unspecified: Secondary | ICD-10-CM | POA: Diagnosis not present

## 2015-08-06 DIAGNOSIS — J4520 Mild intermittent asthma, uncomplicated: Secondary | ICD-10-CM | POA: Diagnosis not present

## 2015-08-20 DIAGNOSIS — M25571 Pain in right ankle and joints of right foot: Secondary | ICD-10-CM | POA: Diagnosis not present

## 2015-08-20 DIAGNOSIS — Z79899 Other long term (current) drug therapy: Secondary | ICD-10-CM | POA: Diagnosis not present

## 2015-08-20 DIAGNOSIS — M722 Plantar fascial fibromatosis: Secondary | ICD-10-CM | POA: Diagnosis not present

## 2015-08-30 ENCOUNTER — Ambulatory Visit (INDEPENDENT_AMBULATORY_CARE_PROVIDER_SITE_OTHER): Payer: Medicare Other

## 2015-08-30 ENCOUNTER — Ambulatory Visit (INDEPENDENT_AMBULATORY_CARE_PROVIDER_SITE_OTHER): Payer: Medicare Other | Admitting: Physician Assistant

## 2015-08-30 VITALS — BP 116/74 | HR 104 | Temp 97.7°F | Resp 18 | Ht 60.0 in | Wt 187.0 lb

## 2015-08-30 DIAGNOSIS — R200 Anesthesia of skin: Secondary | ICD-10-CM | POA: Diagnosis not present

## 2015-08-30 DIAGNOSIS — M79642 Pain in left hand: Secondary | ICD-10-CM

## 2015-08-30 DIAGNOSIS — M79641 Pain in right hand: Secondary | ICD-10-CM

## 2015-08-30 DIAGNOSIS — T7840XA Allergy, unspecified, initial encounter: Secondary | ICD-10-CM | POA: Insufficient documentation

## 2015-08-30 DIAGNOSIS — R208 Other disturbances of skin sensation: Secondary | ICD-10-CM | POA: Diagnosis not present

## 2015-08-30 MED ORDER — DICLOFENAC SODIUM 75 MG PO TBEC: 75.0000 mg | DELAYED_RELEASE_TABLET | Freq: Two times a day (BID) | ORAL | 0 refills | Status: DC

## 2015-08-30 NOTE — Progress Notes (Signed)
Julia Shaw  MRN: [id number] DOB: 28-Mar-1975  Subjective:  Pt presents to clinic with bilateral hand numbness and pain that has been intermittent for about a year but it started getting worse this week.  She is not aware of anything that she changed this week.  She has never been evaluated before for this issue.  Her right hand is worse than the left.  She is right handed.  The pain seems to be more in the palm of the hand but is really not able to give me definitive information about her symptoms. She is unsure whether the pain is in the bones or the muscles of the hand.  The pain and numbness is worse when she is holding or grabbing onto things.  She has some pain and numbness that goes into her forearm but no symptoms in her upper arms, shoulders, or neck.  She has done nothing for her discomfort. 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  Poor historian  PCP [place]  Review of Systems  Neurological: Positive for numbness. Negative for weakness.    Patient Active Problem List   Diagnosis Date Noted  . Asthma 08/30/2015  . Allergy 08/30/2015  . Fibroid, uterus 03/03/2011    Current Outpatient Prescriptions on File Prior to Visit  Medication Sig Dispense Refill  . albuterol (PROVENTIL HFA;VENTOLIN HFA) 108 (90 Base) MCG/ACT inhaler Inhale 1-2 puffs into the lungs every 6 (six) hours as needed for wheezing or shortness of breath. 1 Inhaler 0  . montelukast (SINGULAIR) 10 MG tablet Take 10 mg by mouth at bedtime.     No current facility-administered medications on file prior to visit.     No Known Allergies  Objective:  BP 116/74 (BP Location: Right Arm, Patient Position: Sitting, Cuff Size: Normal)   Pulse (!) 104   Temp 97.7 F (36.5 C) (Oral)   Resp 18   Ht 5' (1.524 m)   Wt 187 lb (84.8  kg)   LMP 08/06/2015   SpO2 98%   BMI 36.52 kg/m   Physical Exam  Constitutional: She is oriented to person, place, and time and well-developed, well-nourished, and in no distress.  HENT:  Head: Normocephalic and atraumatic.  Right Ear: Hearing and external ear normal.  Left Ear: Hearing and external ear normal.  Eyes: Conjunctivae are normal.  Neck: Normal range of motion.  Pulmonary/Chest: Effort normal.  Musculoskeletal:       Cervical back: She exhibits normal range of motion, no tenderness, no bony tenderness and no spasm.       Right hand: She exhibits normal range of motion.       Left hand: She exhibits normal range of motion.  Right < left hand grip strength   Sensation is less on the left compared with right with monofilament testing Dry skin on hand  Neurological: She is alert and oriented to person, place, and time. Gait normal.  Skin: Skin is warm and dry.  Psychiatric: Mood, memory, affect and judgment normal.  Vitals reviewed.  Dg Cervical Spine 2 Or 3 Views  Result Date: 08/30/2015 CLINICAL DATA:  Bilateral hand pain and numbness, left greater than right. No known injury. EXAM: CERVICAL SPINE - 2-3 VIEW COMPARISON:  None. FINDINGS: C1 to the superior endplate of C7  is imaged on the provided lateral radiograph. There is obscuration of the cervical thoracic junction, even on the provided swimmer's radiograph, secondary to overlying osseous and soft tissue structures. Normal alignment of the cervical spine. No anterolisthesis or retrolisthesis. An open mouth odontoid radiograph was not provided and the atlantodental articulation is suboptimally evaluated on the provided Fuchs radiograph. Cervical vertebral body heights appear preserved. Prevertebral soft tissues appear normal. Cervical intervertebral disc space heights appear preserved. Regional soft tissues appear normal. Limited visualization of the lung apices is normal. IMPRESSION: Limited though unremarkable cervical  spine radiographs. Electronically Signed   By: Sandi Mariscal M.D.   On: 08/30/2015 13:41 (pt had a hard time understanding the instructions for the xray - she has had no trauma to her neck)  Assessment and Plan :  Bilateral hand pain - Plan: DG Cervical Spine 2 or 3 views, diclofenac (VOLTAREN) 75 MG EC tablet  Bilateral hand numbness - Plan: DG Cervical Spine 2 or 3 views   Pt's neck is normal per the radiographs and she has a job where she uses her hands - we will start with a wrist splint on her left hand since that is the hand that has the most pain and she is having the most symptoms - we will start NSAIDs to help with the pain and the internal swelling - if this help we can do something similar to the right wrist - if she is having no relief lab work would be appropriate due to the time frame of the symptoms.  She agrees and understands the plan.  Windell Hummingbird PA-C  [facility] 08/30/2015 1:53 PM

## 2015-08-30 NOTE — Patient Instructions (Addendum)
  We will place your left wrist in a brace because that is the worse hand - take the medication that will help with the pain and the swelling inside the wrist - if this helps we can do the same thing with the other wrist - if it does not help please f/u with your PCP for further evaluation   IF you received an x-ray today, you will receive an invoice from [facility]. Please contact [facility] at [phone] with questions or concerns regarding your invoice.   IF you received labwork today, you will receive an invoice from [facility]. Please contact [facility] at [phone] with questions or concerns regarding your invoice.   Our billing staff will not be able to assist you with questions regarding bills from these companies.  You will be contacted with the lab results as soon as they are available. The fastest way to get your results is to activate your My Chart account. Instructions are located on the last page of this paperwork. If you have not heard from [place] regarding the results in 2 weeks, please contact this office.

## 2015-09-09 DIAGNOSIS — M722 Plantar fascial fibromatosis: Secondary | ICD-10-CM | POA: Diagnosis not present

## 2015-09-09 DIAGNOSIS — M79671 Pain in right foot: Secondary | ICD-10-CM | POA: Diagnosis not present

## 2015-09-09 DIAGNOSIS — M25571 Pain in right ankle and joints of right foot: Secondary | ICD-10-CM | POA: Diagnosis not present

## 2015-09-09 DIAGNOSIS — M6701 Short Achilles tendon (acquired), right ankle: Secondary | ICD-10-CM | POA: Diagnosis not present

## 2015-09-12 DIAGNOSIS — M25571 Pain in right ankle and joints of right foot: Secondary | ICD-10-CM | POA: Diagnosis not present

## 2015-09-29 DIAGNOSIS — M25571 Pain in right ankle and joints of right foot: Secondary | ICD-10-CM | POA: Diagnosis not present

## 2015-09-29 DIAGNOSIS — T8189XA Other complications of procedures, not elsewhere classified, initial encounter: Secondary | ICD-10-CM | POA: Diagnosis not present

## 2015-10-27 DIAGNOSIS — M12271 Villonodular synovitis (pigmented), right ankle and foot: Secondary | ICD-10-CM | POA: Diagnosis not present

## 2015-12-04 DIAGNOSIS — M12271 Villonodular synovitis (pigmented), right ankle and foot: Secondary | ICD-10-CM | POA: Diagnosis not present

## 2016-03-04 DIAGNOSIS — M71571 Other bursitis, not elsewhere classified, right ankle and foot: Secondary | ICD-10-CM | POA: Diagnosis not present

## 2016-03-04 DIAGNOSIS — M2011 Hallux valgus (acquired), right foot: Secondary | ICD-10-CM | POA: Diagnosis not present

## 2016-03-04 DIAGNOSIS — M722 Plantar fascial fibromatosis: Secondary | ICD-10-CM | POA: Diagnosis not present

## 2016-03-25 DIAGNOSIS — M71571 Other bursitis, not elsewhere classified, right ankle and foot: Secondary | ICD-10-CM | POA: Diagnosis not present

## 2016-03-25 DIAGNOSIS — L03032 Cellulitis of left toe: Secondary | ICD-10-CM | POA: Diagnosis not present

## 2016-03-25 DIAGNOSIS — M722 Plantar fascial fibromatosis: Secondary | ICD-10-CM | POA: Diagnosis not present

## 2016-04-08 DIAGNOSIS — M71571 Other bursitis, not elsewhere classified, right ankle and foot: Secondary | ICD-10-CM | POA: Diagnosis not present

## 2016-04-08 DIAGNOSIS — M722 Plantar fascial fibromatosis: Secondary | ICD-10-CM | POA: Diagnosis not present

## 2016-04-08 DIAGNOSIS — L600 Ingrowing nail: Secondary | ICD-10-CM | POA: Diagnosis not present

## 2016-04-08 DIAGNOSIS — M7661 Achilles tendinitis, right leg: Secondary | ICD-10-CM | POA: Diagnosis not present

## 2016-04-15 DIAGNOSIS — M79671 Pain in right foot: Secondary | ICD-10-CM | POA: Diagnosis not present

## 2016-04-15 DIAGNOSIS — M79672 Pain in left foot: Secondary | ICD-10-CM | POA: Diagnosis not present

## 2016-04-15 DIAGNOSIS — B351 Tinea unguium: Secondary | ICD-10-CM | POA: Diagnosis not present

## 2016-05-06 DIAGNOSIS — M722 Plantar fascial fibromatosis: Secondary | ICD-10-CM | POA: Diagnosis not present

## 2016-05-06 DIAGNOSIS — M7661 Achilles tendinitis, right leg: Secondary | ICD-10-CM | POA: Diagnosis not present

## 2016-05-06 DIAGNOSIS — Z01812 Encounter for preprocedural laboratory examination: Secondary | ICD-10-CM | POA: Diagnosis not present

## 2016-05-06 DIAGNOSIS — Z79899 Other long term (current) drug therapy: Secondary | ICD-10-CM | POA: Diagnosis not present

## 2016-05-20 DIAGNOSIS — M79672 Pain in left foot: Secondary | ICD-10-CM | POA: Diagnosis not present

## 2016-05-20 DIAGNOSIS — B351 Tinea unguium: Secondary | ICD-10-CM | POA: Diagnosis not present

## 2016-05-20 DIAGNOSIS — M79671 Pain in right foot: Secondary | ICD-10-CM | POA: Diagnosis not present

## 2016-05-25 DIAGNOSIS — M722 Plantar fascial fibromatosis: Secondary | ICD-10-CM | POA: Diagnosis not present

## 2016-05-25 DIAGNOSIS — M66871 Spontaneous rupture of other tendons, right ankle and foot: Secondary | ICD-10-CM | POA: Diagnosis not present

## 2016-05-25 DIAGNOSIS — M7661 Achilles tendinitis, right leg: Secondary | ICD-10-CM | POA: Diagnosis not present

## 2016-05-31 DIAGNOSIS — M7661 Achilles tendinitis, right leg: Secondary | ICD-10-CM | POA: Diagnosis not present

## 2016-05-31 DIAGNOSIS — M722 Plantar fascial fibromatosis: Secondary | ICD-10-CM | POA: Diagnosis not present

## 2016-06-09 DIAGNOSIS — M7661 Achilles tendinitis, right leg: Secondary | ICD-10-CM | POA: Diagnosis not present

## 2016-06-18 DIAGNOSIS — M79672 Pain in left foot: Secondary | ICD-10-CM | POA: Diagnosis not present

## 2016-06-18 DIAGNOSIS — B351 Tinea unguium: Secondary | ICD-10-CM | POA: Diagnosis not present

## 2016-06-18 DIAGNOSIS — M79671 Pain in right foot: Secondary | ICD-10-CM | POA: Diagnosis not present

## 2016-06-24 DIAGNOSIS — M7661 Achilles tendinitis, right leg: Secondary | ICD-10-CM | POA: Diagnosis not present

## 2016-08-12 DIAGNOSIS — Z13220 Encounter for screening for lipoid disorders: Secondary | ICD-10-CM | POA: Diagnosis not present

## 2016-08-12 DIAGNOSIS — Z0000 Encounter for general adult medical examination without abnormal findings: Secondary | ICD-10-CM | POA: Diagnosis not present

## 2016-08-13 ENCOUNTER — Other Ambulatory Visit: Payer: Self-pay | Admitting: Family Medicine

## 2016-08-13 DIAGNOSIS — Z1231 Encounter for screening mammogram for malignant neoplasm of breast: Secondary | ICD-10-CM

## 2016-08-27 ENCOUNTER — Ambulatory Visit
Admission: RE | Admit: 2016-08-27 | Discharge: 2016-08-27 | Disposition: A | Discharge status: Home / Self Care | Payer: Medicare Other | Source: Ambulatory Visit | Attending: Family Medicine | Admitting: Family Medicine

## 2016-08-27 DIAGNOSIS — Z1231 Encounter for screening mammogram for malignant neoplasm of breast: Secondary | ICD-10-CM | POA: Diagnosis not present

## 2016-08-31 ENCOUNTER — Other Ambulatory Visit: Payer: Self-pay | Admitting: Family Medicine

## 2016-08-31 DIAGNOSIS — R928 Other abnormal and inconclusive findings on diagnostic imaging of breast: Secondary | ICD-10-CM

## 2016-09-07 ENCOUNTER — Other Ambulatory Visit: Payer: Self-pay | Admitting: Family Medicine

## 2016-09-07 ENCOUNTER — Ambulatory Visit
Admission: RE | Admit: 2016-09-07 | Discharge: 2016-09-07 | Disposition: A | Discharge status: Home / Self Care | Payer: Medicare Other | Source: Ambulatory Visit | Attending: Family Medicine | Admitting: Family Medicine

## 2016-09-07 DIAGNOSIS — R928 Other abnormal and inconclusive findings on diagnostic imaging of breast: Secondary | ICD-10-CM

## 2016-09-07 DIAGNOSIS — R921 Mammographic calcification found on diagnostic imaging of breast: Secondary | ICD-10-CM | POA: Diagnosis not present

## 2016-09-07 DIAGNOSIS — N6320 Unspecified lump in the left breast, unspecified quadrant: Secondary | ICD-10-CM

## 2016-09-07 DIAGNOSIS — N6324 Unspecified lump in the left breast, lower inner quadrant: Secondary | ICD-10-CM | POA: Diagnosis not present

## 2016-09-16 ENCOUNTER — Other Ambulatory Visit: Payer: Self-pay | Admitting: Family Medicine

## 2016-09-16 ENCOUNTER — Ambulatory Visit
Admission: RE | Admit: 2016-09-16 | Discharge: 2016-09-16 | Disposition: A | Discharge status: Home / Self Care | Payer: Medicare Other | Source: Ambulatory Visit | Attending: Family Medicine | Admitting: Family Medicine

## 2016-09-16 DIAGNOSIS — R928 Other abnormal and inconclusive findings on diagnostic imaging of breast: Secondary | ICD-10-CM

## 2016-09-16 DIAGNOSIS — M545 Low back pain: Secondary | ICD-10-CM | POA: Diagnosis not present

## 2016-09-16 DIAGNOSIS — J4520 Mild intermittent asthma, uncomplicated: Secondary | ICD-10-CM | POA: Diagnosis not present

## 2016-09-16 DIAGNOSIS — N6022 Fibroadenosis of left breast: Secondary | ICD-10-CM | POA: Diagnosis not present

## 2016-09-16 DIAGNOSIS — N6324 Unspecified lump in the left breast, lower inner quadrant: Secondary | ICD-10-CM | POA: Diagnosis not present

## 2016-09-16 DIAGNOSIS — N6320 Unspecified lump in the left breast, unspecified quadrant: Secondary | ICD-10-CM

## 2016-09-16 DIAGNOSIS — R921 Mammographic calcification found on diagnostic imaging of breast: Secondary | ICD-10-CM

## 2016-09-16 DIAGNOSIS — D242 Benign neoplasm of left breast: Secondary | ICD-10-CM | POA: Diagnosis not present

## 2016-09-16 DIAGNOSIS — M25551 Pain in right hip: Secondary | ICD-10-CM | POA: Diagnosis not present

## 2016-10-11 ENCOUNTER — Ambulatory Visit: Payer: Self-pay | Admitting: General Surgery

## 2016-10-11 ENCOUNTER — Other Ambulatory Visit: Payer: Self-pay | Admitting: General Surgery

## 2016-10-11 DIAGNOSIS — N6022 Fibroadenosis of left breast: Secondary | ICD-10-CM | POA: Diagnosis not present

## 2016-10-27 ENCOUNTER — Other Ambulatory Visit: Payer: Self-pay

## 2016-10-27 ENCOUNTER — Encounter (HOSPITAL_COMMUNITY)
Admission: RE | Admit: 2016-10-27 | Discharge: 2016-10-27 | Disposition: A | Discharge status: Home / Self Care | Payer: Medicare Other | Source: Ambulatory Visit | Attending: General Surgery | Admitting: General Surgery

## 2016-10-27 DIAGNOSIS — Z01818 Encounter for other preprocedural examination: Secondary | ICD-10-CM | POA: Diagnosis not present

## 2016-10-27 DIAGNOSIS — R9431 Abnormal electrocardiogram [ECG] [EKG]: Secondary | ICD-10-CM | POA: Insufficient documentation

## 2016-10-27 DIAGNOSIS — N6022 Fibroadenosis of left breast: Secondary | ICD-10-CM | POA: Diagnosis not present

## 2016-10-27 LAB — BASIC METABOLIC PANEL
ANION GAP: 4 — AB (ref 5–15)
BUN: 5 mg/dL — ABNORMAL LOW (ref 6–20)
CO2: 27 mmol/L (ref 22–32)
Calcium: 9 mg/dL (ref 8.9–10.3)
Chloride: 104 mmol/L (ref 101–111)
Creatinine, Ser: 0.59 mg/dL (ref 0.44–1.00)
GFR calc Af Amer: >60 mL/min (ref >60)
GLUCOSE: 99 mg/dL (ref 65–99)
POTASSIUM: 3.2 mmol/L — AB (ref 3.5–5.1)
Sodium: 135 mmol/L (ref 135–145)

## 2016-10-27 LAB — CBC
HEMATOCRIT: 35.7 % — AB (ref 36.0–46.0)
HEMOGLOBIN: 12.2 g/dL (ref 12.0–15.0)
MCH: 28.2 pg (ref 26.0–34.0)
MCHC: 34.2 g/dL (ref 30.0–36.0)
MCV: 82.6 fL (ref 78.0–100.0)
Platelets: 361 10*3/uL (ref 150–400)
RBC: 4.32 MIL/uL (ref 3.87–5.11)
RDW: 13.7 % (ref 11.5–15.5)
WBC: 8.7 10*3/uL (ref 4.0–10.5)

## 2016-10-27 LAB — HCG, SERUM, QUALITATIVE: Preg, Serum: NEGATIVE

## 2016-10-27 MED ORDER — CHLORHEXIDINE GLUCONATE CLOTH 2 % EX PADS: 6.0000 | MEDICATED_PAD | Freq: Once | CUTANEOUS | Status: DC

## 2016-10-27 NOTE — Progress Notes (Addendum)
BXI:DHWYS, Zadie Cleverly, MD  Cardiologist: pt denies  EKG: pt denies past year  Stress test: pt denies ever  ECHO:pt denies ever  Cardiac Cath:pt denies ever  Chest x-ray: pt denies past year  Paged pharmacy tech twice, went over patient medications with patient.  She was unable to wait on pharmacy tech to call back/arrive. Will have to review day of surgery

## 2016-10-27 NOTE — Pre-Procedure Instructions (Addendum)
Julia Shaw  10/27/2016      [facility] [place] [place] - [address] [place] [address] Phone: [phone] Fax: [phone]    Your procedure is scheduled on November 01, 2016.  Report to [facility] Admitting at 630 AM.  Call this number if you have problems the morning of surgery:  [phone]   Remember:  Do not eat food or drink liquids after midnight October 31, 2016 aside from drinking boost breeze. Please finish boost breeze by 430 AM day of surgery.   Take these medicines the morning of surgery with A SIP OF WATER albuterol inhaler-if needed, dulera inhaler-if needed (bring inhalers with you).  7 days prior to surgery STOP taking any diclofenac (voltaren), Aspirin, Aleve, Naproxen, Ibuprofen, Motrin, Advil, Goody's, BC's, all herbal medications, fish oil, and all vitamins   Do not wear jewelry, make-up or nail polish.  Do not wear lotions, powders, or perfumes, or deoderant.  Do not shave 48 hours prior to surgery.    Do not bring valuables to the hospital.  [facility] is not responsible for any belongings or valuables.  Contacts, dentures or bridgework may not be worn into surgery.  Leave your suitcase in the car.  After surgery it may be brought to your room.  For patients admitted to the hospital, discharge time will be determined by your treatment team.  Patients discharged the day of surgery will not be allowed to drive home.   Special instructions:   - Preparing For Surgery  Before surgery, you can play an important role. Because skin is not sterile, your skin needs to be as free of germs as possible. You can reduce the number of germs on your skin by washing with CHG (chlorahexidine gluconate) Soap before surgery.  CHG is an antiseptic cleaner which kills germs and bonds with the skin to continue killing germs even after washing.  Please do not  use if you have an allergy to CHG or antibacterial soaps. If your skin becomes reddened/irritated stop using the CHG.  Do not shave (including legs and underarms) for at least 48 hours prior to first CHG shower. It is OK to shave your face.  Please follow these instructions carefully.   1. Shower the NIGHT BEFORE SURGERY and the MORNING OF SURGERY with CHG.   2. If you chose to wash your hair, wash your hair first as usual with your normal shampoo.  3. After you shampoo, rinse your hair and body thoroughly to remove the shampoo.  4. Use CHG as you would any other liquid soap. You can apply CHG directly to the skin and wash gently with a scrungie or a clean washcloth.   5. Apply the CHG Soap to your body ONLY FROM THE NECK DOWN.  Do not use on open wounds or open sores. Avoid contact with your eyes, ears, mouth and genitals (private parts). Wash genitals (private parts) with your normal soap.  6. Wash thoroughly, paying special attention to the area where your surgery will be performed.  7. Thoroughly rinse your body with warm water from the neck down.  8. DO NOT shower/wash with your normal soap after using and rinsing off the CHG Soap.  9. Pat yourself dry with a CLEAN TOWEL.   10. Wear CLEAN PAJAMAS   11. Place CLEAN SHEETS on your bed the night of your first shower and  DO NOT SLEEP WITH PETS.    Day of Surgery: Do not apply any deodorants/lotions. Please wear clean clothes to the hospital/surgery center.     Please read over the following fact sheets that you were given. Pain Booklet, Coughing and Deep Breathing and Surgical Site Infection Prevention

## 2016-10-29 ENCOUNTER — Ambulatory Visit
Admission: RE | Admit: 2016-10-29 | Discharge: 2016-10-29 | Disposition: A | Discharge status: Home / Self Care | Payer: Medicare Other | Source: Ambulatory Visit | Attending: General Surgery | Admitting: General Surgery

## 2016-10-29 ENCOUNTER — Inpatient Hospital Stay: Admission: RE | Admit: 2016-10-29 | Payer: Medicare Other | Source: Ambulatory Visit

## 2016-10-29 DIAGNOSIS — D242 Benign neoplasm of left breast: Secondary | ICD-10-CM | POA: Diagnosis not present

## 2016-10-29 DIAGNOSIS — N6022 Fibroadenosis of left breast: Secondary | ICD-10-CM

## 2016-10-31 ENCOUNTER — Encounter (HOSPITAL_COMMUNITY): Payer: Self-pay | Admitting: Anesthesiology

## 2016-10-31 NOTE — Anesthesia Preprocedure Evaluation (Addendum)
Anesthesia Evaluation  Patient identified by MRN, date of birth, ID band Patient awake    Reviewed: Allergy & Precautions, NPO status , Patient's Chart, lab work & pertinent test results  Airway Mallampati: III  TM Distance: >3 FB Neck ROM: Full    Dental no notable dental hx. (+) Teeth Intact, Dental Advisory Given   Pulmonary asthma ,    Pulmonary exam normal breath sounds clear to auscultation       Cardiovascular negative cardio ROS Normal cardiovascular exam Rhythm:Regular Rate:Normal     Neuro/Psych negative neurological ROS  negative psych ROS   GI/Hepatic negative GI ROS, Neg liver ROS,   Endo/Other  Morbid obesityLeft breast papilloma Obesity   Renal/GU negative Renal ROS  negative genitourinary   Musculoskeletal  (+) Arthritis , Osteoarthritis,    Abdominal (+) + obese,   Peds  Hematology  (+) anemia ,   Anesthesia Other Findings Right Exotropia  Reproductive/Obstetrics                          Anesthesia Physical Anesthesia Plan  ASA: II  Anesthesia Plan: General   Post-op Pain Management:    Induction: Intravenous  PONV Risk Score and Plan: 4 or greater and Ondansetron, Dexamethasone, Midazolam, Scopolamine patch - Pre-op and Propofol infusion  Airway Management Planned: LMA  Additional Equipment:   Intra-op Plan:   Post-operative Plan: Extubation in OR  Informed Consent: I have reviewed the patients History and Physical, chart, labs and discussed the procedure including the risks, benefits and alternatives for the proposed anesthesia with the patient or authorized representative who has indicated his/her understanding and acceptance.   Dental advisory given  Plan Discussed with: CRNA, Anesthesiologist and Surgeon  Anesthesia Plan Comments:        Anesthesia Quick Evaluation

## 2016-11-01 ENCOUNTER — Encounter (HOSPITAL_COMMUNITY)
Admission: RE | Disposition: A | Discharge status: Home / Self Care | Payer: Self-pay | Source: Ambulatory Visit | Attending: General Surgery

## 2016-11-01 ENCOUNTER — Ambulatory Visit (HOSPITAL_COMMUNITY)
Admission: RE | Admit: 2016-11-01 | Discharge: 2016-11-01 | Disposition: A | Discharge status: Home / Self Care | Payer: Medicare Other | Source: Ambulatory Visit | Attending: General Surgery | Admitting: General Surgery

## 2016-11-01 ENCOUNTER — Encounter (HOSPITAL_COMMUNITY): Payer: Self-pay

## 2016-11-01 ENCOUNTER — Ambulatory Visit
Admission: RE | Admit: 2016-11-01 | Discharge: 2016-11-01 | Disposition: A | Discharge status: Home / Self Care | Payer: Medicare Other | Source: Ambulatory Visit | Attending: General Surgery | Admitting: General Surgery

## 2016-11-01 ENCOUNTER — Ambulatory Visit (HOSPITAL_COMMUNITY): Payer: Medicare Other | Admitting: Anesthesiology

## 2016-11-01 DIAGNOSIS — N6022 Fibroadenosis of left breast: Secondary | ICD-10-CM | POA: Insufficient documentation

## 2016-11-01 DIAGNOSIS — D242 Benign neoplasm of left breast: Secondary | ICD-10-CM | POA: Diagnosis not present

## 2016-11-01 DIAGNOSIS — H5010 Unspecified exotropia: Secondary | ICD-10-CM | POA: Diagnosis not present

## 2016-11-01 DIAGNOSIS — N6092 Unspecified benign mammary dysplasia of left breast: Secondary | ICD-10-CM | POA: Diagnosis not present

## 2016-11-01 DIAGNOSIS — Z791 Long term (current) use of non-steroidal anti-inflammatories (NSAID): Secondary | ICD-10-CM | POA: Insufficient documentation

## 2016-11-01 DIAGNOSIS — M1990 Unspecified osteoarthritis, unspecified site: Secondary | ICD-10-CM | POA: Insufficient documentation

## 2016-11-01 DIAGNOSIS — Z833 Family history of diabetes mellitus: Secondary | ICD-10-CM | POA: Insufficient documentation

## 2016-11-01 DIAGNOSIS — Z79899 Other long term (current) drug therapy: Secondary | ICD-10-CM | POA: Diagnosis not present

## 2016-11-01 DIAGNOSIS — Z8249 Family history of ischemic heart disease and other diseases of the circulatory system: Secondary | ICD-10-CM | POA: Insufficient documentation

## 2016-11-01 DIAGNOSIS — C50912 Malignant neoplasm of unspecified site of left female breast: Secondary | ICD-10-CM | POA: Diagnosis not present

## 2016-11-01 DIAGNOSIS — N6082 Other benign mammary dysplasias of left breast: Secondary | ICD-10-CM | POA: Diagnosis not present

## 2016-11-01 DIAGNOSIS — J45909 Unspecified asthma, uncomplicated: Secondary | ICD-10-CM | POA: Insufficient documentation

## 2016-11-01 DIAGNOSIS — Z8261 Family history of arthritis: Secondary | ICD-10-CM | POA: Diagnosis not present

## 2016-11-01 DIAGNOSIS — D259 Leiomyoma of uterus, unspecified: Secondary | ICD-10-CM | POA: Diagnosis not present

## 2016-11-01 HISTORY — PX: BREAST LUMPECTOMY WITH RADIOACTIVE SEED LOCALIZATION: SHX6424

## 2016-11-01 HISTORY — DX: Benign neoplasm of unspecified breast: D24.9

## 2016-11-01 HISTORY — DX: Disorder of breast, unspecified: N64.9

## 2016-11-01 SURGERY — BREAST LUMPECTOMY WITH RADIOACTIVE SEED LOCALIZATION
Anesthesia: General | Site: Breast | Laterality: Left

## 2016-11-01 MED ORDER — CELECOXIB 200 MG PO CAPS
400.0000 mg | ORAL_CAPSULE | ORAL | Status: AC
  Administered 2016-11-01: 400 mg via ORAL
  Filled 2016-11-01: qty 2

## 2016-11-01 MED ORDER — HYDROCODONE-ACETAMINOPHEN 7.5-325 MG PO TABS: 1.0000 | ORAL_TABLET | Freq: Once | ORAL | Status: DC | PRN

## 2016-11-01 MED ORDER — MIDAZOLAM HCL 2 MG/2ML IJ SOLN
INTRAMUSCULAR | Status: AC
  Filled 2016-11-01: qty 2

## 2016-11-01 MED ORDER — SCOPOLAMINE 1 MG/3DAYS TD PT72
1.0000 | MEDICATED_PATCH | TRANSDERMAL | Status: DC
  Administered 2016-11-01: 1 via TRANSDERMAL

## 2016-11-01 MED ORDER — LIDOCAINE HCL (CARDIAC) 20 MG/ML IV SOLN
INTRAVENOUS | Status: DC | PRN
  Administered 2016-11-01: 80 mg via INTRAVENOUS

## 2016-11-01 MED ORDER — FENTANYL CITRATE (PF) 100 MCG/2ML IJ SOLN
INTRAMUSCULAR | Status: DC | PRN
  Administered 2016-11-01: 50 ug via INTRAVENOUS
  Administered 2016-11-01: 100 ug via INTRAVENOUS

## 2016-11-01 MED ORDER — ONDANSETRON HCL 4 MG/2ML IJ SOLN
INTRAMUSCULAR | Status: DC | PRN
  Administered 2016-11-01: 4 mg via INTRAVENOUS

## 2016-11-01 MED ORDER — CEFAZOLIN SODIUM-DEXTROSE 2-4 GM/100ML-% IV SOLN
2.0000 g | INTRAVENOUS | Status: AC
  Administered 2016-11-01: 2 g via INTRAVENOUS
  Filled 2016-11-01: qty 100

## 2016-11-01 MED ORDER — GABAPENTIN 300 MG PO CAPS
300.0000 mg | ORAL_CAPSULE | ORAL | Status: AC
  Administered 2016-11-01: 300 mg via ORAL
  Filled 2016-11-01: qty 1

## 2016-11-01 MED ORDER — ONDANSETRON HCL 4 MG/2ML IJ SOLN
INTRAMUSCULAR | Status: AC
  Filled 2016-11-01: qty 2

## 2016-11-01 MED ORDER — FENTANYL CITRATE (PF) 250 MCG/5ML IJ SOLN
INTRAMUSCULAR | Status: AC
  Filled 2016-11-01: qty 5

## 2016-11-01 MED ORDER — BUPIVACAINE-EPINEPHRINE 0.25% -1:200000 IJ SOLN
INTRAMUSCULAR | Status: DC | PRN
Start: 2016-11-01 — End: 2016-11-01
  Administered 2016-11-01: 19 mL

## 2016-11-01 MED ORDER — HYDROCODONE-ACETAMINOPHEN 5-325 MG PO TABS: 1.0000 | ORAL_TABLET | ORAL | 0 refills | Status: DC | PRN

## 2016-11-01 MED ORDER — SCOPOLAMINE 1 MG/3DAYS TD PT72
MEDICATED_PATCH | TRANSDERMAL | Status: AC
  Filled 2016-11-01: qty 1

## 2016-11-01 MED ORDER — LIDOCAINE 2% (20 MG/ML) 5 ML SYRINGE
INTRAMUSCULAR | Status: AC
  Filled 2016-11-01: qty 5

## 2016-11-01 MED ORDER — MIDAZOLAM HCL 5 MG/5ML IJ SOLN
INTRAMUSCULAR | Status: DC | PRN
  Administered 2016-11-01: 2 mg via INTRAVENOUS

## 2016-11-01 MED ORDER — ACETAMINOPHEN 500 MG PO TABS
1000.0000 mg | ORAL_TABLET | ORAL | Status: AC
  Administered 2016-11-01: 1000 mg via ORAL
  Filled 2016-11-01: qty 2

## 2016-11-01 MED ORDER — PROPOFOL 10 MG/ML IV BOLUS
INTRAVENOUS | Status: AC
  Filled 2016-11-01: qty 20

## 2016-11-01 MED ORDER — FENTANYL CITRATE (PF) 100 MCG/2ML IJ SOLN
INTRAMUSCULAR | Status: AC
  Filled 2016-11-01: qty 2

## 2016-11-01 MED ORDER — LACTATED RINGERS IV SOLN
INTRAVENOUS | Status: DC
  Administered 2016-11-01: 08:00:00 via INTRAVENOUS

## 2016-11-01 MED ORDER — 0.9 % SODIUM CHLORIDE (POUR BTL) OPTIME
TOPICAL | Status: DC | PRN
  Administered 2016-11-01: 1000 mL

## 2016-11-01 MED ORDER — MEPERIDINE HCL 25 MG/ML IJ SOLN: 6.2500 mg | INTRAMUSCULAR | Status: DC | PRN

## 2016-11-01 MED ORDER — DEXAMETHASONE SODIUM PHOSPHATE 10 MG/ML IJ SOLN
INTRAMUSCULAR | Status: DC | PRN
  Administered 2016-11-01: 10 mg via INTRAVENOUS

## 2016-11-01 MED ORDER — METOCLOPRAMIDE HCL 5 MG/ML IJ SOLN: 10.0000 mg | Freq: Once | INTRAMUSCULAR | Status: DC | PRN

## 2016-11-01 MED ORDER — BUPIVACAINE-EPINEPHRINE (PF) 0.25% -1:200000 IJ SOLN
INTRAMUSCULAR | Status: AC
  Filled 2016-11-01: qty 30

## 2016-11-01 MED ORDER — DEXAMETHASONE SODIUM PHOSPHATE 10 MG/ML IJ SOLN
INTRAMUSCULAR | Status: AC
  Filled 2016-11-01: qty 1

## 2016-11-01 MED ORDER — FENTANYL CITRATE (PF) 100 MCG/2ML IJ SOLN: 25.0000 ug | INTRAMUSCULAR | Status: DC | PRN

## 2016-11-01 MED ORDER — PROPOFOL 10 MG/ML IV BOLUS
INTRAVENOUS | Status: DC | PRN
  Administered 2016-11-01: 160 mg via INTRAVENOUS

## 2016-11-01 SURGICAL SUPPLY — 37 items
ADH SKN CLS APL DERMABOND .7 (GAUZE/BANDAGES/DRESSINGS) ×1
APPLIER CLIP 9.375 MED OPEN (MISCELLANEOUS) ×3
APR CLP MED 9.3 20 MLT OPN (MISCELLANEOUS) ×1
BLADE SURG 15 STRL LF DISP TIS (BLADE) ×1 IMPLANT
BLADE SURG 15 STRL SS (BLADE) ×3
CANISTER SUCT 3000ML PPV (MISCELLANEOUS) ×3 IMPLANT
CHLORAPREP W/TINT 26ML (MISCELLANEOUS) ×3 IMPLANT
CLIP APPLIE 9.375 MED OPEN (MISCELLANEOUS) IMPLANT
COVER PROBE W GEL 5X96 (DRAPES) ×3 IMPLANT
COVER SURGICAL LIGHT HANDLE (MISCELLANEOUS) ×3 IMPLANT
DERMABOND ADVANCED (GAUZE/BANDAGES/DRESSINGS) ×2
DERMABOND ADVANCED .7 DNX12 (GAUZE/BANDAGES/DRESSINGS) ×1 IMPLANT
DEVICE DUBIN SPECIMEN MAMMOGRA (MISCELLANEOUS) ×3 IMPLANT
DRAPE CHEST BREAST 15X10 FENES (DRAPES) ×3 IMPLANT
DRAPE UTILITY XL STRL (DRAPES) ×3 IMPLANT
ELECT COATED BLADE 2.86 ST (ELECTRODE) ×3 IMPLANT
ELECT REM PT RETURN 9FT ADLT (ELECTROSURGICAL) ×3
ELECTRODE REM PT RTRN 9FT ADLT (ELECTROSURGICAL) ×1 IMPLANT
GLOVE BIO SURGEON STRL SZ7.5 (GLOVE) ×6 IMPLANT
GOWN STRL REUS W/ TWL LRG LVL3 (GOWN DISPOSABLE) ×2 IMPLANT
GOWN STRL REUS W/TWL LRG LVL3 (GOWN DISPOSABLE) ×6
KIT BASIN OR (CUSTOM PROCEDURE TRAY) ×3 IMPLANT
KIT MARKER MARGIN INK (KITS) ×3 IMPLANT
NDL HYPO 25GX1X1/2 BEV (NEEDLE) ×1 IMPLANT
NEEDLE HYPO 25GX1X1/2 BEV (NEEDLE) ×3 IMPLANT
NS IRRIG 1000ML POUR BTL (IV SOLUTION) ×3 IMPLANT
PACK SURGICAL SETUP 50X90 (CUSTOM PROCEDURE TRAY) ×3 IMPLANT
PENCIL BUTTON HOLSTER BLD 10FT (ELECTRODE) ×3 IMPLANT
SPONGE LAP 18X18 X RAY DECT (DISPOSABLE) ×3 IMPLANT
SUT MNCRL AB 4-0 PS2 18 (SUTURE) ×3 IMPLANT
SUT VIC AB 3-0 SH 18 (SUTURE) ×3 IMPLANT
SYR BULB 3OZ (MISCELLANEOUS) ×3 IMPLANT
SYR CONTROL 10ML LL (SYRINGE) ×3 IMPLANT
TOWEL OR 17X24 6PK STRL BLUE (TOWEL DISPOSABLE) ×3 IMPLANT
TUBE CONNECTING 12'X1/4 (SUCTIONS) ×1
TUBE CONNECTING 12X1/4 (SUCTIONS) ×2 IMPLANT
YANKAUER SUCT BULB TIP NO VENT (SUCTIONS) ×3 IMPLANT

## 2016-11-01 NOTE — Transfer of Care (Signed)
Immediate Anesthesia Transfer of Care Note  Patient: Julia Shaw  Procedure(s) Performed: LEFT BREAST RADIOACTIVE SEED GUIDED LUMPECTOMY (Left Breast)  Patient Location: PACU  Anesthesia Type:General  Level of Consciousness: oriented, drowsy and patient cooperative  Airway & Oxygen Therapy: Patient Spontanous Breathing and Patient connected to face mask oxygen  Post-op Assessment: Report given to RN and Post -op Vital signs reviewed and stable  Post vital signs: Reviewed  Last Vitals:  Vitals:   11/01/16 0744  BP: (!) 141/69  Pulse: 98  Resp: 20  Temp: 36.7 C  SpO2: 100%    Last Pain: There were no vitals filed for this visit.    Patients Stated Pain Goal: 4 (81/84/03 7543)  Complications: No apparent anesthesia complications

## 2016-11-01 NOTE — Op Note (Signed)
11/01/2016  9:33 AM  PATIENT:  Julia Shaw  41 y.o. female  PRE-OPERATIVE DIAGNOSIS:  LEFT BREAST PAPILLOMA  POST-OPERATIVE DIAGNOSIS:  LEFT BREAST PAPILLOMA  PROCEDURE:  Procedure(s): LEFT BREAST RADIOACTIVE SEED GUIDED LUMPECTOMY (Left)  SURGEON:  Surgeon(s) and Role:    Jovita Kussmaul, MD - Primary  PHYSICIAN ASSISTANT:   ASSISTANTS: none   ANESTHESIA:   local and general  EBL:  Total I/O In: 900 [I.V.:900] Out: -   BLOOD ADMINISTERED:none  DRAINS: none   LOCAL MEDICATIONS USED:  MARCAINE     SPECIMEN:  Source of Specimen:  left breast tissue  DISPOSITION OF SPECIMEN:  PATHOLOGY  COUNTS:  YES  TOURNIQUET:  * No tourniquets in log *  DICTATION: .Dragon Dictation   After informed consent was obtained the patient was brought to the operating room and placed in the supine position on the operating table. After adequate induction of general anesthesia the patient's left breast was prepped with ChloraPrep, allowed to dry, draped in usual sterile manner. An appropriate timeout was performed. Previously an I-125 seed was placed in the lower inner quadrant of the left breast to mark an area of a papilloma. The neoprobe was set to I-125 in the area of radioactivity was readily identified. The area around this was infiltrated with quarter percent Marcaine. A curvilinear incision was made along the inferior edge of the areola with a 15 blade knife. The incision was carried through the skin and subcutaneous tissue sharply with electrocautery. While checking the area of radioactivity frequently with the neoprobe a circular portion of breast tissue was then excised sharply around the radioactive seed. Once the specimen was removed it was oriented with the appropriate paint colors. A specimen radiograph was obtained that showed the clip and seed to be near the center of the specimen. The specimen was then sent to pathology for further evaluation. Hemostasis was achieved using  the Bovie electrocautery. The wound was irrigated with saline and infiltrated with more quarter percent Marcaine. The deep layer of the wound was then closed with layers of interrupted 3-0 Vicryl stitches. The skin was then closed with interrupted 4-0 Monocryl subcuticular stitches. Dermabond dressings were applied. The patient tolerated the procedure well. At the end of the case all needle sponge and instrument counts were correct. The patient was then awakened and taken to recovery in stable condition.  PLAN OF CARE: Discharge to home after PACU  PATIENT DISPOSITION:  PACU - hemodynamically stable.   Delay start of Pharmacological VTE agent (>24hrs) due to surgical blood loss or risk of bleeding: not applicable

## 2016-11-01 NOTE — Anesthesia Procedure Notes (Signed)
Procedure Name: LMA Insertion Date/Time: 11/01/2016 8:42 AM Performed by: Luciana Axe K Pre-anesthesia Checklist: Patient identified, Emergency Drugs available, Suction available and Patient being monitored Patient Re-evaluated:Patient Re-evaluated prior to induction Oxygen Delivery Method: Circle System Utilized Preoxygenation: Pre-oxygenation with 100% oxygen Induction Type: IV induction Ventilation: Mask ventilation without difficulty LMA: LMA inserted LMA Size: 4.0 Number of attempts: 1 Airway Equipment and Method: Bite block Placement Confirmation: positive ETCO2 Tube secured with: Tape Dental Injury: Teeth and Oropharynx as per pre-operative assessment

## 2016-11-01 NOTE — H&P (Signed)
Julia Shaw  Location: [facility] Patient #: [id number] DOB: [birth year] Single / Language: Cleophus Molt / Race: Black or African American Female   History of Present Illness  The patient is a 41 year old female who presents with a breast mass. We are asked to see the patient in consultation by Dr. Lajean Manes to evaluate her for a left breast papilloma in sclerosing adenosis. The patient is a 41 year old black female who recently went for her first screening mammogram. At that time she was found to have an abnormal area of calcification measuring 1.4 cm in the upper inner left breast as well as another abnormality that was smaller in the lower inner left breast. Both of these were biopsied and came back as sclerosing adenosis and a papilloma. She had been noticing some tingling and hurting in the left breast for about the last month. She has also had some clear nipple discharge. She has no other personal or family history of breast cancer.   Past Surgical History  Breast Biopsy  Left. Foot Surgery  Left. Oral Surgery   Diagnostic Studies History  Colonoscopy  never Pap Smear  1-5 years ago  Allergies  No Known Drug Allergies  Allergies Reconciled   Medication History  No Current Medications Medications Reconciled  Social History  Caffeine use  Coffee, Tea. No drug use  Tobacco use  Never smoker.  Family History  Arthritis  Father, Mother. Diabetes Mellitus  Mother. Heart disease in female family member before age 36  Respiratory Condition  Father.  Pregnancy / Birth History Age at menarche  68 years. Maternal age  72-20 Para  2 Regular periods   Other Problems  Arthritis  Asthma  Gastroesophageal Reflux Disease     Review of Systems General Not Present- Appetite Loss, Chills, Fatigue, Fever, Night Sweats, Weight Gain and Weight Loss. Skin Not Present- Change in Wart/Mole, Dryness, Hives, Jaundice, New Lesions,  Non-Healing Wounds, Rash and Ulcer. HEENT Present- Seasonal Allergies. Not Present- Earache, Hearing Loss, Hoarseness, Nose Bleed, Oral Ulcers, Ringing in the Ears, Sinus Pain, Sore Throat, Visual Disturbances, Wears glasses/contact lenses and Yellow Eyes. Breast Present- Breast Pain. Not Present- Breast Mass, Nipple Discharge and Skin Changes. Cardiovascular Present- Swelling of Extremities. Not Present- Chest Pain, Difficulty Breathing Lying Down, Leg Cramps, Palpitations, Rapid Heart Rate and Shortness of Breath. Gastrointestinal Not Present- Abdominal Pain, Bloating, Bloody Stool, Change in Bowel Habits, Chronic diarrhea, Constipation, Difficulty Swallowing, Excessive gas, Gets full quickly at meals, Hemorrhoids, Indigestion, Nausea, Rectal Pain and Vomiting. Female Genitourinary Not Present- Frequency, Nocturia, Painful Urination, Pelvic Pain and Urgency. Neurological Not Present- Decreased Memory, Fainting, Headaches, Numbness, Seizures, Tingling, Tremor, Trouble walking and Weakness. Psychiatric Not Present- Anxiety, Bipolar, Change in Sleep Pattern, Depression, Fearful and Frequent crying. Endocrine Not Present- Cold Intolerance, Excessive Hunger, Hair Changes, Heat Intolerance, Hot flashes and New Diabetes. Hematology Not Present- Blood Thinners, Easy Bruising, Excessive bleeding, Gland problems, HIV and Persistent Infections.  Vitals  Weight: 193.2 lb Height: 60in Body Surface Area: 1.84 m Body Mass Index: 37.73 kg/m  Temp.: 97.47F  Pulse: 96 (Regular)  P.OX: 98% (Room air) BP: 118/72 (Sitting, Left Arm, Standard)       Physical Exam General Mental Status-Alert. General Appearance-Consistent with stated age. Hydration-Well hydrated. Voice-Normal.  Head and Neck Head-normocephalic, atraumatic with no lesions or palpable masses. Trachea-midline. Thyroid Gland Characteristics - normal size and consistency.  Eye Eyeball - Bilateral-Extraocular  movements intact. Sclera/Conjunctiva - Bilateral-No scleral icterus.  Chest and Lung Exam Chest and  lung exam reveals -quiet, even and easy respiratory effort with no use of accessory muscles and on auscultation, normal breath sounds, no adventitious sounds and normal vocal resonance. Inspection Chest Wall - Normal. Back - normal.  Breast Note: There is no palpable mass in either breast. There is no palpable axillary, supraclavicular, or cervical lymphadenopathy.   Cardiovascular Cardiovascular examination reveals -normal heart sounds, regular rate and rhythm with no murmurs and normal pedal pulses bilaterally.  Abdomen Inspection Inspection of the abdomen reveals - No Hernias. Skin - Scar - no surgical scars. Palpation/Percussion Palpation and Percussion of the abdomen reveal - Soft, Non Tender, No Rebound tenderness, No Rigidity (guarding) and No hepatosplenomegaly. Auscultation Auscultation of the abdomen reveals - Bowel sounds normal.  Neurologic Neurologic evaluation reveals -alert and oriented x 3 with no impairment of recent or remote memory. Mental Status-Normal.  Musculoskeletal Normal Exam - Left-Upper Extremity Strength Normal and Lower Extremity Strength Normal. Normal Exam - Right-Upper Extremity Strength Normal and Lower Extremity Strength Normal.  Lymphatic Head & Neck  General Head & Neck Lymphatics: Bilateral - Description - Normal. Axillary  General Axillary Region: Bilateral - Description - Normal. Tenderness - Non Tender. Femoral & Inguinal  Generalized Femoral & Inguinal Lymphatics: Bilateral - Description - Normal. Tenderness - Non Tender.    Assessment & Plan  SCLEROSING ADENOSIS OF BREAST, LEFT (N60.22) Impression: The patient appears to have an area of sclerosing adenosis in the upper inner left breast as well as a papilloma in the lower inner left breast. Although each of these areas is benign and they both have a very abnormal  appearance on her mammogram and because of the risk of missing something more significant and think it would be reasonable to remove these areas. She would also like to have this done. I have discussed with her in detail the risks and benefits of the operation as well as some of the technical aspects and she understands and wishes to proceed. I will plan for 2 left breast radioactive seed localized lumpectomies. Current Plans Pt Education - Breast Diseases: discussed with patient and provided information.  The radiologists felt only one of these areas was worth removing so they only placed one seed.

## 2016-11-01 NOTE — Anesthesia Postprocedure Evaluation (Signed)
Anesthesia Post Note  Patient: Julia Shaw  Procedure(s) Performed: LEFT BREAST RADIOACTIVE SEED GUIDED LUMPECTOMY (Left Breast)     Patient location during evaluation: PACU Anesthesia Type: General Level of consciousness: awake and alert Pain management: pain level controlled Vital Signs Assessment: post-procedure vital signs reviewed and stable Respiratory status: spontaneous breathing, nonlabored ventilation and respiratory function stable Cardiovascular status: blood pressure returned to baseline and stable Postop Assessment: no apparent nausea or vomiting Anesthetic complications: no    Last Vitals:  Vitals:   11/01/16 1100 11/01/16 1109  BP:  126/77  Pulse:  79  Resp:  16  Temp: 36.8 C   SpO2:  100%    Last Pain:  Vitals:   11/01/16 1109  PainSc: 0-No pain                 Jamario Colina A.

## 2016-11-02 ENCOUNTER — Encounter (HOSPITAL_COMMUNITY): Payer: Self-pay | Admitting: General Surgery

## 2016-11-02 NOTE — Progress Notes (Signed)
Please let patient know pathology is benign.

## 2017-06-05 IMAGING — CR DG ANKLE COMPLETE 3+V*R*
3 series · 3 of 3 positions shown · non-contrast
Comparison: Right ankle radiographs from 03/25/2014

CLINICAL DATA: Subacute onset of medial right ankle pain. Initial
encounter.

EXAM:
RIGHT ANKLE - COMPLETE 3+ VIEW

[x ankle ap right]
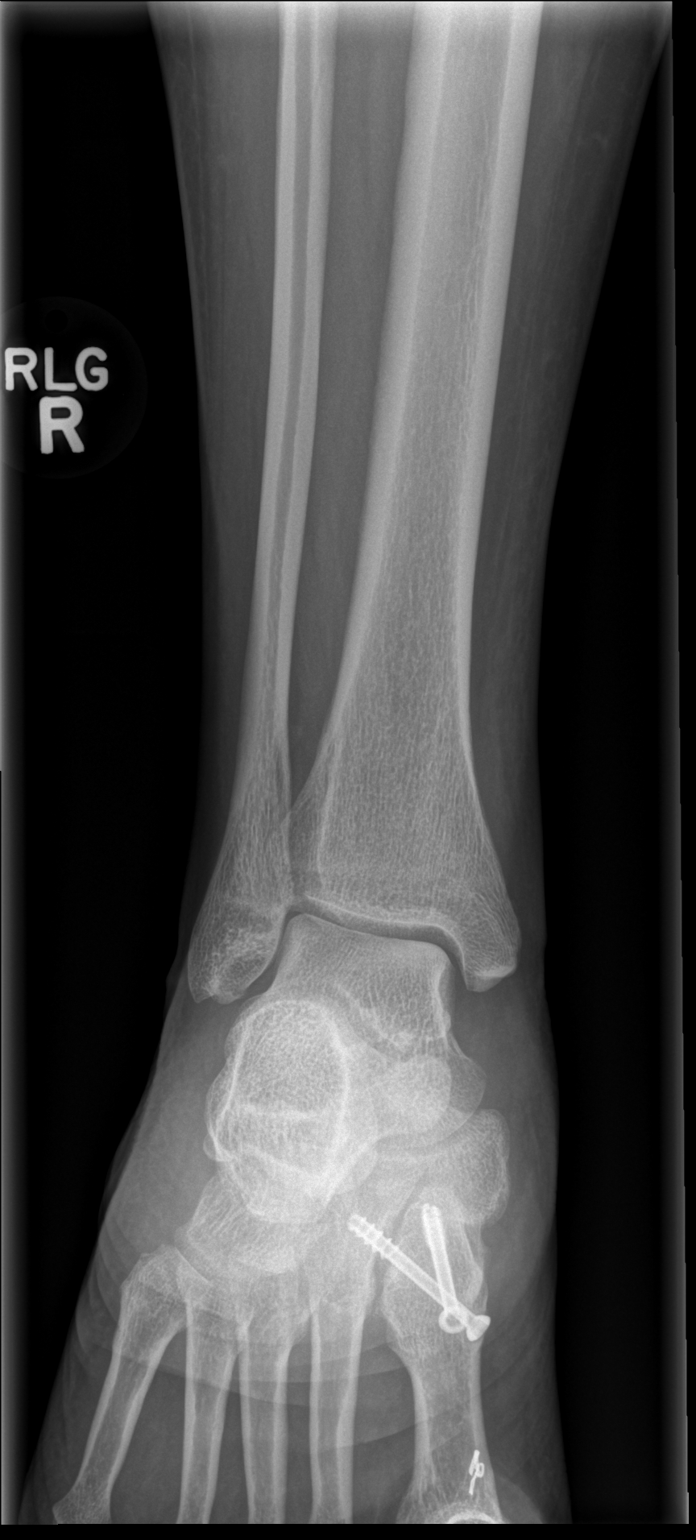

[x ankle obl right]
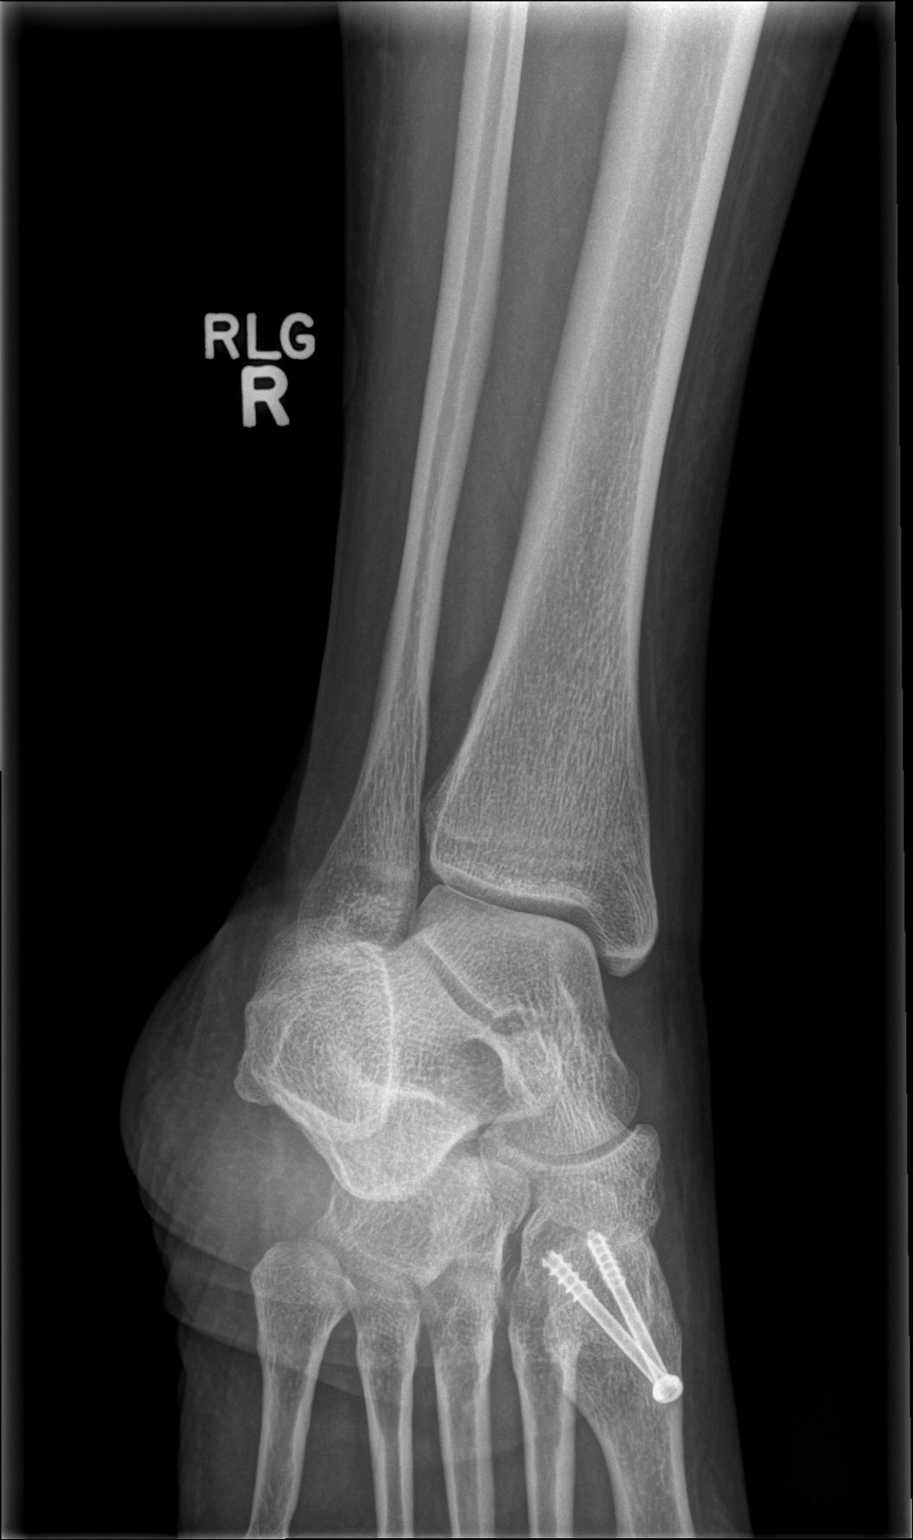

[x ankle lat right]
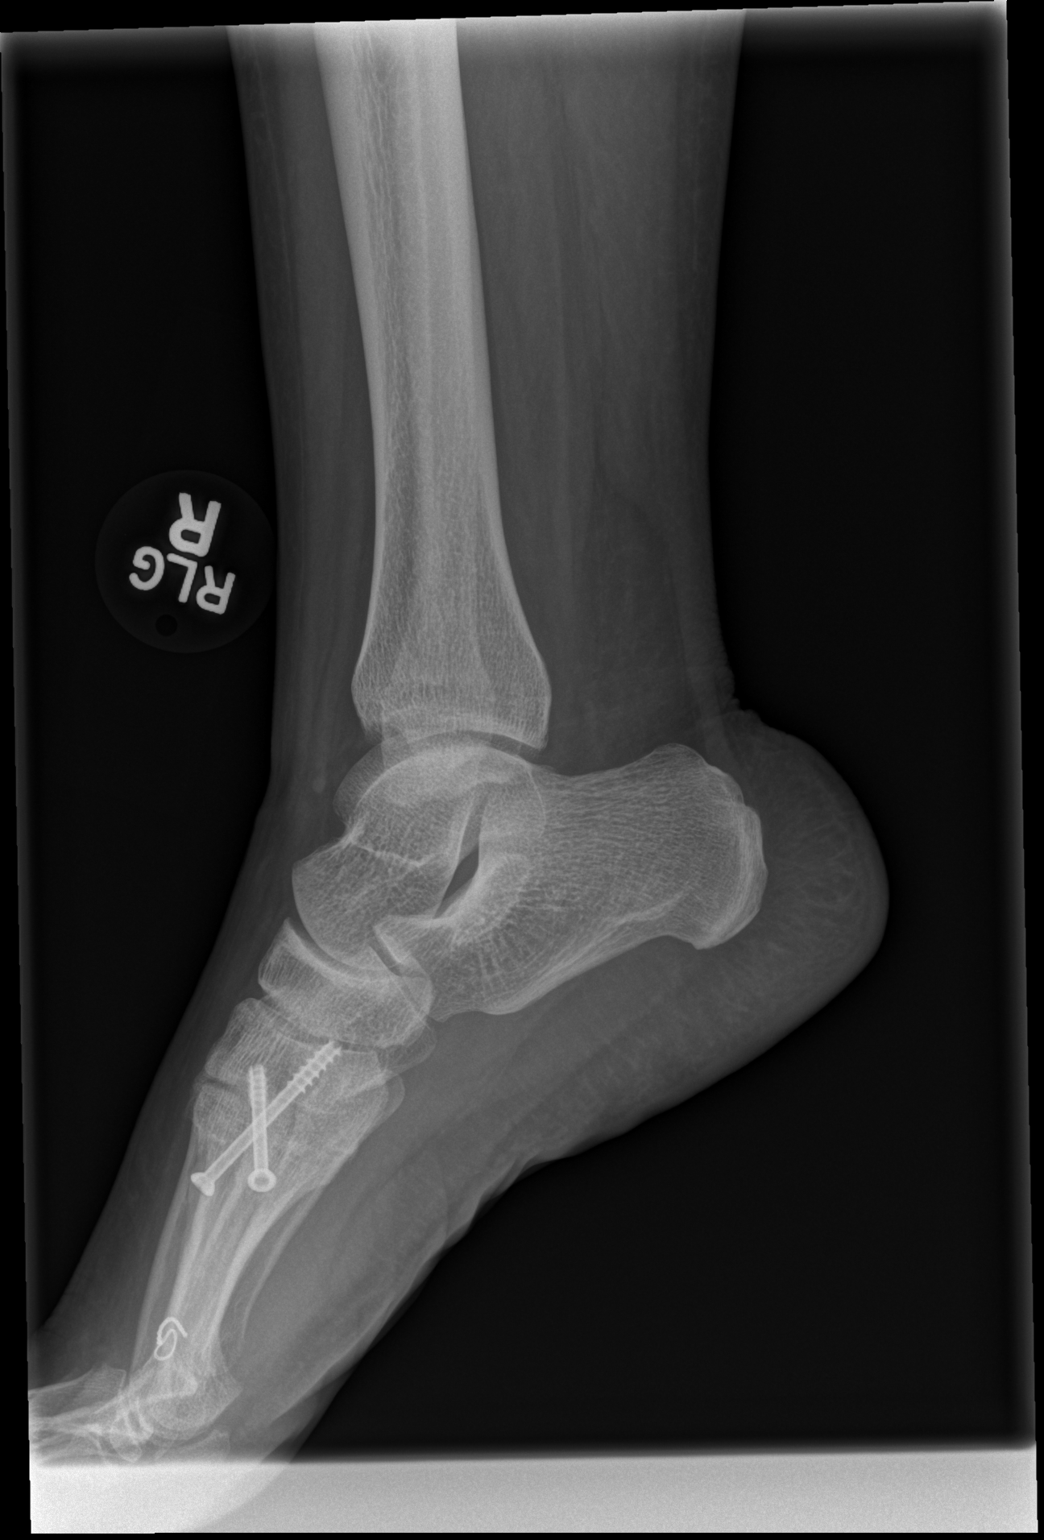

[3 of 3 positions shown; findings below may reference images not displayed]

FINDINGS: There is no evidence of fracture or dislocation. The ankle mortise
is intact; the interosseous space is within normal limits. No talar
tilt or subluxation is seen. Screws are noted along the bases of the
first and second metatarsals.

The joint spaces are preserved. No significant soft tissue
abnormalities are seen.
IMPRESSION: No evidence of fracture or dislocation.

## 2017-06-18 ENCOUNTER — Other Ambulatory Visit: Payer: Self-pay

## 2017-06-18 ENCOUNTER — Encounter (HOSPITAL_COMMUNITY): Payer: Self-pay | Admitting: Student

## 2017-06-18 ENCOUNTER — Inpatient Hospital Stay (HOSPITAL_COMMUNITY)
Admission: AD | Admit: 2017-06-18 | Discharge: 2017-06-18 | Disposition: A | Discharge status: Home / Self Care | Payer: Medicaid Other | Source: Ambulatory Visit | Attending: Obstetrics and Gynecology | Admitting: Obstetrics and Gynecology

## 2017-06-18 DIAGNOSIS — N898 Other specified noninflammatory disorders of vagina: Secondary | ICD-10-CM | POA: Diagnosis present

## 2017-06-18 DIAGNOSIS — Z833 Family history of diabetes mellitus: Secondary | ICD-10-CM | POA: Insufficient documentation

## 2017-06-18 DIAGNOSIS — Z8349 Family history of other endocrine, nutritional and metabolic diseases: Secondary | ICD-10-CM | POA: Insufficient documentation

## 2017-06-18 DIAGNOSIS — Z8619 Personal history of other infectious and parasitic diseases: Secondary | ICD-10-CM | POA: Diagnosis not present

## 2017-06-18 DIAGNOSIS — J45909 Unspecified asthma, uncomplicated: Secondary | ICD-10-CM | POA: Diagnosis not present

## 2017-06-18 DIAGNOSIS — Z113 Encounter for screening for infections with a predominantly sexual mode of transmission: Secondary | ICD-10-CM | POA: Diagnosis not present

## 2017-06-18 DIAGNOSIS — Z8249 Family history of ischemic heart disease and other diseases of the circulatory system: Secondary | ICD-10-CM | POA: Insufficient documentation

## 2017-06-18 DIAGNOSIS — N760 Acute vaginitis: Secondary | ICD-10-CM | POA: Diagnosis not present

## 2017-06-18 DIAGNOSIS — M1990 Unspecified osteoarthritis, unspecified site: Secondary | ICD-10-CM | POA: Diagnosis not present

## 2017-06-18 DIAGNOSIS — B9689 Other specified bacterial agents as the cause of diseases classified elsewhere: Secondary | ICD-10-CM | POA: Diagnosis not present

## 2017-06-18 HISTORY — DX: Personal history of other infectious and parasitic diseases: Z86.19

## 2017-06-18 LAB — WET PREP, GENITAL
SPERM: NONE SEEN
TRICH WET PREP: NONE SEEN
YEAST WET PREP: NONE SEEN

## 2017-06-18 LAB — URINALYSIS, ROUTINE W REFLEX MICROSCOPIC
Bilirubin Urine: NEGATIVE
GLUCOSE, UA: NEGATIVE mg/dL
Hgb urine dipstick: NEGATIVE
KETONES UR: NEGATIVE mg/dL
LEUKOCYTES UA: NEGATIVE
NITRITE: NEGATIVE
PROTEIN: NEGATIVE mg/dL
Specific Gravity, Urine: 1.02 (ref 1.005–1.030)
pH: 6 (ref 5.0–8.0)

## 2017-06-18 LAB — POCT PREGNANCY, URINE: PREG TEST UR: NEGATIVE

## 2017-06-18 LAB — PREGNANCY, URINE: PREG TEST UR: NEGATIVE

## 2017-06-18 MED ORDER — METRONIDAZOLE 0.75 % VA GEL: 1.0000 | Freq: Two times a day (BID) | VAGINAL | 0 refills | Status: DC

## 2017-06-18 NOTE — Discharge Instructions (Signed)
Bacterial Vaginosis Bacterial vaginosis is a vaginal infection that occurs when the normal balance of bacteria in the vagina is disrupted. It results from an overgrowth of certain bacteria. This is the most common vaginal infection among women ages 15-44. Because bacterial vaginosis increases your risk for STIs (sexually transmitted infections), getting treated can help reduce your risk for chlamydia, gonorrhea, herpes, and HIV (human immunodeficiency virus). Treatment is also important for preventing complications in pregnant women, because this condition can cause an early (premature) delivery. What are the causes? This condition is caused by an increase in harmful bacteria that are normally present in small amounts in the vagina. However, the reason that the condition develops is not fully understood. What increases the risk? The following factors may make you more likely to develop this condition:  Having a new sexual partner or multiple sexual partners.  Having unprotected sex.  Douching.  Having an intrauterine device (IUD).  Smoking.  Drug and alcohol abuse.  Taking certain antibiotic medicines.  Being pregnant.  You cannot get bacterial vaginosis from toilet seats, bedding, swimming pools, or contact with objects around you. What are the signs or symptoms? Symptoms of this condition include:  Grey or white vaginal discharge. The discharge can also be watery or foamy.  A fish-like odor with discharge, especially after sexual intercourse or during menstruation.  Itching in and around the vagina.  Burning or pain with urination.  Some women with bacterial vaginosis have no signs or symptoms. How is this diagnosed? This condition is diagnosed based on:  Your medical history.  A physical exam of the vagina.  Testing a sample of vaginal fluid under a microscope to look for a large amount of bad bacteria or abnormal cells. Your health care provider may use a cotton swab  or a small wooden spatula to collect the sample.  How is this treated? This condition is treated with antibiotics. These may be given as a pill, a vaginal cream, or a medicine that is put into the vagina (suppository). If the condition comes back after treatment, a second round of antibiotics may be needed. Follow these instructions at home: Medicines  Take over-the-counter and prescription medicines only as told by your health care provider.  Take or use your antibiotic as told by your health care provider. Do not stop taking or using the antibiotic even if you start to feel better. General instructions  If you have a female sexual partner, tell her that you have a vaginal infection. She should see her health care provider and be treated if she has symptoms. If you have a female sexual partner, he does not need treatment.  During treatment: ? Avoid sexual activity until you finish treatment. ? Do not douche. ? Avoid alcohol as directed by your health care provider. ? Avoid breastfeeding as directed by your health care provider.  Drink enough water and fluids to keep your urine clear or pale yellow.  Keep the area around your vagina and rectum clean. ? Wash the area daily with warm water. ? Wipe yourself from front to back after using the toilet.  Keep all follow-up visits as told by your health care provider. This is important. How is this prevented?  Do not douche.  Wash the outside of your vagina with warm water only.  Use protection when having sex. This includes latex condoms and dental dams.  Limit how many sexual partners you have. To help prevent bacterial vaginosis, it is best to have sex with just   one partner (monogamous).  Make sure you and your sexual partner are tested for STIs.  Wear cotton or cotton-lined underwear.  Avoid wearing tight pants and pantyhose, especially during summer.  Limit the amount of alcohol that you drink.  Do not use any products that  contain nicotine or tobacco, such as cigarettes and e-cigarettes. If you need help quitting, ask your health care provider.  Do not use illegal drugs. Where to find more information:  Centers for Disease Control and Prevention: www.cdc.gov/std  American Sexual Health Association (ASHA): www.ashastd.org  [place] Department of Health and Human Services, Office on Women's Health: www.womenshealth.gov/ or https://www.womenshealth.gov/a-z-topics/bacterial-vaginosis Contact a health care provider if:  Your symptoms do not improve, even after treatment.  You have more discharge or pain when urinating.  You have a fever.  You have pain in your abdomen.  You have pain during sex.  You have vaginal bleeding between periods. Summary  Bacterial vaginosis is a vaginal infection that occurs when the normal balance of bacteria in the vagina is disrupted.  Because bacterial vaginosis increases your risk for STIs (sexually transmitted infections), getting treated can help reduce your risk for chlamydia, gonorrhea, herpes, and HIV (human immunodeficiency virus). Treatment is also important for preventing complications in pregnant women, because the condition can cause an early (premature) delivery.  This condition is treated with antibiotic medicines. These may be given as a pill, a vaginal cream, or a medicine that is put into the vagina (suppository). This information is not intended to replace advice given to you by your health care provider. Make sure you discuss any questions you have with your health care provider. Document Released: 01/18/2005 Document Revised: 05/24/2016 Document Reviewed: 10/04/2015 Elsevier Interactive Patient Education  2018 [employer]  

## 2017-06-18 NOTE — Progress Notes (Addendum)
Nonpregn here for vaginal discharge with foul odor and itchiness for past few days. Denies bleeding.   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   Provider at [facility] assessing. Wetprep, GC, and pelvic done.   Lab at [facility]  1805: D/C instructions given with pt understanding. Pt left unit ambulatory.

## 2017-06-18 NOTE — MAU Provider Note (Signed)
History     CSN: [id number]  Arrival date and time: 06/18/17 1623   First Provider Initiated Contact with Patient 06/18/17 1714      Chief Complaint  Patient presents with  . Vaginal Discharge   HPI  Julia Shaw is a 42 y.o. non pregnant female who presents with vaginal discharge and irritation. Symptoms began 2 days ago. Reports thick white vaginal discharge with foul odor. Associated with itching and irritation. Has been switching up her body wash and using bubble baths which she thinks has made it worse. Denies vaginal bleeding or dysuria. Has had 59 female partner for the last year. Uses condoms. Denies dyspareunia or postcoital bleeding. Does want STI testing.   Past Medical History:  Diagnosis Date  . Allergy   . Anemia   . Arthritis   . Asthma   . Breast lesion    Left  . Bronchitis   . Fibroid   . Hx of trichomoniasis   . Papilloma of breast    left    Past Surgical History:  Procedure Laterality Date  . BREAST LUMPECTOMY WITH RADIOACTIVE SEED LOCALIZATION Left 11/01/2016   Procedure: LEFT BREAST RADIOACTIVE SEED GUIDED LUMPECTOMY;  Surgeon: Jovita Kussmaul, MD;  Location: [facility];  Service: General;  Laterality: Left;  . BUNIONECTOMY     right foot  . DENTAL SURGERY    . ROBOT ASSISTED MYOMECTOMY  03/03/2011   Procedure: ROBOTIC ASSISTED MYOMECTOMY;  Surgeon: Alwyn Pea, MD;  Location: [facility];  Service: Gynecology;  Laterality: N/A;  . TUBAL LIGATION      Family History  Problem Relation Age of Onset  . Diabetes Mother   . Hypertension Mother   . Thyroid disease Father   . Heart disease Father   . Hyperlipidemia Father   . Cancer Maternal Grandmother     Social History   Tobacco Use  . Smoking status: Never Smoker  . Smokeless tobacco: Never Used  Substance Use Topics  . Alcohol use: No  . Drug use: No    Allergies: No Known Allergies  Medications Prior to Admission  Medication Sig Dispense Refill Last Dose  . albuterol (PROVENTIL  HFA;VENTOLIN HFA) 108 (90 Base) MCG/ACT inhaler Inhale 1-2 puffs into the lungs every 6 (six) hours as needed for wheezing or shortness of breath. 1 Inhaler 0 Past Month at Unknown time  . diclofenac (VOLTAREN) 75 MG EC tablet Take 1 tablet (75 mg total) by mouth 2 (two) times daily. 30 tablet 0 Past Week at Unknown time  . DULERA 100-5 MCG/ACT AERO    10/31/2016 at Unknown time  . HYDROcodone-acetaminophen (NORCO/VICODIN) 5-325 MG tablet Take 1-2 tablets by mouth every 4 (four) hours as needed for moderate pain or severe pain. 10 tablet 0   . montelukast (SINGULAIR) 10 MG tablet Take 10 mg by mouth at bedtime.   10/31/2016 at Unknown time    Review of Systems  Constitutional: Negative.   Gastrointestinal: Negative.   Genitourinary: Positive for vaginal discharge. Negative for dyspareunia, dysuria, genital sores and vaginal bleeding.       + vaginal itching   Physical Exam   Blood pressure 126/68, pulse (!) 109, temperature 98.6 F (37 C), temperature source Oral, resp. rate 18, height 5' (1.524 m), weight 206 lb (93.4 kg).  Physical Exam  Nursing note and vitals reviewed. Constitutional: She is oriented to person, place, and time. She appears well-developed and well-nourished. No distress.  HENT:  Head: Normocephalic and atraumatic.  Eyes:  Conjunctivae are normal. Right eye exhibits no discharge. Left eye exhibits no discharge. No scleral icterus.  Neck: Normal range of motion.  Respiratory: Effort normal. No respiratory distress.  Genitourinary: Cervix exhibits no motion tenderness and no friability. No bleeding in the vagina. Vaginal discharge found.  Genitourinary Comments: Moderate amount of foul smelling thin yellow discharge.   Neurological: She is alert and oriented to person, place, and time.  Skin: Skin is warm and dry. She is not diaphoretic.  Psychiatric: She has a normal mood and affect. Her behavior is normal. Judgment and thought content normal.    MAU Course   Procedures Results for orders placed or performed during the hospital encounter of 06/18/17 (from the past 24 hour(s))  Urinalysis, Routine w reflex microscopic     Status: None   Collection Time: 06/18/17  4:55 PM  Result Value Ref Range   Color, Urine YELLOW YELLOW   APPearance CLEAR CLEAR   Specific Gravity, Urine 1.020 1.005 - 1.030   pH 6.0 5.0 - 8.0   Glucose, UA NEGATIVE NEGATIVE mg/dL   Hgb urine dipstick NEGATIVE NEGATIVE   Bilirubin Urine NEGATIVE NEGATIVE   Ketones, ur NEGATIVE NEGATIVE mg/dL   Protein, ur NEGATIVE NEGATIVE mg/dL   Nitrite NEGATIVE NEGATIVE   Leukocytes, UA NEGATIVE NEGATIVE  Pregnancy, urine     Status: None   Collection Time: 06/18/17  4:55 PM  Result Value Ref Range   Preg Test, Ur NEGATIVE NEGATIVE  Pregnancy, urine POC     Status: None   Collection Time: 06/18/17  4:56 PM  Result Value Ref Range   Preg Test, Ur NEGATIVE NEGATIVE  Wet prep, genital     Status: Abnormal   Collection Time: 06/18/17  5:29 PM  Result Value Ref Range   Yeast Wet Prep HPF POC NONE SEEN NONE SEEN   Trich, Wet Prep NONE SEEN NONE SEEN   Clue Cells Wet Prep HPF POC PRESENT (A) NONE SEEN   WBC, Wet Prep HPF POC FEW (A) NONE SEEN   Sperm NONE SEEN     MDM UPT negative Pt opts for STI testing. GC/CT, wet prep, HIV, RPR, Hep B/C ordered  Assessment and Plan  A:  1. BV (bacterial vaginosis)   2. Screen for STD (sexually transmitted disease)    P: Discharge home Rx metrogel GC/CT, HIV, RPR, Hep pending   Jorje Guild 06/18/2017, 5:14 PM

## 2017-06-19 LAB — HEPATITIS C ANTIBODY: HCV Ab: 0.1 s/co ratio (ref 0.0–0.9)

## 2017-06-19 LAB — HIV ANTIBODY (ROUTINE TESTING W REFLEX): HIV Screen 4th Generation wRfx: NONREACTIVE

## 2017-06-19 LAB — HEPATITIS B SURFACE ANTIGEN: Hepatitis B Surface Ag: NEGATIVE

## 2017-06-19 LAB — RPR: RPR: NONREACTIVE

## 2017-06-20 LAB — GC/CHLAMYDIA PROBE AMP (~~LOC~~) NOT AT ARMC
Chlamydia: NEGATIVE
Neisseria Gonorrhea: NEGATIVE

## 2017-09-01 ENCOUNTER — Encounter (HOSPITAL_COMMUNITY): Payer: Self-pay | Admitting: Emergency Medicine

## 2017-09-01 ENCOUNTER — Other Ambulatory Visit: Payer: Self-pay

## 2017-09-01 DIAGNOSIS — Z79899 Other long term (current) drug therapy: Secondary | ICD-10-CM | POA: Insufficient documentation

## 2017-09-01 DIAGNOSIS — J45901 Unspecified asthma with (acute) exacerbation: Secondary | ICD-10-CM | POA: Diagnosis not present

## 2017-09-01 DIAGNOSIS — R0602 Shortness of breath: Secondary | ICD-10-CM | POA: Diagnosis not present

## 2017-09-01 DIAGNOSIS — J45909 Unspecified asthma, uncomplicated: Secondary | ICD-10-CM | POA: Insufficient documentation
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
## 2017-09-02 ENCOUNTER — Other Ambulatory Visit: Payer: Self-pay

## 2017-09-02 ENCOUNTER — Emergency Department (HOSPITAL_COMMUNITY)
Admission: EM | Admit: 2017-09-02 | Discharge: 2017-09-02 | Disposition: A | Discharge status: Home / Self Care | Payer: Medicaid Other | Attending: Emergency Medicine | Admitting: Emergency Medicine

## 2017-09-02 DIAGNOSIS — J45909 Unspecified asthma, uncomplicated: Secondary | ICD-10-CM

## 2017-09-02 MED ORDER — ALBUTEROL SULFATE HFA 108 (90 BASE) MCG/ACT IN AERS
1.0000 | INHALATION_SPRAY | Freq: Once | RESPIRATORY_TRACT | Status: AC
  Administered 2017-09-02: 1 via RESPIRATORY_TRACT
  Filled 2017-09-02: qty 6.7

## 2017-09-02 NOTE — Discharge Instructions (Addendum)
Follow-up with your primary care doctor for further management of your asthma. Return to the emergency room with any new, worsening, or concerning symptoms.
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
## 2017-10-22 ENCOUNTER — Encounter (HOSPITAL_COMMUNITY): Payer: Self-pay | Admitting: Emergency Medicine

## 2017-10-22 ENCOUNTER — Ambulatory Visit (INDEPENDENT_AMBULATORY_CARE_PROVIDER_SITE_OTHER): Payer: Medicare Other

## 2017-10-22 ENCOUNTER — Ambulatory Visit (HOSPITAL_COMMUNITY)
Admission: EM | Admit: 2017-10-22 | Discharge: 2017-10-22 | Disposition: A | Discharge status: Home / Self Care | Payer: Medicare Other | Attending: Family Medicine | Admitting: Family Medicine

## 2017-10-22 DIAGNOSIS — J22 Unspecified acute lower respiratory infection: Secondary | ICD-10-CM

## 2017-10-22 DIAGNOSIS — J4521 Mild intermittent asthma with (acute) exacerbation: Secondary | ICD-10-CM

## 2017-10-22 DIAGNOSIS — R05 Cough: Secondary | ICD-10-CM | POA: Diagnosis not present

## 2017-10-22 MED ORDER — IPRATROPIUM-ALBUTEROL 0.5-2.5 (3) MG/3ML IN SOLN
RESPIRATORY_TRACT | Status: AC
  Filled 2017-10-22: qty 3

## 2017-10-22 MED ORDER — IPRATROPIUM-ALBUTEROL 0.5-2.5 (3) MG/3ML IN SOLN
3.0000 mL | Freq: Once | RESPIRATORY_TRACT | Status: AC
  Administered 2017-10-22: 3 mL via RESPIRATORY_TRACT

## 2017-10-22 MED ORDER — BENZONATATE 200 MG PO CAPS: 200.0000 mg | ORAL_CAPSULE | Freq: Three times a day (TID) | ORAL | 0 refills | Status: AC | PRN

## 2017-10-22 MED ORDER — ALBUTEROL SULFATE HFA 108 (90 BASE) MCG/ACT IN AERS: 1.0000 | INHALATION_SPRAY | Freq: Four times a day (QID) | RESPIRATORY_TRACT | 0 refills | Status: DC | PRN

## 2017-10-22 MED ORDER — AZITHROMYCIN 250 MG PO TABS: 250.0000 mg | ORAL_TABLET | Freq: Every day | ORAL | 0 refills | Status: DC

## 2017-10-22 MED ORDER — PREDNISONE 50 MG PO TABS: 50.0000 mg | ORAL_TABLET | Freq: Every day | ORAL | 0 refills | Status: AC

## 2017-10-22 MED ORDER — FLUTICASONE PROPIONATE 50 MCG/ACT NA SUSP: 1.0000 | Freq: Every day | NASAL | 0 refills | Status: DC

## 2017-10-22 NOTE — ED Provider Notes (Signed)
[facility]    CSN: [id number] Arrival date & time: 10/22/17  1529     History   Chief Complaint Chief Complaint  Patient presents with  . Wheezing  . Chest Pain  . Shortness of Breath    HPI Julia Shaw is a 42 y.o. female history of asthma presenting today for evaluation of shortness of breath and wheezing.  Patient states that she has had a persistent cough over the past week.  She has also had associated congestion and rhinorrhea.  She denies sore throat.  Denies fevers.  States that she is out of her albuterol inhaler at home.  She has not tried any medicines over-the-counter.  She has had some chest discomfort associated with coughing.  Denies leg pain or leg swelling.  Denies acute worsening of her symptoms.  Unrelated to exertion.  HPI  Past Medical History:  Diagnosis Date  . Allergy   . Anemia   . Arthritis   . Asthma   . Breast lesion    Left  . Bronchitis   . Fibroid   . Hx of trichomoniasis   . Papilloma of breast    left    Patient Active Problem List   Diagnosis Date Noted  . Asthma 08/30/2015  . Allergy 08/30/2015  . Fibroid, uterus 03/03/2011    Past Surgical History:  Procedure Laterality Date  . BREAST LUMPECTOMY WITH RADIOACTIVE SEED LOCALIZATION Left 11/01/2016   Procedure: LEFT BREAST RADIOACTIVE SEED GUIDED LUMPECTOMY;  Surgeon: Jovita Kussmaul, MD;  Location: [facility];  Service: General;  Laterality: Left;  . BUNIONECTOMY     right foot  . DENTAL SURGERY    . ROBOT ASSISTED MYOMECTOMY  03/03/2011   Procedure: ROBOTIC ASSISTED MYOMECTOMY;  Surgeon: Alwyn Pea, MD;  Location: [facility];  Service: Gynecology;  Laterality: N/A;  . TUBAL LIGATION      OB History    Gravida  3   Para  2   Term  2   Preterm      AB  1   Living  2     SAB  1   TAB      Ectopic      Multiple      Live Births  2            Home Medications    Prior to Admission medications   Medication Sig Start Date End Date  Taking? Authorizing Provider  albuterol (PROVENTIL HFA;VENTOLIN HFA) 108 (90 Base) MCG/ACT inhaler Inhale 1-2 puffs into the lungs every 6 (six) hours as needed for wheezing or shortness of breath. 10/22/17   Ronna Herskowitz C, PA-C  azithromycin (ZITHROMAX) 250 MG tablet Take 1 tablet (250 mg total) by mouth daily. Take first 2 tablets together, then 1 every day until finished. 10/22/17   Dvaughn Fickle C, PA-C  benzonatate (TESSALON) 200 MG capsule Take 1 capsule (200 mg total) by mouth 3 (three) times daily as needed for up to 7 days for cough. 10/22/17 10/29/17  Mariellen Blaney C, PA-C  diclofenac (VOLTAREN) 75 MG EC tablet Take 1 tablet (75 mg total) by mouth 2 (two) times daily. 08/30/15   Gale Journey, Damaris Hippo, PA-C  DULERA 100-5 MCG/ACT AERO  08/14/15   [provider]  fluticasone (FLONASE) 50 MCG/ACT nasal spray Place 1-2 sprays into both nostrils daily for 7 days. 10/22/17 10/29/17  Riad Wagley C, PA-C  HYDROcodone-acetaminophen (NORCO/VICODIN) 5-325 MG tablet Take 1-2 tablets by mouth every 4 (four)  hours as needed for moderate pain or severe pain. 11/01/16   Autumn Messing III, MD  metroNIDAZOLE (METROGEL VAGINAL) 0.75 % vaginal gel Place 1 Applicatorful vaginally 2 (two) times daily. 06/18/17   Jorje Guild, NP  montelukast (SINGULAIR) 10 MG tablet Take 10 mg by mouth at bedtime.    [provider]  predniSONE (DELTASONE) 50 MG tablet Take 1 tablet (50 mg total) by mouth daily for 5 days. With food 10/22/17 10/27/17  Hooria Gasparini, Elesa Hacker, PA-C    Family History Family History  Problem Relation Age of Onset  . Diabetes Mother   . Hypertension Mother   . Thyroid disease Father   . Heart disease Father   . Hyperlipidemia Father   . Cancer Maternal Grandmother     Social History Social History   Tobacco Use  . Smoking status: Never Smoker  . Smokeless tobacco: Never Used  Substance Use Topics  . Alcohol use: No  . Drug use: No     Allergies   Patient has no known  allergies.   Review of Systems Review of Systems  Constitutional: Negative for activity change, appetite change, chills, fatigue and fever.  HENT: Positive for congestion and rhinorrhea. Negative for ear pain, sinus pressure, sore throat and trouble swallowing.   Eyes: Negative for discharge and redness.  Respiratory: Positive for cough, chest tightness, shortness of breath and wheezing.   Cardiovascular: Negative for chest pain.  Gastrointestinal: Negative for abdominal pain, diarrhea, nausea and vomiting.  Musculoskeletal: Negative for myalgias.  Skin: Negative for rash.  Neurological: Negative for dizziness, light-headedness and headaches.     Physical Exam Triage Vital Signs ED Triage Vitals [10/22/17 1553]  Enc Vitals Group     BP 122/72     Pulse Rate (!) 103     Resp 20     Temp 98.5 F (36.9 C)     Temp Source Oral     SpO2 100 %     Weight      Height      Head Circumference      Peak Flow      Pain Score      Pain Loc      Pain Edu?      Excl. in [place]?    No data found.  Updated Vital Signs BP 122/72 (BP Location: Left Arm)   Pulse (!) 103   Temp 98.5 F (36.9 C) (Oral)   Resp 20   LMP 10/11/2017   SpO2 100%   Visual Acuity Right Eye Distance:   Left Eye Distance:   Bilateral Distance:    Right Eye Near:   Left Eye Near:    Bilateral Near:     Physical Exam  Constitutional: She appears well-developed and well-nourished. No distress.  HENT:  Head: Normocephalic and atraumatic.  Left TM with effusion, no erythema  Oral mucosa pink and moist, no tonsillar enlargement or exudate. Posterior pharynx patent and nonerythematous, no uvula deviation or swelling. Normal phonation.  Eyes: Conjunctivae are normal.  Neck: Neck supple.  Cardiovascular: Regular rhythm.  No murmur heard. tachycardia  Pulmonary/Chest: Effort normal. No respiratory distress.  Breathing comfortably at rest Wheezing with coughing, mild expiratory wheezing with deep breaths  throughout bilateral lung fields   Abdominal: Soft. There is no tenderness.  Musculoskeletal: She exhibits no edema.  Neurological: She is alert.  Skin: Skin is warm and dry.  Psychiatric: She has a normal mood and affect.  Nursing note and vitals reviewed.  UC Treatments / Results  Labs (all labs ordered are listed, but only abnormal results are displayed) Labs Reviewed - No data to display  EKG None  Radiology Dg Chest 2 View  Result Date: 10/22/2017 CLINICAL DATA:  Cough for 1 week. EXAM: CHEST - 2 VIEW COMPARISON:  06/03/2011 FINDINGS: Heart and mediastinal contours are within normal limits. No focal opacities or effusions. No acute bony abnormality. IMPRESSION: No active cardiopulmonary disease. Electronically Signed   By: Rolm Baptise M.D.   On: 10/22/2017 16:25    Procedures Procedures (including critical care time)  Medications Ordered in UC Medications  ipratropium-albuterol (DUONEB) 0.5-2.5 (3) MG/3ML nebulizer solution 3 mL (3 mLs Nebulization Given 10/22/17 1641)    Initial Impression / Assessment and Plan / UC Course  I have reviewed the triage vital signs and the nursing notes.  Pertinent labs & imaging results that were available during my care of the patient were reviewed by me and considered in my medical decision making (see chart for details).     EKG normal sinus rhythm, isolated T wave inversion and V3, appears similar to previous EKG.  No acute signs of ischemia or infarction. URI symptoms likely exacerbating asthma.  Chest x-ray negative for pneumonia.  But given length of symptoms for greater than 1 week will provide azithromycin. Patient provided with DuoNeb in clinic, albuterol inhaler refilled.  Will send home with prednisone for 5 days, Tessalon for cough.  Flonase for congestion/ear effusion.  Discussed continuing to monitor symptoms, breathing, chest discomfort, follow-up if symptoms not improving or worsening.Discussed strict return  precautions. Patient verbalized understanding and is agreeable with plan.  Final Clinical Impressions(s) / UC Diagnoses   Final diagnoses:  Mild intermittent asthma with exacerbation  Lower respiratory infection (e.g., bronchitis, pneumonia, pneumonitis, pulmonitis)     Discharge Instructions     I have refilled your albuterol inhaler, please use as needed for shortness of breath, wheezing, chest tightness Please begin taking prednisone daily with breakfast for the next 5 days, this should help with wheezing and inflammation in lungs Please use Tessalon as needed for cough every 8 hours May begin azithromycin- 2 tablets today, 1 tablet for the next 4 days  Please follow-up if chest discomfort worsening, developing worsening shortness of breath, wheezing, fevers or persistent symptoms.    ED Prescriptions    Medication Sig Dispense Auth. Provider   albuterol (PROVENTIL HFA;VENTOLIN HFA) 108 (90 Base) MCG/ACT inhaler Inhale 1-2 puffs into the lungs every 6 (six) hours as needed for wheezing or shortness of breath. 1 Inhaler Kassi Esteve C, PA-C   predniSONE (DELTASONE) 50 MG tablet Take 1 tablet (50 mg total) by mouth daily for 5 days. With food 5 tablet Bryor Rami C, PA-C   azithromycin (ZITHROMAX) 250 MG tablet Take 1 tablet (250 mg total) by mouth daily. Take first 2 tablets together, then 1 every day until finished. 6 tablet Kelise Kuch C, PA-C   benzonatate (TESSALON) 200 MG capsule Take 1 capsule (200 mg total) by mouth 3 (three) times daily as needed for up to 7 days for cough. 28 capsule Raha Tennison C, PA-C   fluticasone (FLONASE) 50 MCG/ACT nasal spray Place 1-2 sprays into both nostrils daily for 7 days. 1 g Calani Gick, [place] C, PA-C     Controlled Substance Prescriptions [place] Controlled Substance Registry consulted? Not Applicable   Janith Lima, [place] 10/22/17 1645

## 2017-10-22 NOTE — ED Triage Notes (Signed)
Pt presents with shortness of breath due to persistent cough and wheezing from upper respiratory cold.

## 2017-10-22 NOTE — Discharge Instructions (Signed)
I have refilled your albuterol inhaler, please use as needed for shortness of breath, wheezing, chest tightness Please begin taking prednisone daily with breakfast for the next 5 days, this should help with wheezing and inflammation in lungs Please use Tessalon as needed for cough every 8 hours May begin azithromycin- 2 tablets today, 1 tablet for the next 4 days  Please follow-up if chest discomfort worsening, developing worsening shortness of breath, wheezing, fevers or persistent symptoms.

## 2017-11-23 DIAGNOSIS — E663 Overweight: Secondary | ICD-10-CM | POA: Diagnosis not present

## 2017-11-23 DIAGNOSIS — J4540 Moderate persistent asthma, uncomplicated: Secondary | ICD-10-CM | POA: Diagnosis not present

## 2018-04-10 ENCOUNTER — Encounter (HOSPITAL_COMMUNITY): Payer: Self-pay

## 2018-04-10 ENCOUNTER — Other Ambulatory Visit: Payer: Self-pay

## 2018-04-10 ENCOUNTER — Ambulatory Visit (HOSPITAL_COMMUNITY)
Admission: EM | Admit: 2018-04-10 | Discharge: 2018-04-10 | Disposition: A | Discharge status: Home / Self Care | Payer: Medicare Other | Attending: Physician Assistant | Admitting: Physician Assistant

## 2018-04-10 DIAGNOSIS — J4521 Mild intermittent asthma with (acute) exacerbation: Secondary | ICD-10-CM

## 2018-04-10 DIAGNOSIS — J40 Bronchitis, not specified as acute or chronic: Secondary | ICD-10-CM

## 2018-04-10 MED ORDER — PREDNISONE 50 MG PO TABS
50.0000 mg | ORAL_TABLET | Freq: Every day | ORAL | 0 refills | Status: DC
Start: 2018-04-10 — End: 2018-08-11

## 2018-04-10 MED ORDER — DOXYCYCLINE HYCLATE 100 MG PO CAPS: 100.0000 mg | ORAL_CAPSULE | Freq: Two times a day (BID) | ORAL | 0 refills | Status: DC

## 2018-04-10 MED ORDER — ALBUTEROL SULFATE HFA 108 (90 BASE) MCG/ACT IN AERS: 1.0000 | INHALATION_SPRAY | Freq: Four times a day (QID) | RESPIRATORY_TRACT | 0 refills | Status: DC | PRN

## 2018-04-10 MED ORDER — IPRATROPIUM-ALBUTEROL 0.5-2.5 (3) MG/3ML IN SOLN
3.0000 mL | Freq: Once | RESPIRATORY_TRACT | Status: AC
  Administered 2018-04-10: 3 mL via RESPIRATORY_TRACT

## 2018-04-10 MED ORDER — IPRATROPIUM-ALBUTEROL 0.5-2.5 (3) MG/3ML IN SOLN
RESPIRATORY_TRACT | Status: AC
Start: 2018-04-10 — End: ?
  Filled 2018-04-10: qty 3

## 2018-04-10 NOTE — Discharge Instructions (Signed)
Start doxycycline and prednisone as directed.  I have refilled her albuterol inhaler. Keep hydrated, your urine should be clear to pale yellow in color. Tylenol/motrin for fever and pain. Monitor for any worsening of symptoms, chest pain, worsening shortness of breath, wheezing, go to follow up for reevaluation needed.   For sore throat/cough try using a honey-based tea. Use 3 teaspoons of honey with juice squeezed from half lemon. Place shaved pieces of ginger into 1/2-1 cup of water and warm over stove top. Then mix the ingredients and repeat every 4 hours as needed.

## 2018-04-10 NOTE — ED Provider Notes (Signed)
[name]    CSN: [id number] Arrival date & time: 04/10/18  7341     History   Chief Complaint Chief Complaint  Patient presents with  . Cough    HPI Julia Shaw is a 43 y.o. female.   43 year old female with history of allergy, asthma, bronchitis comes in for 2 week history of URI symptoms. Has had cough, congestion, wheezing, shortness of breath. Denies rhinorrhea, nasal congestion. Denies fever, chills, night sweats. States has been using albuterol with some relief. Has also been taking otc cold medicine without relief. No obvious sick contact. Never smoker.      Past Medical History:  Diagnosis Date  . Allergy   . Anemia   . Arthritis   . Asthma   . Breast lesion    Left  . Bronchitis   . Fibroid   . Hx of trichomoniasis   . Papilloma of breast    left    Patient Active Problem List   Diagnosis Date Noted  . Asthma 08/30/2015  . Allergy 08/30/2015  . Fibroid, uterus 03/03/2011    Past Surgical History:  Procedure Laterality Date  . BREAST LUMPECTOMY WITH RADIOACTIVE SEED LOCALIZATION Left 11/01/2016   Procedure: LEFT BREAST RADIOACTIVE SEED GUIDED LUMPECTOMY;  Surgeon: Jovita Kussmaul, MD;  Location: [facility];  Service: General;  Laterality: Left;  . BUNIONECTOMY     right foot  . DENTAL SURGERY    . ROBOT ASSISTED MYOMECTOMY  03/03/2011   Procedure: ROBOTIC ASSISTED MYOMECTOMY;  Surgeon: Alwyn Pea, MD;  Location: [facility];  Service: Gynecology;  Laterality: N/A;  . TUBAL LIGATION      OB History    Gravida  3   Para  2   Term  2   Preterm      AB  1   Living  2     SAB  1   TAB      Ectopic      Multiple      Live Births  2            Home Medications    Prior to Admission medications   Medication Sig Start Date End Date Taking? Authorizing Provider  albuterol (PROVENTIL HFA;VENTOLIN HFA) 108 (90 Base) MCG/ACT inhaler Inhale 1-2 puffs into the lungs every 6 (six) hours as needed for wheezing or  shortness of breath. 04/10/18   Tasia Catchings,  V, PA-C  azithromycin (ZITHROMAX) 250 MG tablet Take 1 tablet (250 mg total) by mouth daily. Take first 2 tablets together, then 1 every day until finished. Patient not taking: Reported on 04/10/2018 10/22/17   Wieters, Madelynn Done C, PA-C  diclofenac (VOLTAREN) 75 MG EC tablet Take 1 tablet (75 mg total) by mouth 2 (two) times daily. Patient not taking: Reported on 04/10/2018 08/30/15   Mancel Bale, PA-C  doxycycline (VIBRAMYCIN) 100 MG capsule Take 1 capsule (100 mg total) by mouth 2 (two) times daily. 04/10/18   Ok Edwards, PA-C  DULERA 100-5 MCG/ACT AERO  08/14/15   [provider]  fluticasone (FLONASE) 50 MCG/ACT nasal spray Place 1-2 sprays into both nostrils daily for 7 days. 10/22/17 10/29/17  Wieters, Hallie C, PA-C  HYDROcodone-acetaminophen (NORCO/VICODIN) 5-325 MG tablet Take 1-2 tablets by mouth every 4 (four) hours as needed for moderate pain or severe pain. Patient not taking: Reported on 04/10/2018 11/01/16   Autumn Messing III, MD  metroNIDAZOLE (METROGEL VAGINAL) 0.75 % vaginal gel Place 1 Applicatorful vaginally 2 (  two) times daily. Patient not taking: Reported on 04/10/2018 06/18/17   Jorje Guild, NP  montelukast (SINGULAIR) 10 MG tablet Take 10 mg by mouth at bedtime.    [provider]  predniSONE (DELTASONE) 50 MG tablet Take 1 tablet (50 mg total) by mouth daily. 04/10/18   Ok Edwards, PA-C    Family History Family History  Problem Relation Age of Onset  . Diabetes Mother   . Hypertension Mother   . Thyroid disease Father   . Heart disease Father   . Hyperlipidemia Father   . Cancer Maternal Grandmother     Social History Social History   Tobacco Use  . Smoking status: Never Smoker  . Smokeless tobacco: Never Used  Substance Use Topics  . Alcohol use: No  . Drug use: No     Allergies   Patient has no known allergies.   Review of Systems Review of Systems  Reason unable to perform ROS: See HPI as above.      Physical Exam Triage Vital Signs ED Triage Vitals  Enc Vitals Group     BP 04/10/18 0941 120/77     Pulse Rate 04/10/18 0941 79     Resp 04/10/18 0941 18     Temp 04/10/18 0941 97.8 F (36.6 C)     Temp Source 04/10/18 0941 Oral     SpO2 04/10/18 0941 100 %     Weight 04/10/18 0943 198 lb (89.8 kg)     Height --      Head Circumference --      Peak Flow --      Pain Score 04/10/18 0942 8     Pain Loc --      Pain Edu? --      Excl. in [place]? --    No data found.  Updated Vital Signs BP 120/77 (BP Location: Right Arm)   Pulse 79   Temp 97.8 F (36.6 C) (Oral)   Resp 18   Wt 198 lb (89.8 kg)   SpO2 100%   BMI 32.95 kg/m   Physical Exam Constitutional:      General: She is not in acute distress.    Appearance: She is well-developed. She is not ill-appearing, toxic-appearing or diaphoretic.  HENT:     Head: Normocephalic and atraumatic.     Right Ear: Tympanic membrane, ear canal and external ear normal. Tympanic membrane is not erythematous or bulging.     Left Ear: Tympanic membrane, ear canal and external ear normal. Tympanic membrane is not erythematous or bulging.     Nose: Nose normal.     Right Sinus: No maxillary sinus tenderness or frontal sinus tenderness.     Left Sinus: No maxillary sinus tenderness or frontal sinus tenderness.     Mouth/Throat:     Mouth: Mucous membranes are moist.     Pharynx: Oropharynx is clear. Uvula midline.  Eyes:     Conjunctiva/sclera: Conjunctivae normal.     Pupils: Pupils are equal, round, and reactive to light.  Neck:     Musculoskeletal: Normal range of motion and neck supple.  Cardiovascular:     Rate and Rhythm: Normal rate and regular rhythm.     Heart sounds: Normal heart sounds. No murmur. No friction rub. No gallop.   Pulmonary:     Effort: Pulmonary effort is normal. No accessory muscle usage, prolonged expiration, respiratory distress or retractions.     Comments: Patient speaking in full sentences without  difficulty.  Decreased  air movement with inspiratory and expiratory wheezing.  Also has diffuse rhonchi. Skin:    General: Skin is warm and dry.  Neurological:     Mental Status: She is alert and oriented to person, place, and time.      UC Treatments / Results  Labs (all labs ordered are listed, but only abnormal results are displayed) Labs Reviewed - No data to display  EKG None  Radiology No results found.  Procedures Procedures (including critical care time)  Medications Ordered in UC Medications  ipratropium-albuterol (DUONEB) 0.5-2.5 (3) MG/3ML nebulizer solution 3 mL (3 mLs Nebulization Given 04/10/18 1016)    Initial Impression / Assessment and Plan / UC Course  I have reviewed the triage vital signs and the nursing notes.  Pertinent labs & imaging results that were available during my care of the patient were reviewed by me and considered in my medical decision making (see chart for details).    Given history and exam, will provide DuoNeb x1 and reevaluate.  Patient with improvement of symptoms after DuoNeb x1.  Much improved air movement.  Lungs clear to auscultation bilaterally without adventitious lung sounds.  Will treat for bronchitis with doxycycline and prednisone.  Refill albuterol inhaler.  Other symptomatic treatment discussed.  Push fluids.  Return precautions given.  Patient expresses understanding and agrees to plan.  Final Clinical Impressions(s) / UC Diagnoses   Final diagnoses:  Bronchitis  Mild intermittent asthma with exacerbation    ED Prescriptions    Medication Sig Dispense Auth. Provider   albuterol (PROVENTIL HFA;VENTOLIN HFA) 108 (90 Base) MCG/ACT inhaler Inhale 1-2 puffs into the lungs every 6 (six) hours as needed for wheezing or shortness of breath. 1 Inhaler ,  V, PA-C   doxycycline (VIBRAMYCIN) 100 MG capsule Take 1 capsule (100 mg total) by mouth 2 (two) times daily. 20 capsule ,  V, PA-C   predniSONE (DELTASONE) 50 MG  tablet Take 1 tablet (50 mg total) by mouth daily. 5 tablet Tobin Chad, [place] 04/10/18 1155

## 2018-04-10 NOTE — ED Triage Notes (Signed)
Pt cc cough and SOB x 2 weeks or more. Pt has been using the OTC meds and they didn't work.

## 2018-07-20 ENCOUNTER — Encounter (HOSPITAL_COMMUNITY): Payer: Self-pay | Admitting: Emergency Medicine

## 2018-07-20 ENCOUNTER — Emergency Department (HOSPITAL_COMMUNITY)
Admission: EM | Admit: 2018-07-20 | Discharge: 2018-07-20 | Disposition: A | Discharge status: Home / Self Care | Payer: Self-pay | Attending: Emergency Medicine | Admitting: Emergency Medicine

## 2018-07-20 ENCOUNTER — Other Ambulatory Visit: Payer: Self-pay

## 2018-07-20 DIAGNOSIS — J4521 Mild intermittent asthma with (acute) exacerbation: Secondary | ICD-10-CM | POA: Insufficient documentation

## 2018-07-20 DIAGNOSIS — Z79899 Other long term (current) drug therapy: Secondary | ICD-10-CM | POA: Insufficient documentation

## 2018-07-20 MED ORDER — ALBUTEROL SULFATE HFA 108 (90 BASE) MCG/ACT IN AERS
4.0000 | INHALATION_SPRAY | Freq: Once | RESPIRATORY_TRACT | Status: AC
  Administered 2018-07-20: 4 via RESPIRATORY_TRACT
  Filled 2018-07-20: qty 6.7

## 2018-07-20 MED ORDER — ALBUTEROL SULFATE HFA 108 (90 BASE) MCG/ACT IN AERS
2.0000 | INHALATION_SPRAY | RESPIRATORY_TRACT | Status: DC
Start: 2018-07-20 — End: 2018-07-21
  Administered 2018-07-20: 2 via RESPIRATORY_TRACT

## 2018-07-20 MED ORDER — PREDNISONE 20 MG PO TABS
60.0000 mg | ORAL_TABLET | Freq: Once | ORAL | Status: AC
  Administered 2018-07-20: 60 mg via ORAL
  Filled 2018-07-20: qty 3

## 2018-07-20 MED ORDER — PREDNISONE 10 MG (21) PO TBPK: ORAL_TABLET | Freq: Every day | ORAL | 0 refills | Status: DC

## 2018-07-20 NOTE — ED Provider Notes (Signed)
[facility] DEPT Provider Note   CSN: [id number] Arrival date & time: 07/20/18  1756     History   Chief Complaint Chief Complaint  Patient presents with  . Asthma    HPI Julia Shaw is a 43 y.o. female.     43 year old female with history of asthma presents with worsening cough and shortness of breath.  States that she has been using her inhaler with limited relief.  States she is currently running out of it.  No fever or chills.  No CHF or anginal symptoms.  No lower extremity edema.  Denies any dyspnea on exertion.  No recent URI symptoms.     Past Medical History:  Diagnosis Date  . Allergy   . Anemia   . Arthritis   . Asthma   . Breast lesion    Left  . Bronchitis   . Fibroid   . Hx of trichomoniasis   . Papilloma of breast    left    Patient Active Problem List   Diagnosis Date Noted  . Asthma 08/30/2015  . Allergy 08/30/2015  . Fibroid, uterus 03/03/2011    Past Surgical History:  Procedure Laterality Date  . BREAST LUMPECTOMY WITH RADIOACTIVE SEED LOCALIZATION Left 11/01/2016   Procedure: LEFT BREAST RADIOACTIVE SEED GUIDED LUMPECTOMY;  Surgeon: Jovita Kussmaul, MD;  Location: [facility];  Service: General;  Laterality: Left;  . BUNIONECTOMY     right foot  . DENTAL SURGERY    . ROBOT ASSISTED MYOMECTOMY  03/03/2011   Procedure: ROBOTIC ASSISTED MYOMECTOMY;  Surgeon: Alwyn Pea, MD;  Location: [facility];  Service: Gynecology;  Laterality: N/A;  . TUBAL LIGATION       OB History    Gravida  3   Para  2   Term  2   Preterm      AB  1   Living  2     SAB  1   TAB      Ectopic      Multiple      Live Births  2            Home Medications    Prior to Admission medications   Medication Sig Start Date End Date Taking? Authorizing Provider  albuterol (PROVENTIL HFA;VENTOLIN HFA) 108 (90 Base) MCG/ACT inhaler Inhale 1-2 puffs into the lungs every 6 (six) hours as needed for wheezing or  shortness of breath. 04/10/18   Tasia Catchings, Amy V, PA-C  azithromycin (ZITHROMAX) 250 MG tablet Take 1 tablet (250 mg total) by mouth daily. Take first 2 tablets together, then 1 every day until finished. Patient not taking: Reported on 04/10/2018 10/22/17   Wieters, Madelynn Done C, PA-C  diclofenac (VOLTAREN) 75 MG EC tablet Take 1 tablet (75 mg total) by mouth 2 (two) times daily. Patient not taking: Reported on 04/10/2018 08/30/15   Mancel Bale, PA-C  doxycycline (VIBRAMYCIN) 100 MG capsule Take 1 capsule (100 mg total) by mouth 2 (two) times daily. 04/10/18   Ok Edwards, PA-C  DULERA 100-5 MCG/ACT AERO  08/14/15   [provider]  fluticasone (FLONASE) 50 MCG/ACT nasal spray Place 1-2 sprays into both nostrils daily for 7 days. 10/22/17 10/29/17  Wieters, Hallie C, PA-C  HYDROcodone-acetaminophen (NORCO/VICODIN) 5-325 MG tablet Take 1-2 tablets by mouth every 4 (four) hours as needed for moderate pain or severe pain. Patient not taking: Reported on 04/10/2018 11/01/16   Autumn Messing III, MD  metroNIDAZOLE (METROGEL VAGINAL) 0.75 %  vaginal gel Place 1 Applicatorful vaginally 2 (two) times daily. Patient not taking: Reported on 04/10/2018 06/18/17   Jorje Guild, NP  montelukast (SINGULAIR) 10 MG tablet Take 10 mg by mouth at bedtime.    [provider]  predniSONE (DELTASONE) 50 MG tablet Take 1 tablet (50 mg total) by mouth daily. 04/10/18   Ok Edwards, PA-C    Family History Family History  Problem Relation Age of Onset  . Diabetes Mother   . Hypertension Mother   . Thyroid disease Father   . Heart disease Father   . Hyperlipidemia Father   . Cancer Maternal Grandmother     Social History Social History   Tobacco Use  . Smoking status: Never Smoker  . Smokeless tobacco: Never Used  Substance Use Topics  . Alcohol use: No  . Drug use: No     Allergies   Patient has no known allergies.   Review of Systems Review of Systems  All other systems reviewed and are negative.    Physical  Exam Updated Vital Signs BP 128/86 (BP Location: Left Arm)   Pulse 94   Temp 98.7 F (37.1 C) (Oral)   Resp 20   Ht 1.524 m (5')   Wt 89.8 kg   SpO2 100%   BMI 38.67 kg/m   Physical Exam Vitals signs and nursing note reviewed.  Constitutional:      General: She is not in acute distress.    Appearance: Normal appearance. She is well-developed. She is not toxic-appearing.  HENT:     Head: Normocephalic and atraumatic.  Eyes:     General: Lids are normal.     Conjunctiva/sclera: Conjunctivae normal.     Pupils: Pupils are equal, round, and reactive to light.  Neck:     Musculoskeletal: Normal range of motion and neck supple.     Thyroid: No thyroid mass.     Trachea: No tracheal deviation.  Cardiovascular:     Rate and Rhythm: Normal rate and regular rhythm.     Heart sounds: Normal heart sounds. No murmur. No gallop.   Pulmonary:     Effort: Pulmonary effort is normal. No respiratory distress.     Breath sounds: No stridor. Examination of the right-upper field reveals decreased breath sounds and wheezing. Examination of the left-upper field reveals decreased breath sounds and wheezing. Decreased breath sounds and wheezing present. No rhonchi or rales.  Abdominal:     General: Bowel sounds are normal. There is no distension.     Palpations: Abdomen is soft.     Tenderness: There is no abdominal tenderness. There is no rebound.  Musculoskeletal: Normal range of motion.        General: No tenderness.  Skin:    General: Skin is warm and dry.     Findings: No abrasion or rash.  Neurological:     Mental Status: She is alert and oriented to person, place, and time.     GCS: GCS eye subscore is 4. GCS verbal subscore is 5. GCS motor subscore is 6.     Cranial Nerves: No cranial nerve deficit.     Sensory: No sensory deficit.  Psychiatric:        Speech: Speech normal.        Behavior: Behavior normal.      ED Treatments / Results  Labs (all labs ordered are listed, but  only abnormal results are displayed) Labs Reviewed - No data to display  EKG  Radiology No results found.  Procedures Procedures (including critical care time)  Medications Ordered in ED Medications  albuterol (VENTOLIN HFA) 108 (90 Base) MCG/ACT inhaler 4 puff (has no administration in time range)  predniSONE (DELTASONE) tablet 60 mg (has no administration in time range)     Initial Impression / Assessment and Plan / ED Course  I have reviewed the triage vital signs and the nursing notes.  Pertinent labs & imaging results that were available during my care of the patient were reviewed by me and considered in my medical decision making (see chart for details).        Patient given for possible albuterol and prednisone and feels better.  Will place on prednisone taper as well as given albuterol inhaler to go home with.  Final Clinical Impressions(s) / ED Diagnoses   Final diagnoses:  None    ED Discharge Orders    None       Lacretia Leigh, MD 07/20/18 2017

## 2018-07-20 NOTE — ED Triage Notes (Signed)
Per pt, states she has been having asthma symptoms for about a week-cough as well-states she is almost out of her inhaler-has not been around anyone who has been COVID pos

## 2018-08-10 ENCOUNTER — Encounter (HOSPITAL_COMMUNITY): Payer: Self-pay

## 2018-08-10 ENCOUNTER — Other Ambulatory Visit: Payer: Self-pay

## 2018-08-10 ENCOUNTER — Emergency Department (HOSPITAL_COMMUNITY)
Admission: EM | Admit: 2018-08-10 | Discharge: 2018-08-11 | Disposition: A | Discharge status: Home / Self Care | Payer: Self-pay | Attending: Emergency Medicine | Admitting: Emergency Medicine

## 2018-08-10 DIAGNOSIS — J45909 Unspecified asthma, uncomplicated: Secondary | ICD-10-CM | POA: Insufficient documentation

## 2018-08-10 DIAGNOSIS — M5441 Lumbago with sciatica, right side: Secondary | ICD-10-CM | POA: Insufficient documentation

## 2018-08-10 DIAGNOSIS — Z79899 Other long term (current) drug therapy: Secondary | ICD-10-CM | POA: Insufficient documentation

## 2018-08-10 NOTE — ED Triage Notes (Signed)
Pt arrived stating she has been having right hip and right leg pain that started yesterday. Denies any falls or injury. Pt ambulatory in triage. Pt states she took an aleve this morning and has similar pain in the past.

## 2018-08-11 MED ORDER — LIDOCAINE 5 % EX PTCH: 1.0000 | MEDICATED_PATCH | CUTANEOUS | 0 refills | Status: DC

## 2018-08-11 MED ORDER — PREDNISONE 10 MG (21) PO TBPK: ORAL_TABLET | ORAL | 0 refills | Status: DC

## 2018-08-11 MED ORDER — DICLOFENAC SODIUM 1 % TD GEL: 4.0000 g | Freq: Four times a day (QID) | TRANSDERMAL | 0 refills | Status: DC

## 2018-08-11 MED ORDER — IBUPROFEN 200 MG PO TABS
600.0000 mg | ORAL_TABLET | Freq: Once | ORAL | Status: AC
  Administered 2018-08-11: 600 mg via ORAL
  Filled 2018-08-11: qty 3

## 2018-08-11 MED ORDER — METHOCARBAMOL 500 MG PO TABS: 500.0000 mg | ORAL_TABLET | Freq: Two times a day (BID) | ORAL | 0 refills | Status: DC

## 2018-08-11 NOTE — ED Provider Notes (Signed)
[facility] DEPT Provider Note   CSN: [id number] Arrival date & time: 08/10/18  1913    History   Chief Complaint Chief Complaint  Patient presents with  . Hip Pain    HPI Julia Shaw is a 43 y.o. female.     HPI   Julia Shaw is a 43 y.o. female, with a history of anemia, asthma, presenting to the ED with right lower back and hip pain beginning yesterday.  Patient has had similar pain in the past.  Pain is described as a tightness and soreness radiating from the right lower back into the right posterior buttocks and behind the right leg.  She has tried using icy hot without improvement.  Denies fever/chills, falls/trauma, numbness, weakness, changes in bowel or bladder function, saddle anesthesias, abdominal pain, urinary symptoms, or any other complaints.        Past Medical History:  Diagnosis Date  . Allergy   . Anemia   . Arthritis   . Asthma   . Breast lesion    Left  . Bronchitis   . Fibroid   . Hx of trichomoniasis   . Papilloma of breast    left    Patient Active Problem List   Diagnosis Date Noted  . Asthma 08/30/2015  . Allergy 08/30/2015  . Fibroid, uterus 03/03/2011    Past Surgical History:  Procedure Laterality Date  . BREAST LUMPECTOMY WITH RADIOACTIVE SEED LOCALIZATION Left 11/01/2016   Procedure: LEFT BREAST RADIOACTIVE SEED GUIDED LUMPECTOMY;  Surgeon: Jovita Kussmaul, MD;  Location: [facility];  Service: General;  Laterality: Left;  . BUNIONECTOMY     right foot  . DENTAL SURGERY    . ROBOT ASSISTED MYOMECTOMY  03/03/2011   Procedure: ROBOTIC ASSISTED MYOMECTOMY;  Surgeon: Alwyn Pea, MD;  Location: [facility];  Service: Gynecology;  Laterality: N/A;  . TUBAL LIGATION       OB History    Gravida  3   Para  2   Term  2   Preterm      AB  1   Living  2     SAB  1   TAB      Ectopic      Multiple      Live Births  2            Home Medications    Prior to  Admission medications   Medication Sig Start Date End Date Taking? Authorizing Provider  albuterol (PROVENTIL HFA;VENTOLIN HFA) 108 (90 Base) MCG/ACT inhaler Inhale 1-2 puffs into the lungs every 6 (six) hours as needed for wheezing or shortness of breath. 04/10/18   Tasia Catchings, Amy V, PA-C  azithromycin (ZITHROMAX) 250 MG tablet Take 1 tablet (250 mg total) by mouth daily. Take first 2 tablets together, then 1 every day until finished. Patient not taking: Reported on 04/10/2018 10/22/17   Wieters, Madelynn Done C, PA-C  diclofenac (VOLTAREN) 75 MG EC tablet Take 1 tablet (75 mg total) by mouth 2 (two) times daily. Patient not taking: Reported on 04/10/2018 08/30/15   Mancel Bale, PA-C  diclofenac sodium (VOLTAREN) 1 % GEL Apply 4 g topically 4 (four) times daily. 08/11/18   Mysti Haley C, PA-C  doxycycline (VIBRAMYCIN) 100 MG capsule Take 1 capsule (100 mg total) by mouth 2 (two) times daily. 04/10/18   Ok Edwards, PA-C  DULERA 100-5 MCG/ACT AERO  08/14/15   [provider]  fluticasone Asencion Islam) 50 MCG/ACT nasal spray  Place 1-2 sprays into both nostrils daily for 7 days. 10/22/17 10/29/17  Wieters, Hallie C, PA-C  HYDROcodone-acetaminophen (NORCO/VICODIN) 5-325 MG tablet Take 1-2 tablets by mouth every 4 (four) hours as needed for moderate pain or severe pain. Patient not taking: Reported on 04/10/2018 11/01/16   Autumn Messing III, MD  lidocaine (LIDODERM) 5 % Place 1 patch onto the skin daily. Remove & Discard patch within 12 hours or as directed by MD 08/11/18   Kelita Wallis C, PA-C  methocarbamol (ROBAXIN) 500 MG tablet Take 1 tablet (500 mg total) by mouth 2 (two) times daily. 08/11/18   Lawren Sexson C, PA-C  metroNIDAZOLE (METROGEL VAGINAL) 0.75 % vaginal gel Place 1 Applicatorful vaginally 2 (two) times daily. Patient not taking: Reported on 04/10/2018 06/18/17   Jorje Guild, NP  montelukast (SINGULAIR) 10 MG tablet Take 10 mg by mouth at bedtime.    [provider]  predniSONE (STERAPRED UNI-PAK 21 TAB) 10 MG  (21) TBPK tablet Take 6 tabs (60mg ) day 1, 5 tabs (50mg ) day 2, 4 tabs (40mg ) day 3, 3 tabs (30mg ) day 4, 2 tabs (20mg ) day 5, and 1 tab (10mg ) day 6. 08/11/18   Padraig Nhan, Helane Gunther, PA-C    Family History Family History  Problem Relation Age of Onset  . Diabetes Mother   . Hypertension Mother   . Thyroid disease Father   . Heart disease Father   . Hyperlipidemia Father   . Cancer Maternal Grandmother     Social History Social History   Tobacco Use  . Smoking status: Never Smoker  . Smokeless tobacco: Never Used  Substance Use Topics  . Alcohol use: No  . Drug use: No     Allergies   Patient has no known allergies.   Review of Systems Review of Systems  Constitutional: Negative for fever.  Respiratory: Negative for shortness of breath.   Cardiovascular: Negative for chest pain.  Gastrointestinal: Negative for abdominal pain, nausea and vomiting.  Genitourinary: Negative for dysuria, flank pain, frequency and hematuria.  Musculoskeletal: Positive for arthralgias and back pain.  Neurological: Negative for weakness and numbness.  All other systems reviewed and are negative.    Physical Exam Updated Vital Signs BP 117/77 (BP Location: Left Arm)   Pulse 68   Temp 98.4 F (36.9 C) (Oral)   Resp 18   SpO2 98%   Physical Exam Vitals signs and nursing note reviewed.  Constitutional:      General: She is not in acute distress.    Appearance: She is well-developed. She is not diaphoretic.  HENT:     Head: Normocephalic and atraumatic.     Mouth/Throat:     Mouth: Mucous membranes are moist.     Pharynx: Oropharynx is clear.  Eyes:     Conjunctiva/sclera: Conjunctivae normal.  Neck:     Musculoskeletal: Neck supple.  Cardiovascular:     Rate and Rhythm: Normal rate and regular rhythm.     Pulses: Normal pulses.          Radial pulses are 2+ on the right side and 2+ on the left side.       Posterior tibial pulses are 2+ on the right side and 2+ on the left side.      Comments: Tactile temperature in the extremities appropriate and equal bilaterally. Pulmonary:     Effort: Pulmonary effort is normal. No respiratory distress.  Abdominal:     Palpations: Abdomen is soft.     Tenderness: There is  no abdominal tenderness. There is no guarding.  Musculoskeletal:     Right lower leg: No edema.     Left lower leg: No edema.     Comments: Tenderness to the right lower back and buttocks without noted swelling, deformity, or instability. No lateral or anterior tenderness to the right hip or pelvis.  Full passive range of motion in the right hip and knee.  Lymphadenopathy:     Cervical: No cervical adenopathy.  Skin:    General: Skin is warm and dry.  Neurological:     Mental Status: She is alert.     Comments: Sensation grossly intact to light touch in the lower extremities bilaterally. No saddle anesthesias. Strength 5/5 in the bilateral lower extremities. Slow, antalgic gait, but ambulatory without assistance. Coordination intact with heel to shin testing.  Psychiatric:        Mood and Affect: Mood and affect normal.        Speech: Speech normal.        Behavior: Behavior normal.      ED Treatments / Results  Labs (all labs ordered are listed, but only abnormal results are displayed) Labs Reviewed - No data to display  EKG None  Radiology No results found.  Procedures Procedures (including critical care time)  Medications Ordered in ED Medications  ibuprofen (ADVIL) tablet 600 mg (has no administration in time range)     Initial Impression / Assessment and Plan / ED Course  I have reviewed the triage vital signs and the nursing notes.  Pertinent labs & imaging results that were available during my care of the patient were reviewed by me and considered in my medical decision making (see chart for details).        Patient presents with lower back and right buttock pain that she states she has experienced before.  No evidence of  neurovascular compromise.  Low suspicion for neurosurgical emergency, such as cauda equina, based on physical exam findings and patient account of the pain and lack of suggestive symptoms. The patient was given instructions for home care as well as return precautions. Patient voices understanding of these instructions, accepts the plan, and is comfortable with discharge.  Final Clinical Impressions(s) / ED Diagnoses   Final diagnoses:  Acute right-sided low back pain with right-sided sciatica    ED Discharge Orders         Ordered    methocarbamol (ROBAXIN) 500 MG tablet  2 times daily     08/11/18 0028    lidocaine (LIDODERM) 5 %  Every 24 hours     08/11/18 0028    diclofenac sodium (VOLTAREN) 1 % GEL  4 times daily     08/11/18 0028    predniSONE (STERAPRED UNI-PAK 21 TAB) 10 MG (21) TBPK tablet     08/11/18 0028           JoyHelane Gunther, PA-C 08/11/18 0029    Orpah Greek, MD 08/11/18 [phone]

## 2018-08-11 NOTE — Discharge Instructions (Addendum)
Take it easy, but do not lay around too much as this may make any stiffness worse.  Antiinflammatory medications: Take 600 mg of ibuprofen every 6 hours or 440 mg (over the counter dose) to 500 mg (prescription dose) of naproxen every 12 hours for the next 3 days. After this time, these medications may be used as needed for pain. Take these medications with food to avoid upset stomach. Choose only one of these medications, do not take them together. Acetaminophen (generic for Tylenol): Should you continue to have additional pain while taking the ibuprofen or naproxen, you may add in acetaminophen as needed. Your daily total maximum amount of acetaminophen from all sources should be limited to 4000mg /day for persons without liver problems, or 2000mg /day for those with liver problems. Diclofenac gel: This is a topical anti-inflammatory medication and can be applied directly to the painful region.  Do not use on the face or genitals.  This medication may be used as an alternative to oral anti-inflammatory medications, such as ibuprofen or naproxen. Methocarbamol: Methocarbamol (generic for Robaxin) is a muscle relaxer and can help relieve stiff muscles or muscle spasms.  Do not drive or perform other dangerous activities while taking this medication as it can cause drowsiness as well as changes in reaction time and judgement. Prednisone: Take the prednisone, as prescribed, until finished. Lidocaine patches: These are available via either prescription or over-the-counter. The over-the-counter option may be more economical one and are likely just as effective. There are multiple over-the-counter brands, such as Salonpas. Exercises: Be sure to perform the attached exercises starting with three times a week and working up to performing them daily. This is an essential part of preventing long term problems.  Follow up: Follow up with a primary care provider for any future management of these complaints. Be sure to  follow up within 7-10 days. Return: Return to the ED should symptoms worsen.  For prescription assistance, may try using prescription discount sites or apps, such as goodrx.com

## 2018-09-30 ENCOUNTER — Emergency Department (HOSPITAL_COMMUNITY): Payer: Medicare Other

## 2018-09-30 ENCOUNTER — Other Ambulatory Visit: Payer: Self-pay

## 2018-09-30 ENCOUNTER — Encounter (HOSPITAL_COMMUNITY): Payer: Self-pay | Admitting: Emergency Medicine

## 2018-09-30 DIAGNOSIS — X58XXXA Exposure to other specified factors, initial encounter: Secondary | ICD-10-CM | POA: Insufficient documentation

## 2018-09-30 DIAGNOSIS — S73102A Unspecified sprain of left hip, initial encounter: Secondary | ICD-10-CM | POA: Insufficient documentation

## 2018-09-30 DIAGNOSIS — J45909 Unspecified asthma, uncomplicated: Secondary | ICD-10-CM | POA: Insufficient documentation

## 2018-09-30 DIAGNOSIS — Z79899 Other long term (current) drug therapy: Secondary | ICD-10-CM | POA: Diagnosis not present

## 2018-09-30 DIAGNOSIS — Y939 Activity, unspecified: Secondary | ICD-10-CM | POA: Diagnosis not present

## 2018-09-30 DIAGNOSIS — Y929 Unspecified place or not applicable: Secondary | ICD-10-CM | POA: Insufficient documentation

## 2018-09-30 DIAGNOSIS — Y999 Unspecified external cause status: Secondary | ICD-10-CM | POA: Diagnosis not present

## 2018-09-30 DIAGNOSIS — S79912A Unspecified injury of left hip, initial encounter: Secondary | ICD-10-CM | POA: Diagnosis present

## 2018-09-30 LAB — I-STAT BETA HCG BLOOD, ED (NOT ORDERABLE): I-stat hCG, quantitative: <5 m[IU]/mL (ref <5)

## 2018-09-30 NOTE — ED Triage Notes (Signed)
Pt reports having left hip pain with radiation down leg since yesterday. Pt denies any injury.

## 2018-10-01 ENCOUNTER — Emergency Department (HOSPITAL_COMMUNITY)
Admission: EM | Admit: 2018-10-01 | Discharge: 2018-10-01 | Disposition: A | Discharge status: Home / Self Care | Payer: Medicare Other | Attending: Emergency Medicine | Admitting: Emergency Medicine

## 2018-10-01 ENCOUNTER — Emergency Department (HOSPITAL_COMMUNITY): Payer: Medicare Other

## 2018-10-01 DIAGNOSIS — S76012A Strain of muscle, fascia and tendon of left hip, initial encounter: Secondary | ICD-10-CM

## 2018-10-01 MED ORDER — CYCLOBENZAPRINE HCL 10 MG PO TABS: 10.0000 mg | ORAL_TABLET | Freq: Two times a day (BID) | ORAL | 0 refills | Status: DC | PRN

## 2018-10-01 MED ORDER — IBUPROFEN 600 MG PO TABS: 600.0000 mg | ORAL_TABLET | Freq: Four times a day (QID) | ORAL | 0 refills | Status: DC | PRN

## 2018-10-01 MED ORDER — KETOROLAC TROMETHAMINE 30 MG/ML IJ SOLN
30.0000 mg | Freq: Once | INTRAMUSCULAR | Status: AC
  Administered 2018-10-01: 30 mg via INTRAMUSCULAR
  Filled 2018-10-01: qty 1

## 2018-10-01 NOTE — ED Provider Notes (Signed)
[facility] DEPT Provider Note   CSN: [id number] Arrival date & time: 09/30/18  2204     History   Chief Complaint Chief Complaint  Patient presents with   Hip Pain    HPI Julia Shaw is a 43 y.o. female.     The history is provided by the patient. No language interpreter was used.  Hip Pain     43 year old female presenting for evaluation of hip pain.  Patient reports since yesterday she developed pain about her left hip.  She described pain as a sharp and shooting sensation starting in her upper buttock and radiates to her mid thigh worse with ambulation.  Pain is moderate in severity and rates a 6 out of 10.  Pain is minimal at this time.  No associated back pain, fever or chills abdominal pain bowel bladder incontinence or saddle anesthesia.  She denies any recent injury.  She denies any numbness or focal weakness.  Past Medical History:  Diagnosis Date   Allergy    Anemia    Arthritis    Asthma    Breast lesion    Left   Bronchitis    Fibroid    Hx of trichomoniasis    Papilloma of breast    left    Patient Active Problem List   Diagnosis Date Noted   Asthma 08/30/2015   Allergy 08/30/2015   Fibroid, uterus 03/03/2011    Past Surgical History:  Procedure Laterality Date   BREAST LUMPECTOMY WITH RADIOACTIVE SEED LOCALIZATION Left 11/01/2016   Procedure: LEFT BREAST RADIOACTIVE SEED GUIDED LUMPECTOMY;  Surgeon: Jovita Kussmaul, MD;  Location: [facility];  Service: General;  Laterality: Left;   BUNIONECTOMY     right foot   DENTAL SURGERY     ROBOT ASSISTED MYOMECTOMY  03/03/2011   Procedure: ROBOTIC ASSISTED MYOMECTOMY;  Surgeon: Alwyn Pea, MD;  Location: [facility];  Service: Gynecology;  Laterality: N/A;   TUBAL LIGATION       OB History    Gravida  3   Para  2   Term  2   Preterm      AB  1   Living  2     SAB  1   TAB      Ectopic      Multiple      Live Births  2             Home Medications    Prior to Admission medications   Medication Sig Start Date End Date Taking? Authorizing Provider  albuterol (PROVENTIL HFA;VENTOLIN HFA) 108 (90 Base) MCG/ACT inhaler Inhale 1-2 puffs into the lungs every 6 (six) hours as needed for wheezing or shortness of breath. 04/10/18  Yes Yu, Amy V, PA-C  DULERA 100-5 MCG/ACT AERO Inhale 2 puffs into the lungs every 6 (six) hours as needed for wheezing.  08/14/15  Yes [provider]  azithromycin (ZITHROMAX) 250 MG tablet Take 1 tablet (250 mg total) by mouth daily. Take first 2 tablets together, then 1 every day until finished. Patient not taking: Reported on 04/10/2018 10/22/17   Wieters, Madelynn Done C, PA-C  diclofenac (VOLTAREN) 75 MG EC tablet Take 1 tablet (75 mg total) by mouth 2 (two) times daily. Patient not taking: Reported on 04/10/2018 08/30/15   Mancel Bale, PA-C  diclofenac sodium (VOLTAREN) 1 % GEL Apply 4 g topically 4 (four) times daily. Patient not taking: Reported on 09/30/2018 08/11/18   Lorayne Bender,  PA-C  doxycycline (VIBRAMYCIN) 100 MG capsule Take 1 capsule (100 mg total) by mouth 2 (two) times daily. Patient not taking: Reported on 09/30/2018 04/10/18   Ok Edwards, PA-C  fluticasone [facility]) 50 MCG/ACT nasal spray Place 1-2 sprays into both nostrils daily for 7 days. Patient not taking: Reported on 09/30/2018 10/22/17 10/29/17  Wieters, Elesa Hacker, PA-C  HYDROcodone-acetaminophen (NORCO/VICODIN) 5-325 MG tablet Take 1-2 tablets by mouth every 4 (four) hours as needed for moderate pain or severe pain. Patient not taking: Reported on 09/30/2018 11/01/16   Autumn Messing III, MD  lidocaine (LIDODERM) 5 % Place 1 patch onto the skin daily. Remove & Discard patch within 12 hours or as directed by MD Patient not taking: Reported on 09/30/2018 08/11/18   Joy, Raquel Sarna C, PA-C  methocarbamol (ROBAXIN) 500 MG tablet Take 1 tablet (500 mg total) by mouth 2 (two) times daily. Patient not taking: Reported on 09/30/2018 08/11/18   Joy,  Raquel Sarna C, PA-C  metroNIDAZOLE (METROGEL VAGINAL) 0.75 % vaginal gel Place 1 Applicatorful vaginally 2 (two) times daily. Patient not taking: Reported on 09/30/2018 06/18/17   Jorje Guild, NP  predniSONE (STERAPRED UNI-PAK 21 TAB) 10 MG (21) TBPK tablet Take 6 tabs (60mg ) day 1, 5 tabs (50mg ) day 2, 4 tabs (40mg ) day 3, 3 tabs (30mg ) day 4, 2 tabs (20mg ) day 5, and 1 tab (10mg ) day 6. Patient not taking: Reported on 09/30/2018 08/11/18   Lorayne Bender, PA-C    Family History Family History  Problem Relation Age of Onset   Diabetes Mother    Hypertension Mother    Thyroid disease Father    Heart disease Father    Hyperlipidemia Father    Cancer Maternal Grandmother     Social History Social History   Tobacco Use   Smoking status: Never Smoker   Smokeless tobacco: Never Used  Substance Use Topics   Alcohol use: No   Drug use: No     Allergies   Patient has no known allergies.   Review of Systems Review of Systems  Constitutional: Negative for fever.  Musculoskeletal: Positive for myalgias.  Skin: Negative for rash and wound.  Neurological: Negative for numbness.     Physical Exam Updated Vital Signs BP 120/68 (BP Location: Left Arm)    Pulse (!) 101    Temp 98.8 F (37.1 C) (Oral)    Resp 16    Ht 5' (1.524 m)    Wt 89.8 kg    LMP 09/07/2018    SpO2 100%    BMI 38.67 kg/m   Physical Exam Vitals signs and nursing note reviewed.  Constitutional:      General: She is not in acute distress.    Appearance: She is well-developed. She is obese.  HENT:     Head: Atraumatic.  Eyes:     Conjunctiva/sclera: Conjunctivae normal.  Neck:     Musculoskeletal: Neck supple.  Musculoskeletal:        General: Tenderness (Left hip: Tenderness to left lumbosacral region extending towards left mid thigh on palpation with normal hip flexion extension abduction and abduction.  No overlying skin changes.  No midline spine tenderness.) present.     Comments: Negative straight  leg raise.  Skin:    Findings: No rash.  Neurological:     Mental Status: She is alert.      ED Treatments / Results  Labs (all labs ordered are listed, but only abnormal results are displayed) Labs Reviewed  I-STAT BETA  HCG BLOOD, ED ([facility], [facility], [facility] ONLY)  I-STAT BETA HCG BLOOD, ED (NOT ORDERABLE)    EKG None  Radiology Dg Hip Unilat With Pelvis 2-3 Views Left  Result Date: 10/01/2018 CLINICAL DATA:  43 year old female with left hip pain. No known injury. EXAM: DG HIP (WITH OR WITHOUT PELVIS) 2-3V LEFT COMPARISON:  None. FINDINGS: There is no acute fracture or dislocation. The bones are well mineralized. Minimal arthritic changes. The soft tissues are unremarkable. IMPRESSION: No acute fracture or dislocation. Electronically Signed   By: Anner Crete M.D.   On: 10/01/2018 00:43    Procedures Procedures (including critical care time)  Medications Ordered in ED Medications  ketorolac (TORADOL) 30 MG/ML injection 30 mg (has no administration in time range)     Initial Impression / Assessment and Plan / ED Course  I have reviewed the triage vital signs and the nursing notes.  Pertinent labs & imaging results that were available during my care of the patient were reviewed by me and considered in my medical decision making (see chart for details).        BP (!) 105/57 (BP Location: Left Arm)    Pulse 92    Temp 98.8 F (37.1 C) (Oral)    Resp 18    Ht 5' (1.524 m)    Wt 89.8 kg    LMP 09/07/2018    SpO2 99%    BMI 38.67 kg/m    Final Clinical Impressions(s) / ED Diagnoses   Final diagnoses:  None    ED Discharge Orders    None     1:12 AM Atraumatic left hip pain likely muscle skeletal pain.  No evidence of infection.  X-ray left hip unremarkable.  Pregnancy test negative.  Will treat with ibuprofen and Flexeril.  Return precaution discussed.  Patient is neurovascular intact.   Domenic Moras, PA-C 10/05/18 [id number]    Fatima Blank, MD 10/10/18  [phone]

## 2019-05-04 ENCOUNTER — Other Ambulatory Visit: Payer: Self-pay

## 2019-05-04 ENCOUNTER — Emergency Department (HOSPITAL_COMMUNITY)
Admission: EM | Admit: 2019-05-04 | Discharge: 2019-05-04 | Disposition: A | Discharge status: Home / Self Care | Payer: Medicare Other | Attending: Emergency Medicine | Admitting: Emergency Medicine

## 2019-05-04 ENCOUNTER — Encounter (HOSPITAL_COMMUNITY): Payer: Self-pay | Admitting: *Deleted

## 2019-05-04 DIAGNOSIS — J302 Other seasonal allergic rhinitis: Secondary | ICD-10-CM | POA: Diagnosis not present

## 2019-05-04 DIAGNOSIS — R0981 Nasal congestion: Secondary | ICD-10-CM | POA: Diagnosis present

## 2019-05-04 MED ORDER — FLUTICASONE PROPIONATE 50 MCG/ACT NA SUSP: 2.0000 | Freq: Every day | NASAL | 0 refills | Status: DC

## 2019-05-04 MED ORDER — CETIRIZINE HCL 10 MG PO TABS: 10.0000 mg | ORAL_TABLET | Freq: Every day | ORAL | 0 refills | Status: DC

## 2019-05-04 NOTE — ED Provider Notes (Signed)
[facility] DEPT Provider Note   CSN: [id number] Arrival date & time: 05/04/19  1452     History Chief Complaint  Patient presents with  . Allergies    Julia Shaw is a 44 y.o. female.  Julia Shaw is a 44 y.o. female with a history of asthma and allergies, who presents to the ED for evaluation of 1 week of nasal congestion, sneezing and watery eyes.  Patient states symptoms have persisted throughout the week.  She denies any associated headache, sore throat, cough, fevers or chills, no body aches, chest pain or shortness of breath.  She states she gets these symptoms similarly around this time each year.  She has tried 1 dose of Benadryl with some relief but has not taken any other medications to treat the symptoms.  Denies any known sick contacts.        Past Medical History:  Diagnosis Date  . Allergy   . Anemia   . Arthritis   . Asthma   . Breast lesion    Left  . Bronchitis   . Fibroid   . Hx of trichomoniasis   . Papilloma of breast    left    Patient Active Problem List   Diagnosis Date Noted  . Asthma 08/30/2015  . Allergy 08/30/2015  . Fibroid, uterus 03/03/2011    Past Surgical History:  Procedure Laterality Date  . BREAST LUMPECTOMY WITH RADIOACTIVE SEED LOCALIZATION Left 11/01/2016   Procedure: LEFT BREAST RADIOACTIVE SEED GUIDED LUMPECTOMY;  Surgeon: Jovita Kussmaul, MD;  Location: [facility];  Service: General;  Laterality: Left;  . BUNIONECTOMY     right foot  . DENTAL SURGERY    . ROBOT ASSISTED MYOMECTOMY  03/03/2011   Procedure: ROBOTIC ASSISTED MYOMECTOMY;  Surgeon: Alwyn Pea, MD;  Location: [facility];  Service: Gynecology;  Laterality: N/A;  . TUBAL LIGATION       OB History    Gravida  3   Para  2   Term  2   Preterm      AB  1   Living  2     SAB  1   TAB      Ectopic      Multiple      Live Births  2           Family History  Problem Relation Age of Onset  .  Diabetes Mother   . Hypertension Mother   . Thyroid disease Father   . Heart disease Father   . Hyperlipidemia Father   . Cancer Maternal Grandmother     Social History   Tobacco Use  . Smoking status: Never Smoker  . Smokeless tobacco: Never Used  Substance Use Topics  . Alcohol use: No  . Drug use: No    Home Medications Prior to Admission medications   Medication Sig Start Date End Date Taking? Authorizing Provider  albuterol (PROVENTIL HFA;VENTOLIN HFA) 108 (90 Base) MCG/ACT inhaler Inhale 1-2 puffs into the lungs every 6 (six) hours as needed for wheezing or shortness of breath. 04/10/18   Tasia Catchings, Amy V, PA-C  cetirizine (ZYRTEC ALLERGY) 10 MG tablet Take 1 tablet (10 mg total) by mouth daily. 05/04/19   Jacqlyn Larsen, PA-C  cyclobenzaprine (FLEXERIL) 10 MG tablet Take 1 tablet (10 mg total) by mouth 2 (two) times daily as needed for muscle spasms. 10/01/18   Domenic Moras, PA-C  DULERA 100-5 MCG/ACT AERO Inhale 2 puffs into the  lungs every 6 (six) hours as needed for wheezing.  08/14/15   [provider]  fluticasone (FLONASE) 50 MCG/ACT nasal spray Place 2 sprays into both nostrils daily. 05/04/19   Jacqlyn Larsen, PA-C  ibuprofen (ADVIL) 600 MG tablet Take 1 tablet (600 mg total) by mouth every 6 (six) hours as needed. 10/01/18   Domenic Moras, PA-C    Allergies    Patient has no known allergies.  Review of Systems   Review of Systems  Constitutional: Negative for chills and fever.  HENT: Positive for congestion, rhinorrhea and sneezing. Negative for sinus pressure, sinus pain and sore throat.   Respiratory: Negative for cough and shortness of breath.   Cardiovascular: Negative for chest pain.  Gastrointestinal: Negative for abdominal pain, diarrhea and vomiting.  Musculoskeletal: Negative for myalgias.  Neurological: Negative for headaches.    Physical Exam Updated Vital Signs BP (!) 125/97 (BP Location: Left Arm)   Pulse 78   Temp 98.7 F (37.1 C) (Oral)   Resp 16    Ht 5' (1.524 m)   Wt 89.8 kg   SpO2 99%   BMI 38.67 kg/m   Physical Exam Vitals and nursing note reviewed.  Constitutional:      General: She is not in acute distress.    Appearance: She is well-developed. She is not ill-appearing or diaphoretic.  HENT:     Head: Normocephalic and atraumatic.     Nose: Congestion and rhinorrhea present.     Comments: Nasal congestion noted with clear rhinorrhea present, no pain over sinuses    Mouth/Throat:     Comments: Posterior oropharynx clear and moist without erythema, edema or exudates Eyes:     General:        Right eye: No discharge.        Left eye: No discharge.  Neck:     Comments: No rigidity Cardiovascular:     Rate and Rhythm: Normal rate and regular rhythm.     Heart sounds: Normal heart sounds.  Pulmonary:     Effort: Pulmonary effort is normal. No respiratory distress.     Breath sounds: Normal breath sounds.     Comments: Respirations equal and unlabored, patient able to speak in full sentences, lungs clear to auscultation bilaterally Abdominal:     General: Bowel sounds are normal. There is no distension.     Palpations: Abdomen is soft. There is no mass.     Tenderness: There is no abdominal tenderness. There is no guarding.     Comments: Abdomen soft, nondistended, nontender to palpation in all quadrants without guarding or peritoneal signs  Musculoskeletal:        General: No deformity.     Cervical back: Neck supple.  Lymphadenopathy:     Cervical: No cervical adenopathy.  Skin:    General: Skin is warm and dry.     Capillary Refill: Capillary refill takes less than 2 seconds.  Neurological:     Mental Status: She is alert.  Psychiatric:        Mood and Affect: Mood normal.        Behavior: Behavior normal.     ED Results / Procedures / Treatments   Labs (all labs ordered are listed, but only abnormal results are displayed) Labs Reviewed - No data to display  EKG None  Radiology No results  found.  Procedures Procedures (including critical care time)  Medications Ordered in ED Medications - No data to display  ED Course  I  have reviewed the triage vital signs and the nursing notes.  Pertinent labs & imaging results that were available during my care of the patient were reviewed by me and considered in my medical decision making (see chart for details).    MDM Rules/Calculators/A&P                      44 year old female presents with watering eyes, nasal congestion and sneezing over the past week, gets similar symptoms each year in the spring.  She has no associated cough, fever, sore throat, has normal vitals and is well-appearing on exam.  Suspect this is seasonal allergies.  Will treat with Zyrtec and Flonase.  Patient has no known Covid contacts and does not wish to have Covid testing performed today.  Discussed PCP follow-up and return precautions.  Patient expresses understanding and agreement.  Discharged home in good condition.  Final Clinical Impression(s) / ED Diagnoses Final diagnoses:  Seasonal allergies    Rx / DC Orders ED Discharge Orders         Ordered    cetirizine (ZYRTEC ALLERGY) 10 MG tablet  Daily     05/04/19 1600    fluticasone (FLONASE) 50 MCG/ACT nasal spray  Daily     05/04/19 1600           Jacqlyn Larsen, [place] 05/04/19 1701    Sherwood Gambler, MD 05/05/19 [phone]

## 2019-05-04 NOTE — Discharge Instructions (Signed)
Use Zyrtec and Flonase daily to treat your symptoms, it will take a few days for this to start helping.  Your symptoms seem very much consistent with allergies.  Follow-up with your PCP.

## 2019-05-04 NOTE — ED Triage Notes (Signed)
Watery eyes and slight congestion started this week.

## 2019-05-20 ENCOUNTER — Other Ambulatory Visit: Payer: Self-pay

## 2019-05-20 ENCOUNTER — Ambulatory Visit (HOSPITAL_COMMUNITY)
Admission: EM | Admit: 2019-05-20 | Discharge: 2019-05-20 | Disposition: A | Discharge status: Home / Self Care | Payer: Medicare Other | Attending: Family Medicine | Admitting: Family Medicine

## 2019-05-20 ENCOUNTER — Encounter (HOSPITAL_COMMUNITY): Payer: Self-pay

## 2019-05-20 DIAGNOSIS — J4521 Mild intermittent asthma with (acute) exacerbation: Secondary | ICD-10-CM | POA: Insufficient documentation

## 2019-05-20 DIAGNOSIS — Z20822 Contact with and (suspected) exposure to covid-19: Secondary | ICD-10-CM | POA: Diagnosis not present

## 2019-05-20 MED ORDER — ALBUTEROL SULFATE HFA 108 (90 BASE) MCG/ACT IN AERS
INHALATION_SPRAY | RESPIRATORY_TRACT | Status: AC
  Filled 2019-05-20: qty 6.7

## 2019-05-20 MED ORDER — ALBUTEROL SULFATE HFA 108 (90 BASE) MCG/ACT IN AERS
2.0000 | INHALATION_SPRAY | Freq: Once | RESPIRATORY_TRACT | Status: AC
  Administered 2019-05-20: 2 via RESPIRATORY_TRACT

## 2019-05-20 MED ORDER — PREDNISONE 50 MG PO TABS: 50.0000 mg | ORAL_TABLET | Freq: Every day | ORAL | 0 refills | Status: DC

## 2019-05-20 NOTE — ED Provider Notes (Signed)
[place]    CSN: [id number] Arrival date & time: 05/20/19  1434      History   Chief Complaint Chief Complaint  Patient presents with  . Exposure to covid 19    HPI Julia Shaw is a 44 y.o. female history of asthma, allergic rhinitis presenting today for Covid testing.  Patient notes that her partner recently tested positive for Covid.  She currently denies any symptoms of Covid.  She reports that a couple of weeks ago she was having sore throat, decreased taste/smell, nausea and diarrhea.  The symptoms have resolved.  She does continue to have some nasal congestion and sore throat which she has attributed to allergies.  She has been wheezing and has felt her asthma flaring.  Does not have an inhaler.  HPI  Past Medical History:  Diagnosis Date  . Allergy   . Anemia   . Arthritis   . Asthma   . Breast lesion    Left  . Bronchitis   . Fibroid   . Hx of trichomoniasis   . Papilloma of breast    left    Patient Active Problem List   Diagnosis Date Noted  . Asthma 08/30/2015  . Allergy 08/30/2015  . Fibroid, uterus 03/03/2011    Past Surgical History:  Procedure Laterality Date  . BREAST LUMPECTOMY WITH RADIOACTIVE SEED LOCALIZATION Left 11/01/2016   Procedure: LEFT BREAST RADIOACTIVE SEED GUIDED LUMPECTOMY;  Surgeon: Jovita Kussmaul, MD;  Location: [facility];  Service: General;  Laterality: Left;  . BUNIONECTOMY     right foot  . DENTAL SURGERY    . ROBOT ASSISTED MYOMECTOMY  03/03/2011   Procedure: ROBOTIC ASSISTED MYOMECTOMY;  Surgeon: Alwyn Pea, MD;  Location: [facility];  Service: Gynecology;  Laterality: N/A;  . TUBAL LIGATION      OB History    Gravida  3   Para  2   Term  2   Preterm      AB  1   Living  2     SAB  1   TAB      Ectopic      Multiple      Live Births  2            Home Medications    Prior to Admission medications   Medication Sig Start Date End Date Taking? Authorizing Provider  albuterol  (PROVENTIL HFA;VENTOLIN HFA) 108 (90 Base) MCG/ACT inhaler Inhale 1-2 puffs into the lungs every 6 (six) hours as needed for wheezing or shortness of breath. 04/10/18   Tasia Catchings, Amy V, PA-C  cetirizine (ZYRTEC ALLERGY) 10 MG tablet Take 1 tablet (10 mg total) by mouth daily. 05/04/19   Jacqlyn Larsen, PA-C  cyclobenzaprine (FLEXERIL) 10 MG tablet Take 1 tablet (10 mg total) by mouth 2 (two) times daily as needed for muscle spasms. 10/01/18   Domenic Moras, PA-C  DULERA 100-5 MCG/ACT AERO Inhale 2 puffs into the lungs every 6 (six) hours as needed for wheezing.  08/14/15   [provider]  fluticasone (FLONASE) 50 MCG/ACT nasal spray Place 2 sprays into both nostrils daily. 05/04/19   Jacqlyn Larsen, PA-C  ibuprofen (ADVIL) 600 MG tablet Take 1 tablet (600 mg total) by mouth every 6 (six) hours as needed. 10/01/18   Domenic Moras, PA-C  predniSONE (DELTASONE) 50 MG tablet Take 1 tablet (50 mg total) by mouth daily with breakfast. 05/20/19   Katlynn Naser, Elesa Hacker, PA-C  Family History Family History  Problem Relation Age of Onset  . Diabetes Mother   . Hypertension Mother   . Thyroid disease Father   . Heart disease Father   . Hyperlipidemia Father   . Cancer Maternal Grandmother     Social History Social History   Tobacco Use  . Smoking status: Never Smoker  . Smokeless tobacco: Never Used  Substance Use Topics  . Alcohol use: No  . Drug use: No     Allergies   Patient has no known allergies.   Review of Systems Review of Systems  Constitutional: Negative for activity change, appetite change, chills, fatigue and fever.  HENT: Positive for rhinorrhea and sore throat. Negative for congestion, ear pain, sinus pressure and trouble swallowing.   Eyes: Negative for discharge and redness.  Respiratory: Positive for chest tightness, shortness of breath and wheezing. Negative for cough.   Cardiovascular: Negative for chest pain.  Gastrointestinal: Negative for abdominal pain, diarrhea, nausea  and vomiting.  Musculoskeletal: Negative for myalgias.  Skin: Negative for rash.  Neurological: Negative for dizziness, light-headedness and headaches.     Physical Exam Triage Vital Signs ED Triage Vitals  Enc Vitals Group     BP 05/20/19 1512 (!) 141/76     Pulse Rate 05/20/19 1512 (!) 101     Resp 05/20/19 1512 16     Temp 05/20/19 1512 98.5 F (36.9 C)     Temp Source 05/20/19 1512 Oral     SpO2 05/20/19 1512 100 %     Weight --      Height --      Head Circumference --      Peak Flow --      Pain Score 05/20/19 1524 0     Pain Loc --      Pain Edu? --      Excl. in [place]? --    No data found.  Updated Vital Signs BP (!) 141/76 (BP Location: Left Arm)   Pulse (!) 101   Temp 98.5 F (36.9 C) (Oral)   Resp 16   SpO2 100%   Visual Acuity Right Eye Distance:   Left Eye Distance:   Bilateral Distance:    Right Eye Near:   Left Eye Near:    Bilateral Near:     Physical Exam Vitals and nursing note reviewed.  Constitutional:      Appearance: She is well-developed.     Comments: No acute distress  HENT:     Head: Normocephalic and atraumatic.     Ears:     Comments: Bilateral ears without tenderness to palpation of external auricle, tragus and mastoid, EAC's without erythema or swelling, TM's with good bony landmarks and cone of light. Non erythematous.     Nose: Nose normal.     Mouth/Throat:     Comments: Oral mucosa pink and moist, no tonsillar enlargement or exudate. Posterior pharynx patent and nonerythematous, no uvula deviation or swelling. Normal phonation.  Eyes:     Conjunctiva/sclera: Conjunctivae normal.  Cardiovascular:     Rate and Rhythm: Normal rate.  Pulmonary:     Effort: Pulmonary effort is normal. No respiratory distress.     Breath sounds: Wheezing present.     Comments: Audible wheezing with patient taking inspiration, inspiratory and expiratory wheezing throughout all lung fields  After albuterol use in clinic, lungs with only faint  expiratory wheezing, much improved Abdominal:     General: There is no distension.  Musculoskeletal:  General: Normal range of motion.     Cervical back: Neck supple.  Skin:    General: Skin is warm and dry.  Neurological:     Mental Status: She is alert and oriented to person, place, and time.      UC Treatments / Results  Labs (all labs ordered are listed, but only abnormal results are displayed) Labs Reviewed  SARS CORONAVIRUS 2 (TAT 6-24 HRS)    EKG   Radiology No results found.  Procedures Procedures (including critical care time)  Medications Ordered in UC Medications  albuterol (VENTOLIN HFA) 108 (90 Base) MCG/ACT inhaler 2 puff (2 puffs Inhalation Given 05/20/19 1540)    Initial Impression / Assessment and Plan / UC Course  I have reviewed the triage vital signs and the nursing notes.  Pertinent labs & imaging results that were available during my care of the patient were reviewed by me and considered in my medical decision making (see chart for details).     Covid test pending, symptoms 2 weeks ago suspicious of Covid.  Discussed quarantine recommendations.  Exacerbation of asthma today, improved with albuterol, inhaler provided in clinic, continuing on 5-day course of prednisone with continued use of inhaler as needed.  Advised may use Zyrtec/Claritin for allergy symptoms.  Discussed strict return precautions. Patient verbalized understanding and is agreeable with plan.  Final Clinical Impressions(s) / UC Diagnoses   Final diagnoses:  Close exposure to COVID-19 virus  Mild intermittent asthma with acute exacerbation     Discharge Instructions     COVID test pending Use albuterol inhaler as needed for shortness of breath, chest tightness, and wheezing Prednisone daily for 5 days to further calm asthma symptoms Follow up if any symptoms not improving or worsening    ED Prescriptions    Medication Sig Dispense Auth. Provider   predniSONE  (DELTASONE) 50 MG tablet Take 1 tablet (50 mg total) by mouth daily with breakfast. 5 tablet Blessin Kanno C, PA-C     PDMP not reviewed this encounter.   Janith Lima, [place] 05/20/19 2112

## 2019-05-20 NOTE — ED Triage Notes (Signed)
Pt partner tested positive for Covid 19, pt denies any symptoms at this time. She just wanted to be tested to make sure.

## 2019-05-20 NOTE — Discharge Instructions (Addendum)
COVID test pending Use albuterol inhaler as needed for shortness of breath, chest tightness, and wheezing Prednisone daily for 5 days to further calm asthma symptoms Follow up if any symptoms not improving or worsening

## 2019-05-21 LAB — SARS CORONAVIRUS 2 (TAT 6-24 HRS): SARS Coronavirus 2: NEGATIVE

## 2019-06-27 ENCOUNTER — Encounter (HOSPITAL_COMMUNITY): Payer: Self-pay

## 2019-06-27 ENCOUNTER — Ambulatory Visit (HOSPITAL_COMMUNITY)
Admission: EM | Admit: 2019-06-27 | Discharge: 2019-06-27 | Disposition: A | Discharge status: Home / Self Care | Payer: Medicare Other | Attending: Family Medicine | Admitting: Family Medicine

## 2019-06-27 DIAGNOSIS — R0602 Shortness of breath: Secondary | ICD-10-CM

## 2019-06-27 DIAGNOSIS — R062 Wheezing: Secondary | ICD-10-CM

## 2019-06-27 MED ORDER — PREDNISONE 10 MG (21) PO TBPK: ORAL_TABLET | Freq: Every day | ORAL | 0 refills | Status: DC

## 2019-06-27 MED ORDER — ALBUTEROL SULFATE HFA 108 (90 BASE) MCG/ACT IN AERS
INHALATION_SPRAY | RESPIRATORY_TRACT | Status: AC
  Filled 2019-06-27: qty 6.7

## 2019-06-27 MED ORDER — ALBUTEROL SULFATE HFA 108 (90 BASE) MCG/ACT IN AERS
2.0000 | INHALATION_SPRAY | Freq: Once | RESPIRATORY_TRACT | Status: AC
  Administered 2019-06-27: 2 via RESPIRATORY_TRACT

## 2019-06-27 NOTE — ED Provider Notes (Signed)
[place]   [id number] 06/27/19 Arrival Time: [id number]  ASSESSMENT & PLAN:  1. SOB (shortness of breath)   2. Wheezing     No indication for chest imaging at this time. Discussed.  Begin: Meds ordered this encounter  Medications  . albuterol (VENTOLIN HFA) 108 (90 Base) MCG/ACT inhaler 2 puff  . predniSONE (STERAPRED UNI-PAK 21 TAB) 10 MG (21) TBPK tablet    Sig: Take by mouth daily. Take as directed.    Dispense:  21 tablet    Refill:  0    Asthma precautions given. OTC symptom care as needed.  Recommend: Follow-up Information    Lin Landsman, MD.   Specialty: Family Medicine Why: As needed. Contact information: [place] [address] [phone]        [place].   Specialty: Urgent Care Why: If worsening or failing to improve as anticipated. Contact information: [place].   Specialty: Emergency Medicine Why: If symptoms worsen in any way. Contact information: [address] [id number] [place] [phone]          Reviewed expectations re: course of current medical issues. Questions answered. Outlined signs and symptoms indicating need for more acute intervention. Patient verbalized understanding. After Visit Summary given.  SUBJECTIVE: History from: patient.  Julia Shaw is a 44 y.o. female who presents with complaint of fairly persistent wheezing and with occasional dry cough. Onset gradual, a week ago. Triggers: none identified. Describes wheezing as moderate when present. Fever: no. Overall normal PO intake without n/v. Sick contacts: no. Ambulatory without difficulty. No LE edema. Typically her asthma is mostly controlled. Inhaler use: more recently; 'about to run out'. OTC treatment: none. No associated CP. No current  SOB.  Social History   Tobacco Use  Smoking Status Never Smoker  Smokeless Tobacco Never Used     OBJECTIVE:  Vitals:   06/27/19 1803 06/27/19 1804  BP: 119/82   Pulse: (!) 101   Resp: 16   Temp: 98.7 F (37.1 C)   TempSrc: Oral   SpO2: 99%   Weight:  84.8 kg  Height:  5\' 4"  (1.626 m)    Recheck P: 98  General appearance: alert; NAD HEENT: [place]; AT; without nasal congestion Neck: supple without LAD CV: regular Lungs: unlabored respirations, mild to moderate bilateral expiratory wheezing; cough: absent; no significant respiratory distress; speaks full sentences without difficulty Skin: warm and dry Psychological: alert and cooperative; normal mood and affect   No Known Allergies  Past Medical History:  Diagnosis Date  . Allergy   . Anemia   . Arthritis   . Asthma   . Breast lesion    Left  . Bronchitis   . Fibroid   . Hx of trichomoniasis   . Papilloma of breast    left   Family History  Problem Relation Age of Onset  . Diabetes Mother   . Hypertension Mother   . Thyroid disease Father   . Heart disease Father   . Hyperlipidemia Father   . Cancer Maternal Grandmother    Social History   Socioeconomic History  . Marital status: Single    Spouse name: Not on file  . Number of children: Not on file  . Years of education: Not on file  . Highest education level: Not on file  Occupational History  .  Occupation: food prep    Employer: [employer]  Tobacco Use  . Smoking status: Never Smoker  . Smokeless tobacco: Never Used  Substance and Sexual Activity  . Alcohol use: No  . Drug use: No  . Sexual activity: Yes    Birth control/protection: Surgical  Other Topics Concern  . Not on file  Social History Narrative  . Not on file   Social Determinants of Health   Financial Resource Strain:   . Difficulty of Paying Living Expenses:   Food Insecurity:   . Worried About Charity fundraiser in the Last Year:   . Arboriculturist in the Last Year:     Transportation Needs:   . Film/video editor (Medical):   Marland Kitchen Lack of Transportation (Non-Medical):   Physical Activity:   . Days of Exercise per Week:   . Minutes of Exercise per Session:   Stress:   . Feeling of Stress :   Social Connections:   . Frequency of Communication with Friends and Family:   . Frequency of Social Gatherings with Friends and Family:   . Attends Religious Services:   . Active Member of Clubs or Organizations:   . Attends Archivist Meetings:   Marland Kitchen Marital Status:   Intimate Partner Violence:   . Fear of Current or Ex-Partner:   . Emotionally Abused:   Marland Kitchen Physically Abused:   . Sexually Abused:             Shylin Kick, MD 06/28/19 [phone]

## 2019-06-27 NOTE — ED Triage Notes (Signed)
Pt c/o SOB, wheezingx 1wk. Pt has non labored breathing. Skin color WNL. Pt has inspiratory and expiratory wheezes throughout.

## 2019-07-28 ENCOUNTER — Other Ambulatory Visit: Payer: Self-pay

## 2019-07-28 ENCOUNTER — Encounter (HOSPITAL_COMMUNITY): Payer: Self-pay

## 2019-07-28 ENCOUNTER — Ambulatory Visit (HOSPITAL_COMMUNITY)
Admission: EM | Admit: 2019-07-28 | Discharge: 2019-07-28 | Disposition: A | Discharge status: Home / Self Care | Payer: Medicare Other | Attending: Family Medicine | Admitting: Family Medicine

## 2019-07-28 DIAGNOSIS — R05 Cough: Secondary | ICD-10-CM

## 2019-07-28 DIAGNOSIS — R0602 Shortness of breath: Secondary | ICD-10-CM

## 2019-07-28 DIAGNOSIS — R059 Cough, unspecified: Secondary | ICD-10-CM

## 2019-07-28 DIAGNOSIS — R062 Wheezing: Secondary | ICD-10-CM | POA: Diagnosis not present

## 2019-07-28 DIAGNOSIS — J40 Bronchitis, not specified as acute or chronic: Secondary | ICD-10-CM

## 2019-07-28 MED ORDER — PREDNISONE 10 MG (21) PO TBPK: ORAL_TABLET | Freq: Every day | ORAL | 0 refills | Status: AC

## 2019-07-28 MED ORDER — AEROCHAMBER PLUS FLO-VU MISC
1.0000 | Freq: Once | Status: AC
  Administered 2019-07-28: 1

## 2019-07-28 MED ORDER — DEXAMETHASONE SODIUM PHOSPHATE 10 MG/ML IJ SOLN
10.0000 mg | Freq: Once | INTRAMUSCULAR | Status: AC
  Administered 2019-07-28: 10 mg via INTRAMUSCULAR

## 2019-07-28 MED ORDER — ALBUTEROL SULFATE HFA 108 (90 BASE) MCG/ACT IN AERS
INHALATION_SPRAY | RESPIRATORY_TRACT | Status: AC
  Filled 2019-07-28: qty 6.7

## 2019-07-28 MED ORDER — DEXAMETHASONE SODIUM PHOSPHATE 10 MG/ML IJ SOLN
INTRAMUSCULAR | Status: AC
  Filled 2019-07-28: qty 1

## 2019-07-28 MED ORDER — AEROCHAMBER PLUS FLO-VU LARGE MISC
Status: AC
  Filled 2019-07-28: qty 1

## 2019-07-28 NOTE — ED Triage Notes (Signed)
Pt presents for shortness of breath, cough, and wheezing x3 days. Pt denies OTC treatments or relieving factors. Pt states "asthma is always worse this time of year". Pt denies chest pain. Pt denies n/v/d, fever, chills, body aches, headache, sore throat, runny nose.   Pt noted to be resting on treatment table with even and unlabored respirations. No accessory muscle use noted.

## 2019-07-28 NOTE — ED Notes (Signed)
RN to waiting room to assess pt. Pt found to be breathing even and unlabored. Pt ambulatory. Pt speaking in complete sentences.

## 2019-07-28 NOTE — Discharge Instructions (Addendum)
I have sent in a prednisone taper for you to take for 6 days. 6 tablets on day one, 5 tablets on day two, 4 tablets on day three, 3 tablets on day four, 2 tablets on day five, and 1 tablet on day six.  Your COVID test is pending.  You should self quarantine until the test result is back.    Take Tylenol as needed for fever or discomfort.  Rest and keep yourself hydrated.    Go to the emergency department if you develop shortness of breath, severe diarrhea, high fever not relieved by Tylenol or ibuprofen, or other concerning symptoms.

## 2019-08-01 NOTE — ED Provider Notes (Signed)
[facility]   [id number] 07/28/19 Arrival Time: 7672   SUBJECTIVE:  Julia Shaw is a 44 y.o. female who presents with complaint of nasal congestion, post-nasal drainage, wheezing, SOB and a persistent cough. Onset abrupt approximately 3 days ago. Overall fatigued. SOB: mild. Wheezing: moderate. OTC treatment: none. Social History   Tobacco Use  Smoking Status Never Smoker  Smokeless Tobacco Never Used    ROS: As per HPI.   OBJECTIVE:  Vitals:   07/28/19 1613  BP: 110/66  Pulse: 94  Resp: 18  Temp: 98.2 F (36.8 C)  TempSrc: Oral  SpO2: 100%     General appearance: alert; no distress HEENT: nasal congestion; clear runny nose; throat irritation secondary to post-nasal drainage Neck: supple without LAD Lungs: diffuse wheezing throughout lung fields.  Skin: warm and dry Psychological: alert and cooperative; normal mood and affect  Results for orders placed or performed during the hospital encounter of 05/20/19  SARS CORONAVIRUS 2 (TAT 6-24 HRS) Nasopharyngeal Nasopharyngeal Swab   Specimen: Nasopharyngeal Swab  Result Value Ref Range   SARS Coronavirus 2 NEGATIVE NEGATIVE    Labs Reviewed  SARS CORONAVIRUS 2 (TAT 6-24 HRS)    No results found.  No Known Allergies  Past Medical History:  Diagnosis Date   Allergy    Anemia    Arthritis    Asthma    Breast lesion    Left   Bronchitis    Fibroid    Hx of trichomoniasis    Papilloma of breast    left   Social History   Socioeconomic History   Marital status: Single    Spouse name: Not on file   Number of children: Not on file   Years of education: Not on file   Highest education level: Not on file  Occupational History   Occupation: food prep    Employer: [employer]  Tobacco Use   Smoking status: Never Smoker   Smokeless tobacco: Never Used  Substance and Sexual Activity   Alcohol use: No   Drug use: No   Sexual activity: Yes    Birth control/protection:  Surgical  Other Topics Concern   Not on file  Social History Narrative   Not on file   Social Determinants of Health   Financial Resource Strain:    Difficulty of Paying Living Expenses:   Food Insecurity:    Worried About Charity fundraiser in the Last Year:    Arboriculturist in the Last Year:   Transportation Needs:    Film/video editor (Medical):    Lack of Transportation (Non-Medical):   Physical Activity:    Days of Exercise per Week:    Minutes of Exercise per Session:   Stress:    Feeling of Stress :   Social Connections:    Frequency of Communication with Friends and Family:    Frequency of Social Gatherings with Friends and Family:    Attends Religious Services:    Active Member of Clubs or Organizations:    Attends Music therapist:    Marital Status:   Intimate Partner Violence:    Fear of Current or Ex-Partner:    Emotionally Abused:    Physically Abused:    Sexually Abused:    Family History  Problem Relation Age of Onset   Diabetes Mother    Hypertension Mother    Thyroid disease Father    Heart disease Father    Hyperlipidemia Father    Cancer  Maternal Grandmother      ASSESSMENT & PLAN:  1. Bronchitis   2. Wheezing   3. Shortness of breath   4. Cough     Meds ordered this encounter  Medications   aerochamber plus with mask device 1 each   dexamethasone (DECADRON) injection 10 mg   predniSONE (STERAPRED UNI-PAK 21 TAB) 10 MG (21) TBPK tablet    Sig: Take by mouth daily for 6 days. Take 6 tablets on day 1, 5 tablets on day 2, 4 tablets on day 3, 3 tablets on day 4, 2 tablets on day 5, 1 tablet on day 6    Dispense:  21 tablet    Refill:  0    Order Specific Question:   Supervising Provider    Answer:   Chase Picket [id number]    Will treat with above therapy given duration of symptoms.  Cough medication sedation precautions.  OTC symptom care as needed. Will plan f/u with PCP or here  as needed.  Reviewed expectations re: course of current medical issues. Questions answered. Outlined signs and symptoms indicating need for more acute intervention. Patient verbalized understanding. After Visit Summary given.            Faustino Congress, NP 08/01/19 1425

## 2019-08-27 ENCOUNTER — Other Ambulatory Visit: Payer: Self-pay

## 2019-08-27 ENCOUNTER — Encounter (HOSPITAL_COMMUNITY): Payer: Self-pay

## 2019-08-27 DIAGNOSIS — B349 Viral infection, unspecified: Secondary | ICD-10-CM | POA: Diagnosis not present

## 2019-08-27 DIAGNOSIS — J45909 Unspecified asthma, uncomplicated: Secondary | ICD-10-CM | POA: Diagnosis not present

## 2019-08-27 DIAGNOSIS — R197 Diarrhea, unspecified: Secondary | ICD-10-CM | POA: Insufficient documentation

## 2019-08-27 DIAGNOSIS — R109 Unspecified abdominal pain: Secondary | ICD-10-CM | POA: Diagnosis not present

## 2019-08-27 DIAGNOSIS — R112 Nausea with vomiting, unspecified: Secondary | ICD-10-CM | POA: Diagnosis not present

## 2019-08-27 DIAGNOSIS — R509 Fever, unspecified: Secondary | ICD-10-CM | POA: Diagnosis present

## 2019-08-27 DIAGNOSIS — Z7951 Long term (current) use of inhaled steroids: Secondary | ICD-10-CM | POA: Insufficient documentation

## 2019-08-27 DIAGNOSIS — Z20822 Contact with and (suspected) exposure to covid-19: Secondary | ICD-10-CM | POA: Insufficient documentation

## 2019-08-27 LAB — URINALYSIS, ROUTINE W REFLEX MICROSCOPIC
Bilirubin Urine: NEGATIVE
Glucose, UA: NEGATIVE mg/dL
Ketones, ur: NEGATIVE mg/dL
Nitrite: NEGATIVE
Protein, ur: 30 mg/dL — AB
RBC / HPF: >50 RBC/hpf — ABNORMAL HIGH (ref 0–5)
Specific Gravity, Urine: 1.01 (ref 1.005–1.030)
pH: 7 (ref 5.0–8.0)

## 2019-08-27 LAB — COMPREHENSIVE METABOLIC PANEL
ALT: 18 U/L (ref 0–44)
AST: 19 U/L (ref 15–41)
Albumin: 3.8 g/dL (ref 3.5–5.0)
Alkaline Phosphatase: 66 U/L (ref 38–126)
Anion gap: 10 (ref 5–15)
BUN: 6 mg/dL (ref 6–20)
CO2: 23 mmol/L (ref 22–32)
Calcium: 8.8 mg/dL — ABNORMAL LOW (ref 8.9–10.3)
Chloride: 107 mmol/L (ref 98–111)
Creatinine, Ser: 0.66 mg/dL (ref 0.44–1.00)
GFR calc Af Amer: >60 mL/min (ref >60)
GFR calc non Af Amer: >60 mL/min (ref >60)
Glucose, Bld: 110 mg/dL — ABNORMAL HIGH (ref 70–99)
Potassium: 3 mmol/L — ABNORMAL LOW (ref 3.5–5.1)
Sodium: 140 mmol/L (ref 135–145)
Total Bilirubin: 1.7 mg/dL — ABNORMAL HIGH (ref 0.3–1.2)
Total Protein: 7.6 g/dL (ref 6.5–8.1)

## 2019-08-27 LAB — CBC
HCT: 36 % (ref 36.0–46.0)
Hemoglobin: 12.6 g/dL (ref 12.0–15.0)
MCH: 29.9 pg (ref 26.0–34.0)
MCHC: 35 g/dL (ref 30.0–36.0)
MCV: 85.5 fL (ref 80.0–100.0)
Platelets: 286 10*3/uL (ref 150–400)
RBC: 4.21 MIL/uL (ref 3.87–5.11)
RDW: 13.5 % (ref 11.5–15.5)
WBC: 12.9 10*3/uL — ABNORMAL HIGH (ref 4.0–10.5)
nRBC: 0 % (ref 0.0–0.2)

## 2019-08-27 LAB — LIPASE, BLOOD: Lipase: 18 U/L (ref 11–51)

## 2019-08-27 LAB — I-STAT BETA HCG BLOOD, ED (MC, WL, AP ONLY): I-stat hCG, quantitative: <5 m[IU]/mL (ref <5)

## 2019-08-27 LAB — SARS CORONAVIRUS 2 BY RT PCR (HOSPITAL ORDER, PERFORMED IN ~~LOC~~ HOSPITAL LAB): SARS Coronavirus 2: NEGATIVE

## 2019-08-27 MED ORDER — ACETAMINOPHEN 325 MG PO TABS
650.0000 mg | ORAL_TABLET | Freq: Once | ORAL | Status: AC | PRN
  Administered 2019-08-27: 650 mg via ORAL
  Filled 2019-08-27: qty 2

## 2019-08-27 NOTE — ED Triage Notes (Signed)
NVD since this morning

## 2019-08-27 NOTE — ED Notes (Signed)
Blue top and lactic on ice also collected plus a urine culture

## 2019-08-28 ENCOUNTER — Encounter (HOSPITAL_COMMUNITY): Payer: Self-pay

## 2019-08-28 ENCOUNTER — Ambulatory Visit (HOSPITAL_COMMUNITY)
Admission: EM | Admit: 2019-08-28 | Discharge: 2019-08-28 | Disposition: A | Discharge status: Home / Self Care | Payer: Self-pay | Attending: Family Medicine | Admitting: Family Medicine

## 2019-08-28 ENCOUNTER — Emergency Department (HOSPITAL_COMMUNITY)
Admission: EM | Admit: 2019-08-28 | Discharge: 2019-08-28 | Disposition: A | Discharge status: Home / Self Care | Payer: 59 | Attending: Emergency Medicine | Admitting: Emergency Medicine

## 2019-08-28 DIAGNOSIS — B349 Viral infection, unspecified: Secondary | ICD-10-CM

## 2019-08-28 DIAGNOSIS — R110 Nausea: Secondary | ICD-10-CM

## 2019-08-28 DIAGNOSIS — N10 Acute tubulo-interstitial nephritis: Secondary | ICD-10-CM

## 2019-08-28 DIAGNOSIS — R1110 Vomiting, unspecified: Secondary | ICD-10-CM

## 2019-08-28 DIAGNOSIS — K648 Other hemorrhoids: Secondary | ICD-10-CM

## 2019-08-28 LAB — POCT URINALYSIS DIP (DEVICE)
Bilirubin Urine: NEGATIVE
Glucose, UA: NEGATIVE mg/dL
Ketones, ur: 15 mg/dL — AB
Nitrite: POSITIVE — AB
Protein, ur: 100 mg/dL — AB
Specific Gravity, Urine: 1.01 (ref 1.005–1.030)
Urobilinogen, UA: 2 mg/dL — ABNORMAL HIGH (ref 0.0–1.0)
pH: 6.5 (ref 5.0–8.0)

## 2019-08-28 MED ORDER — CIPROFLOXACIN HCL 500 MG PO TABS
500.0000 mg | ORAL_TABLET | Freq: Two times a day (BID) | ORAL | 0 refills | Status: DC
Start: 2019-08-28 — End: 2019-10-07

## 2019-08-28 MED ORDER — LIDOCAINE VISCOUS HCL 2 % MT SOLN
15.0000 mL | Freq: Once | OROMUCOSAL | Status: AC
  Administered 2019-08-28: 15 mL via ORAL
  Filled 2019-08-28: qty 15

## 2019-08-28 MED ORDER — CEFTRIAXONE SODIUM 1 G IJ SOLR
1.0000 g | Freq: Once | INTRAMUSCULAR | Status: AC
  Administered 2019-08-28: 1 g via INTRAMUSCULAR

## 2019-08-28 MED ORDER — ACETAMINOPHEN 325 MG PO TABS
ORAL_TABLET | ORAL | Status: AC
  Filled 2019-08-28: qty 3

## 2019-08-28 MED ORDER — ACETAMINOPHEN 325 MG PO TABS
975.0000 mg | ORAL_TABLET | Freq: Once | ORAL | Status: AC
  Administered 2019-08-28: 975 mg via ORAL

## 2019-08-28 MED ORDER — ONDANSETRON HCL 4 MG/2ML IJ SOLN
4.0000 mg | Freq: Once | INTRAMUSCULAR | Status: AC
  Administered 2019-08-28: 4 mg via INTRAVENOUS
  Filled 2019-08-28: qty 2

## 2019-08-28 MED ORDER — ACETAMINOPHEN 325 MG PO TABS
ORAL_TABLET | ORAL | Status: AC
  Filled 2019-08-28: qty 1

## 2019-08-28 MED ORDER — SODIUM CHLORIDE 0.9 % IV BOLUS
500.0000 mL | Freq: Once | INTRAVENOUS | Status: AC
  Administered 2019-08-28: 500 mL via INTRAVENOUS

## 2019-08-28 MED ORDER — ONDANSETRON 4 MG PO TBDP
4.0000 mg | ORAL_TABLET | Freq: Once | ORAL | Status: AC
  Administered 2019-08-28: 4 mg via ORAL

## 2019-08-28 MED ORDER — CEFTRIAXONE SODIUM 1 G IJ SOLR
INTRAMUSCULAR | Status: AC
  Filled 2019-08-28: qty 10

## 2019-08-28 MED ORDER — PRAMOXINE HCL (PERIANAL) 1 % EX FOAM: 1.0000 "application " | Freq: Three times a day (TID) | CUTANEOUS | 0 refills | Status: DC | PRN

## 2019-08-28 MED ORDER — ALUM & MAG HYDROXIDE-SIMETH 200-200-20 MG/5ML PO SUSP
30.0000 mL | Freq: Once | ORAL | Status: AC
  Administered 2019-08-28: 30 mL via ORAL
  Filled 2019-08-28: qty 30

## 2019-08-28 MED ORDER — ONDANSETRON 4 MG PO TBDP
ORAL_TABLET | ORAL | Status: AC
  Filled 2019-08-28: qty 1

## 2019-08-28 MED ORDER — ONDANSETRON 4 MG PO TBDP
4.0000 mg | ORAL_TABLET | Freq: Three times a day (TID) | ORAL | 0 refills | Status: DC | PRN
Start: 2019-08-28 — End: 2019-12-11

## 2019-08-28 MED ORDER — LIDOCAINE HCL (PF) 1 % IJ SOLN
INTRAMUSCULAR | Status: AC
  Filled 2019-08-28: qty 2

## 2019-08-28 NOTE — ED Provider Notes (Signed)
[facility] DEPT Provider Note   CSN: [id number] Arrival date & time: 08/27/19  2222     History Chief Complaint  Patient presents with  . Emesis    Julia Shaw is a 44 y.o. female.  HPI   Patient presents to the emergency department with chief complaint of fever, chills, general malaise, nausea, vomiting, diarrhea since yesterday.  Patient explains she felt fine yesterday morning but as the day progressed she progressively gotten worse, she admits to 2 episodes of vomiting and one episode of diarrhea denies seeing blood in either.  She also admits to general abdominal pain that is right above her bellybutton, it is a crampy pain that does not radiate and there is no alleviating or aggravating factors.  She denies any recent sick contacts, denies being vaccinated for COVID-19.  She denies chest pain, shortness of breath, is tolerating p.o.. Patient has significant medical history of allergies, asthma, and arthritis.  She takes her inhaler as needed, she denies headache, nasal congestion, earache, sore throat, cough, chest pain, shortness of breath, dysuria, lower back pain, urgency, pedal edema.  Past Medical History:  Diagnosis Date  . Allergy   . Anemia   . Arthritis   . Asthma   . Breast lesion    Left  . Bronchitis   . Fibroid   . Hx of trichomoniasis   . Papilloma of breast    left    Patient Active Problem List   Diagnosis Date Noted  . Asthma 08/30/2015  . Allergy 08/30/2015  . Fibroid, uterus 03/03/2011    Past Surgical History:  Procedure Laterality Date  . BREAST LUMPECTOMY WITH RADIOACTIVE SEED LOCALIZATION Left 11/01/2016   Procedure: LEFT BREAST RADIOACTIVE SEED GUIDED LUMPECTOMY;  Surgeon: Jovita Kussmaul, MD;  Location: [facility];  Service: General;  Laterality: Left;  . BUNIONECTOMY     right foot  . DENTAL SURGERY    . ROBOT ASSISTED MYOMECTOMY  03/03/2011   Procedure: ROBOTIC ASSISTED MYOMECTOMY;  Surgeon: Alwyn Pea, MD;  Location: [facility];  Service: Gynecology;  Laterality: N/A;  . TUBAL LIGATION       OB History    Gravida  3   Para  2   Term  2   Preterm      AB  1   Living  2     SAB  1   TAB      Ectopic      Multiple      Live Births  2           Family History  Problem Relation Age of Onset  . Diabetes Mother   . Hypertension Mother   . Thyroid disease Father   . Heart disease Father   . Hyperlipidemia Father   . Cancer Maternal Grandmother     Social History   Tobacco Use  . Smoking status: Never Smoker  . Smokeless tobacco: Never Used  Substance Use Topics  . Alcohol use: No  . Drug use: No    Home Medications Prior to Admission medications   Medication Sig Start Date End Date Taking? Authorizing Provider  albuterol (PROVENTIL HFA;VENTOLIN HFA) 108 (90 Base) MCG/ACT inhaler Inhale 1-2 puffs into the lungs every 6 (six) hours as needed for wheezing or shortness of breath. 04/10/18  Yes Yu, Amy V, PA-C  cetirizine (ZYRTEC ALLERGY) 10 MG tablet Take 1 tablet (10 mg total) by mouth daily. Patient not taking: Reported on 08/28/2019 05/04/19  Ford, Kelsey N, PA-C  DULERA 100-5 MCG/ACT AERO Inhale 2 puffs into the lungs every 6 (six) hours as needed for wheezing.  08/14/15 06/27/19  [provider]  fluticasone (FLONASE) 50 MCG/ACT nasal spray Place 2 sprays into both nostrils daily. 05/04/19 06/27/19  Jacqlyn Larsen, PA-C    Allergies    Patient has no known allergies.  Review of Systems   Review of Systems  Constitutional: Positive for chills and fever.  HENT: Negative for congestion, ear pain and trouble swallowing.   Eyes: Negative for visual disturbance.  Respiratory: Negative for cough and shortness of breath.   Cardiovascular: Negative for chest pain, palpitations and leg swelling.  Gastrointestinal: Positive for abdominal pain, diarrhea, nausea and vomiting.  Genitourinary: Negative for dysuria, enuresis, flank pain, frequency and urgency.   Musculoskeletal: Positive for myalgias. Negative for back pain.  Skin: Negative for rash.  Neurological: Negative for dizziness, weakness and headaches.  Hematological: Does not bruise/bleed easily.    Physical Exam Updated Vital Signs BP 114/70   Pulse 80   Temp 98.2 F (36.8 C) (Oral)   Resp 15   SpO2 100%   Physical Exam Vitals and nursing note reviewed.  Constitutional:      General: She is not in acute distress.    Appearance: She is not ill-appearing.  HENT:     Head: Normocephalic and atraumatic.     Nose: No congestion.     Mouth/Throat:     Mouth: Mucous membranes are moist.     Pharynx: Oropharynx is clear.  Eyes:     General: No scleral icterus. Cardiovascular:     Rate and Rhythm: Normal rate and regular rhythm.     Pulses: Normal pulses.     Heart sounds: No murmur heard.  No friction rub. No gallop.   Pulmonary:     Effort: No respiratory distress.     Breath sounds: No wheezing, rhonchi or rales.  Abdominal:     General: There is no distension.     Palpations: Abdomen is soft.     Tenderness: There is no abdominal tenderness. There is no right CVA tenderness, left CVA tenderness or guarding.     Comments: Patient's abdomen was nondistended, normal active bowel sounds, dull to percussion, slight tender to palpation in her mid abdomen, does not radiate, negative Murphy sign, negative McBurney point, negative rebound tenderness, no signs of acute abdomen.  Musculoskeletal:        General: No swelling.  Skin:    General: Skin is warm and dry.     Capillary Refill: Capillary refill takes less than 2 seconds.     Findings: No rash.  Neurological:     Mental Status: She is alert.  Psychiatric:        Mood and Affect: Mood normal.     ED Results / Procedures / Treatments   Labs (all labs ordered are listed, but only abnormal results are displayed) Labs Reviewed  COMPREHENSIVE METABOLIC PANEL - Abnormal; Notable for the following components:       Result Value   Potassium 3.0 (*)    Glucose, Bld 110 (*)    Calcium 8.8 (*)    Total Bilirubin 1.7 (*)    All other components within normal limits  CBC - Abnormal; Notable for the following components:   WBC 12.9 (*)    All other components within normal limits  URINALYSIS, ROUTINE W REFLEX MICROSCOPIC - Abnormal; Notable for the following components:   APPearance  CLOUDY (*)    Hgb urine dipstick MODERATE (*)    Protein, ur 30 (*)    Leukocytes,Ua LARGE (*)    RBC / HPF >50 (*)    Bacteria, UA MANY (*)    All other components within normal limits  SARS CORONAVIRUS 2 BY RT PCR (HOSPITAL ORDER, [place] [facility])  URINE CULTURE  LIPASE, BLOOD  I-STAT BETA HCG BLOOD, ED ([facility], [facility], [facility] ONLY)    EKG None  Radiology No results found.  Procedures Procedures (including critical care time)  Medications Ordered in ED Medications  acetaminophen (TYLENOL) tablet 650 mg (650 mg Oral Given 08/27/19 2235)  ondansetron (ZOFRAN) injection 4 mg (4 mg Intravenous Given 08/28/19 0922)  sodium chloride 0.9 % bolus 500 mL (0 mLs Intravenous Stopped 08/28/19 1101)  alum & mag hydroxide-simeth (MAALOX/MYLANTA) 200-200-20 MG/5ML suspension 30 mL (30 mLs Oral Given 08/28/19 0900)    And  lidocaine (XYLOCAINE) 2 % viscous mouth solution 15 mL (15 mLs Oral Given 08/28/19 0900)    ED Course  I have reviewed the triage vital signs and the nursing notes.  Pertinent labs & imaging results that were available during my care of the patient were reviewed by me and considered in my medical decision making (see chart for details).    MDM Rules/Calculators/A&P                          I have personally reviewed all imaging, labs and have interpreted them.  I have low suspicion for pyelonephritis or UTI as patient denies dysuria, urgency, frequency, flank pain, she had no CVA tenderness upon palpation, benign abdominal exam, UA showed large leukocytes, RBC, many white blood cells, many  bacteria.  Will not treat patient as she is asymptomatic, but will send urine for culture.  Unlikely patient suffering from pancreatitis as she has no risk factors, she does not drink alcohol she is nondiabetic, has no history of gallstones, she has no epigastric pain, lipase was 18.  I have low suspicion for a bowel obstruction or diverticulitis as patient denies constipation, admits to flatus as well as diarrhea, abdominal exam was unremarkable no lower left quadrant pain, no signs of acute abdomen.  Unlikely patient suffering from cholecystitis as she denies biliary colic, CMP does not show elevated liver enzymes or alk phos.  Low suspicion for ectopic pregnancy as hCG was negative.  Patient was given fluids, Tylenol, GI cocktail and has responded well to the treatment.  On reevaluation she denies any abdominal pain, denies nausea or vomiting, tolerating p.o. without difficulty, vital signs reassuring she is afebrile, nontachycardic, normotensive.  Likely patient suffering from a viral URI and this would explain her general malaise, nausea, vomiting, diarrhea as well as leukocytosis, and fever upon arrival.  Patient appears to be resting calmly in bed showing no acute signs stress.  Vital signs have improved and remained stable does not meet criteria to be admitted to the hospital.  Likely patient symptoms are result of a viral URI and I recommend patient takes over-the-counter pain medications and follows up with her primary care provider.  Urine cultures were sent and if positive will contact and provide antibiotics.  Patient was discussed with attending who agrees assessment plan.  Patient was given at home care will strict return precautions.  Patient verbalized that she understood and agreed with said plan. Final Clinical Impression(s) / ED Diagnoses Final diagnoses:  Viral infection  Vomiting and  diarrhea    Rx / DC Orders ED Discharge Orders    None       Aron Baba 08/28/19  1237    Blanchie Dessert, MD 08/28/19 1546

## 2019-08-28 NOTE — Discharge Instructions (Addendum)
Please do your best to ensure adequate fluid intake in order to avoid dehydration. If you find that you are unable to tolerate drinking fluids regularly please proceed to the Emergency Department for evaluation.  You should return to the hospital if you experience persistent fevers for greater than 1-2 more days, worsening symptoms despite medications, dizziness, syncope (fainting), or for any other concerns you may find worrisome.

## 2019-08-28 NOTE — Discharge Instructions (Addendum)
You have been seen here for nausea, vomiting, diarrhea all lab work and imaging look reassuring.  Likely you are suffering from a viral infection.  I recommend that you take over-the-counter medications like Tylenol and ibuprofen as this can help with fever control as well as pain. please take every 6 hours and follow dosing on the back of bottle.  I want you to follow-up with your primary care doctor if symptoms do not resolve in 1 to 2 weeks as I feel further evaluation management will be needed at that time.  I want to come back to emergency department if you develop shortness of breath, chest pain, severe abdominal pain, uncontrolled nausea, vomiting, diarrhea as he symptoms require further evaluation management.

## 2019-08-28 NOTE — ED Triage Notes (Signed)
Pt present to UC for n/v x2 days. Pt states she is also experiencing "shakes". Pt states she was seen at [facility] for same, was febrile on arrival, treated and discharged.   Of noted pt tested for covid at [facility] on 7/27, results negative.

## 2019-08-29 NOTE — ED Provider Notes (Signed)
[facility]    ASSESSMENT & PLAN:  1. Acute pyelonephritis   2. Nausea   3. Prolapsed internal hemorrhoids      Meds ordered this encounter  Medications  . acetaminophen (TYLENOL) tablet 975 mg  . ondansetron (ZOFRAN-ODT) disintegrating tablet 4 mg  . cefTRIAXone (ROCEPHIN) injection 1 g  . ciprofloxacin (CIPRO) 500 MG tablet    Sig: Take 1 tablet (500 mg total) by mouth 2 (two) times daily.    Dispense:  14 tablet    Refill:  0  . ondansetron (ZOFRAN-ODT) 4 MG disintegrating tablet    Sig: Take 1 tablet (4 mg total) by mouth every 8 (eight) hours as needed for nausea or vomiting.    Dispense:  15 tablet    Refill:  0  . pramoxine (PROCTOFOAM) 1 % foam    Sig: Place 1 application rectally 3 (three) times daily as needed for anal itching.    Dispense:  15 g    Refill:  0    Urine culture sent. Will notify patient when results available. Will follow up with her PCP or here if not showing improvement over the next 48 hours, sooner if needed.  Outlined signs and symptoms indicating need for more acute intervention. Patient verbalized understanding. After Visit Summary given.  SUBJECTIVE:  Julia Shaw is a 44 y.o. female who was seen in ED yest; note reviewed. Still with fever and chills. Nausea. Decreased fluid intake. "Feeling shakes now". COVID negative. No OTC tx. Mild abdominal/flank discomfort. Ambulatory without difficulty.  LMP: Patient's last menstrual period was 08/27/2019.   OBJECTIVE:  Vitals:   08/28/19 1341 08/28/19 1426  BP: (!) 112/61   Pulse: (!) 130   Resp: 16   Temp: (!) 100.7 F (38.2 C) 100 F (37.8 C)  TempSrc: Oral Oral  SpO2: 99%    General appearance: alert; appears fatigued HENT: oropharynx: moist Lungs: unlabored respirations CV: reg; tachycardic Abdomen: soft, non-tender; bowel sounds normal; no masses or organomegaly; no guarding or rebound tenderness Back: no CVA tenderness Extremities: no edema; symmetrical  with no gross deformities Skin: warm and dry Neurologic: normal gait Psychological: alert and cooperative; normal mood and affect  Labs Reviewed  POCT URINALYSIS DIP (DEVICE) - Abnormal; Notable for the following components:      Result Value   Ketones, ur 15 (*)    Hgb urine dipstick MODERATE (*)    Protein, ur 100 (*)    Urobilinogen, UA 2.0 (*)    Nitrite POSITIVE (*)    Leukocytes,Ua SMALL (*)    All other components within normal limits    No Known Allergies  Past Medical History:  Diagnosis Date  . Allergy   . Anemia   . Arthritis   . Asthma   . Breast lesion    Left  . Bronchitis   . Fibroid   . Hx of trichomoniasis   . Papilloma of breast    left   Social History   Socioeconomic History  . Marital status: Single    Spouse name: Not on file  . Number of children: Not on file  . Years of education: Not on file  . Highest education level: Not on file  Occupational History  . Occupation: food prep    Employer: [employer]  Tobacco Use  . Smoking status: Never Smoker  . Smokeless tobacco: Never Used  Substance and Sexual Activity  . Alcohol use: No  . Drug use: No  . Sexual activity: Yes  Birth control/protection: Surgical  Other Topics Concern  . Not on file  Social History Narrative  . Not on file   Social Determinants of Health   Financial Resource Strain:   . Difficulty of Paying Living Expenses:   Food Insecurity:   . Worried About Charity fundraiser in the Last Year:   . Arboriculturist in the Last Year:   Transportation Needs:   . Film/video editor (Medical):   Marland Kitchen Lack of Transportation (Non-Medical):   Physical Activity:   . Days of Exercise per Week:   . Minutes of Exercise per Session:   Stress:   . Feeling of Stress :   Social Connections:   . Frequency of Communication with Friends and Family:   . Frequency of Social Gatherings with Friends and Family:   . Attends Religious Services:   . Active Member of Clubs or  Organizations:   . Attends Archivist Meetings:   Marland Kitchen Marital Status:   Intimate Partner Violence:   . Fear of Current or Ex-Partner:   . Emotionally Abused:   Marland Kitchen Physically Abused:   . Sexually Abused:    Family History  Problem Relation Age of Onset  . Diabetes Mother   . Hypertension Mother   . Thyroid disease Father   . Heart disease Father   . Hyperlipidemia Father   . Cancer Maternal Izetta Dakin, MD 08/29/19 1000

## 2019-08-30 LAB — URINE CULTURE: Culture: >=100000 — AB

## 2019-08-31 ENCOUNTER — Telehealth: Payer: Self-pay | Admitting: Emergency Medicine

## 2019-08-31 NOTE — Telephone Encounter (Signed)
Post ED Visit - Positive Culture Follow-up  Culture report reviewed by antimicrobial stewardship pharmacist: [facility] Team []  Elenor Quinones, Pharm.D. []  Heide Guile, Pharm.D., BCPS AQ-ID []  Parks Neptune, Pharm.D., BCPS []  Alycia Rossetti, Pharm.D., BCPS []  [place], Pharm.D., BCPS, AAHIVP []  Legrand Como, Pharm.D., BCPS, AAHIVP []  Salome Arnt, PharmD, BCPS []  Johnnette Gourd, PharmD, BCPS []  Hughes Better, PharmD, BCPS []  Leeroy Cha, PharmD []  Laqueta Linden, PharmD, BCPS []  Albertina Parr, PharmD  [facility] Team []  Leodis Sias, PharmD []  Lindell Spar, PharmD []  Royetta Asal, PharmD [x]  Graylin Shiver, Rph []  Rema Fendt) Glennon Mac, PharmD []  Arlyn Dunning, PharmD []  Netta Cedars, PharmD []  Dia Sitter, PharmD []  Leone Haven, PharmD []  Gretta Arab, PharmD []  Theodis Shove, PharmD []  Peggyann Juba, PharmD []  Reuel Boom, PharmD   Positive urine culture Treated with ciprofloxacin, organism sensitive to the same and no further patient follow-up is required at this time.  Hazle Nordmann 08/31/2019, 10:44 AM

## 2019-09-01 ENCOUNTER — Encounter (HOSPITAL_COMMUNITY): Payer: Self-pay

## 2019-09-01 ENCOUNTER — Ambulatory Visit (HOSPITAL_COMMUNITY)
Admission: EM | Admit: 2019-09-01 | Discharge: 2019-09-01 | Disposition: A | Discharge status: Home / Self Care | Payer: 59 | Attending: Physician Assistant | Admitting: Physician Assistant

## 2019-09-01 ENCOUNTER — Other Ambulatory Visit: Payer: Self-pay

## 2019-09-01 DIAGNOSIS — Z79899 Other long term (current) drug therapy: Secondary | ICD-10-CM | POA: Diagnosis not present

## 2019-09-01 DIAGNOSIS — R0602 Shortness of breath: Secondary | ICD-10-CM | POA: Diagnosis not present

## 2019-09-01 DIAGNOSIS — J45901 Unspecified asthma with (acute) exacerbation: Secondary | ICD-10-CM | POA: Insufficient documentation

## 2019-09-01 DIAGNOSIS — Z20822 Contact with and (suspected) exposure to covid-19: Secondary | ICD-10-CM | POA: Insufficient documentation

## 2019-09-01 DIAGNOSIS — R05 Cough: Secondary | ICD-10-CM | POA: Diagnosis present

## 2019-09-01 LAB — SARS CORONAVIRUS 2 (TAT 6-24 HRS): SARS Coronavirus 2: NEGATIVE

## 2019-09-01 MED ORDER — PREDNISONE 10 MG PO TABS: 40.0000 mg | ORAL_TABLET | Freq: Every day | ORAL | 0 refills | Status: AC

## 2019-09-01 MED ORDER — ALBUTEROL SULFATE HFA 108 (90 BASE) MCG/ACT IN AERS
2.0000 | INHALATION_SPRAY | Freq: Once | RESPIRATORY_TRACT | Status: AC
  Administered 2019-09-01: 2 via RESPIRATORY_TRACT

## 2019-09-01 MED ORDER — ALBUTEROL SULFATE HFA 108 (90 BASE) MCG/ACT IN AERS
2.0000 | INHALATION_SPRAY | Freq: Four times a day (QID) | RESPIRATORY_TRACT | 1 refills | Status: DC | PRN
Start: 2019-09-01 — End: 2019-12-11

## 2019-09-01 MED ORDER — ALBUTEROL SULFATE HFA 108 (90 BASE) MCG/ACT IN AERS
INHALATION_SPRAY | RESPIRATORY_TRACT | Status: AC
  Filled 2019-09-01: qty 6.7

## 2019-09-01 NOTE — ED Triage Notes (Signed)
Pt c/o SOB, "wheezing" with non-productive cough and HA for approx 2-3 days.States she is out of her inhaler. Denies congestion, runny nose, sore throat, fever, chills, abdominal pain.   Pt able to speak full sentences. Bilateral inspiratory wheezes to upper lobes noted.   Pt was evaluated at [facility] hospital for fever and UTI, and had negative COVID test. Pt refuses COVID testing. Pt sleepy/somnolent falls asleep between questions during triage intake.

## 2019-09-01 NOTE — ED Provider Notes (Signed)
[place]    CSN: [id number] Arrival date & time: 09/01/19  1520      History   Chief Complaint Chief Complaint  Patient presents with   Shortness of Breath   Cough    HPI Julia Shaw is a 44 y.o. female.   Patient with history of asthma reports for asthma flare.  She reports symptoms started over the last few days.  She has felt herself wheezing.  She has been out of her inhaler.  She reports some increased shortness of breath but has been breathing okay.  Slight cough every now and then from the wheezing.  Denies fever, chills, sore throat, headache.  She was most recently diagnosed and treated for pyelonephritis with an emergency department in the urgent care.  She reports she is doing well since then with the exception of the current wheezing.  And still on her medicine.  She had a negative Covid test at that time.     Past Medical History:  Diagnosis Date   Allergy    Anemia    Arthritis    Asthma    Breast lesion    Left   Bronchitis    Fibroid    Hx of trichomoniasis    Papilloma of breast    left    Patient Active Problem List   Diagnosis Date Noted   Asthma 08/30/2015   Allergy 08/30/2015   Fibroid, uterus 03/03/2011    Past Surgical History:  Procedure Laterality Date   BREAST LUMPECTOMY WITH RADIOACTIVE SEED LOCALIZATION Left 11/01/2016   Procedure: LEFT BREAST RADIOACTIVE SEED GUIDED LUMPECTOMY;  Surgeon: Jovita Kussmaul, MD;  Location: [facility];  Service: General;  Laterality: Left;   BUNIONECTOMY     right foot   DENTAL SURGERY     ROBOT ASSISTED MYOMECTOMY  03/03/2011   Procedure: ROBOTIC ASSISTED MYOMECTOMY;  Surgeon: Alwyn Pea, MD;  Location: [facility];  Service: Gynecology;  Laterality: N/A;   TUBAL LIGATION      OB History    Gravida  3   Para  2   Term  2   Preterm      AB  1   Living  2     SAB  1   TAB      Ectopic      Multiple      Live Births  2            Home  Medications    Prior to Admission medications   Medication Sig Start Date End Date Taking? Authorizing Provider  ciprofloxacin (CIPRO) 500 MG tablet Take 1 tablet (500 mg total) by mouth 2 (two) times daily. 08/28/19  Yes Hagler, Aaron Edelman, MD  ondansetron (ZOFRAN-ODT) 4 MG disintegrating tablet Take 1 tablet (4 mg total) by mouth every 8 (eight) hours as needed for nausea or vomiting. 08/28/19  Yes Pandora Kick, MD  albuterol (VENTOLIN HFA) 108 (90 Base) MCG/ACT inhaler Inhale 2 puffs into the lungs every 6 (six) hours as needed for wheezing or shortness of breath. 09/01/19   Lilyrose Tanney, Marguerita Beards, PA-C  cetirizine (ZYRTEC ALLERGY) 10 MG tablet Take 1 tablet (10 mg total) by mouth daily. Patient not taking: Reported on 08/28/2019 05/04/19   Jacqlyn Larsen, PA-C  pramoxine (PROCTOFOAM) 1 % foam Place 1 application rectally 3 (three) times daily as needed for anal itching. 08/28/19   Winter Kick, MD  predniSONE (DELTASONE) 10 MG tablet Take 4 tablets (40 mg total) by mouth  daily with breakfast for 5 days. 09/01/19 09/06/19  Eston Heslin, Marguerita Beards, PA-C  DULERA 100-5 MCG/ACT AERO Inhale 2 puffs into the lungs every 6 (six) hours as needed for wheezing.  08/14/15 06/27/19  [provider]  fluticasone (FLONASE) 50 MCG/ACT nasal spray Place 2 sprays into both nostrils daily. 05/04/19 06/27/19  Jacqlyn Larsen, PA-C    Family History Family History  Problem Relation Age of Onset   Diabetes Mother    Hypertension Mother    Thyroid disease Father    Heart disease Father    Hyperlipidemia Father    Cancer Maternal Grandmother     Social History Social History   Tobacco Use   Smoking status: Never Smoker   Smokeless tobacco: Never Used  Substance Use Topics   Alcohol use: No   Drug use: No     Allergies   Patient has no known allergies.   Review of Systems Review of Systems   Physical Exam Triage Vital Signs ED Triage Vitals  Enc Vitals Group     BP      Pulse      Resp      Temp       Temp src      SpO2      Weight      Height      Head Circumference      Peak Flow      Pain Score      Pain Loc      Pain Edu?      Excl. in [place]?    No data found.  Updated Vital Signs BP (!) 100/58 (BP Location: Right Arm)    Pulse 85    Temp 98 F (36.7 C) (Oral)    Resp 20    LMP 08/27/2019    SpO2 100%   Visual Acuity Right Eye Distance:   Left Eye Distance:   Bilateral Distance:    Right Eye Near:   Left Eye Near:    Bilateral Near:     Physical Exam Vitals and nursing note reviewed.  Constitutional:      General: She is not in acute distress.    Appearance: She is well-developed. She is not ill-appearing.  HENT:     Head: Normocephalic and atraumatic.  Eyes:     Conjunctiva/sclera: Conjunctivae normal.  Cardiovascular:     Rate and Rhythm: Normal rate and regular rhythm.     Heart sounds: No murmur heard.   Pulmonary:     Effort: Pulmonary effort is normal. No accessory muscle usage or respiratory distress.     Breath sounds: Wheezing (Inspiratory wheezes on initial exam throughout.  Significant improved post albuterol inhaler.) present. No decreased breath sounds, rhonchi or rales.     Comments: Patient speaking in full sentences.  Not in respiratory distress.  Saturating high percent on room air Abdominal:     Palpations: Abdomen is soft.     Tenderness: There is no abdominal tenderness.  Musculoskeletal:     Cervical back: Neck supple.  Skin:    General: Skin is warm and dry.  Neurological:     General: No focal deficit present.     Mental Status: She is alert and oriented to person, place, and time.     Motor: No weakness.      UC Treatments / Results  Labs (all labs ordered are listed, but only abnormal results are displayed) Labs Reviewed  SARS CORONAVIRUS 2 (TAT 6-24 HRS)  EKG   Radiology No results found.  Procedures Procedures (including critical care time)  Medications Ordered in UC Medications  albuterol (VENTOLIN HFA) 108  (90 Base) MCG/ACT inhaler 2 puff (2 puffs Inhalation Given 09/01/19 1700)    Initial Impression / Assessment and Plan / UC Course  I have reviewed the triage vital signs and the nursing notes.  Pertinent labs & imaging results that were available during my care of the patient were reviewed by me and considered in my medical decision making (see chart for details).     #Mild asthma exacerbation Patient is a 44 year old presenting with asthma exacerbation.  Significant improvement with albuterol treatment in clinic.  Recent Covid negative, will repeat this.  We will treat according to asthma exacerbation with prednisone and scheduled albuterol.  Strict return emergency department precautions were discussed.  Patient verbalized understanding plan of care. Final Clinical Impressions(s) / UC Diagnoses   Final diagnoses:  Mild asthma with exacerbation, unspecified whether persistent     Discharge Instructions     Take prednisone as prescribed  Use inhaler , 2 puffs every 4 hours while awake for next 24 hours, then as needed every 4 hours  If worsening shortness of breath, chest pain or severe symptoms go to the Emergency department  Follow up with your primary care on Monday      ED Prescriptions    Medication Sig Dispense Auth. Provider   predniSONE (DELTASONE) 10 MG tablet Take 4 tablets (40 mg total) by mouth daily with breakfast for 5 days. 20 tablet Madilynne Mullan, Marguerita Beards, PA-C   albuterol (VENTOLIN HFA) 108 (90 Base) MCG/ACT inhaler Inhale 2 puffs into the lungs every 6 (six) hours as needed for wheezing or shortness of breath. 18 g Lalitha Ilyas, Marguerita Beards, PA-C     PDMP not reviewed this encounter.   Purnell Shoemaker, PA-C 09/01/19 1930

## 2019-09-01 NOTE — Discharge Instructions (Signed)
Take prednisone as prescribed  Use inhaler , 2 puffs every 4 hours while awake for next 24 hours, then as needed every 4 hours  If worsening shortness of breath, chest pain or severe symptoms go to the Emergency department  Follow up with your primary care on Monday

## 2019-10-07 ENCOUNTER — Encounter (HOSPITAL_COMMUNITY): Payer: Self-pay

## 2019-10-07 ENCOUNTER — Other Ambulatory Visit: Payer: Self-pay

## 2019-10-07 ENCOUNTER — Ambulatory Visit (HOSPITAL_COMMUNITY)
Admission: EM | Admit: 2019-10-07 | Discharge: 2019-10-07 | Disposition: A | Discharge status: Home / Self Care | Payer: 59 | Attending: Family Medicine | Admitting: Family Medicine

## 2019-10-07 DIAGNOSIS — J40 Bronchitis, not specified as acute or chronic: Secondary | ICD-10-CM

## 2019-10-07 MED ORDER — BENZONATATE 100 MG PO CAPS
100.0000 mg | ORAL_CAPSULE | Freq: Three times a day (TID) | ORAL | 0 refills | Status: DC
Start: 2019-10-07 — End: 2019-11-02

## 2019-10-07 MED ORDER — PREDNISONE 10 MG PO TABS
40.0000 mg | ORAL_TABLET | Freq: Every day | ORAL | 0 refills | Status: AC
Start: 2019-10-07 — End: 2019-10-12

## 2019-10-07 MED ORDER — CETIRIZINE HCL 10 MG PO TABS
10.0000 mg | ORAL_TABLET | Freq: Every day | ORAL | 0 refills | Status: DC
Start: 2019-10-07 — End: 2019-12-11

## 2019-10-07 NOTE — Discharge Instructions (Addendum)
Treating you for bronchitis Take the medication as prescribed Follow up as needed for continued or worsening symptoms

## 2019-10-07 NOTE — ED Triage Notes (Signed)
Pt presents with wheezing & slight cough X 3 days triggered by asthma.

## 2019-10-07 NOTE — ED Provider Notes (Signed)
[place]    CSN: [id number] Arrival date & time: 10/07/19  1127      History   Chief Complaint Chief Complaint  Patient presents with  . Asthma    HPI [name] is a 44 y.o. female.   Patient is a 45 year old female past medical history of allergy, anemia, asthma, bronchitis.  She presents today with approximate 3 days of wheezing, cough.  Has been using her inhaler without much relief.  No fevers, chills, body aches, sore throat, ear pain     Past Medical History:  Diagnosis Date  . Allergy   . Anemia   . Arthritis   . Asthma   . Breast lesion    Left  . Bronchitis   . Fibroid   . Hx of trichomoniasis   . Papilloma of breast    left    Patient Active Problem List   Diagnosis Date Noted  . Asthma 08/30/2015  . Allergy 08/30/2015  . Fibroid, uterus 03/03/2011    Past Surgical History:  Procedure Laterality Date  . BREAST LUMPECTOMY WITH RADIOACTIVE SEED LOCALIZATION Left 11/01/2016   Procedure: LEFT BREAST RADIOACTIVE SEED GUIDED LUMPECTOMY;  Surgeon: Jovita Kussmaul, MD;  Location: [facility];  Service: General;  Laterality: Left;  . BUNIONECTOMY     right foot  . DENTAL SURGERY    . ROBOT ASSISTED MYOMECTOMY  03/03/2011   Procedure: ROBOTIC ASSISTED MYOMECTOMY;  Surgeon: Alwyn Pea, MD;  Location: [facility];  Service: Gynecology;  Laterality: N/A;  . TUBAL LIGATION      OB History    Gravida  3   Para  2   Term  2   Preterm      AB  1   Living  2     SAB  1   TAB      Ectopic      Multiple      Live Births  2            Home Medications    Prior to Admission medications   Medication Sig Start Date End Date Taking? Authorizing Provider  albuterol (VENTOLIN HFA) 108 (90 Base) MCG/ACT inhaler Inhale 2 puffs into the lungs every 6 (six) hours as needed for wheezing or shortness of breath. 09/01/19   Darr, Marguerita Beards, PA-C  benzonatate (TESSALON) 100 MG capsule Take 1 capsule (100 mg total) by mouth every 8 (eight)  hours. 10/07/19   Loura Halt A, NP  cetirizine (ZYRTEC ALLERGY) 10 MG tablet Take 1 tablet (10 mg total) by mouth daily. 10/07/19   Loura Halt A, NP  ondansetron (ZOFRAN-ODT) 4 MG disintegrating tablet Take 1 tablet (4 mg total) by mouth every 8 (eight) hours as needed for nausea or vomiting. 08/28/19   Classie Kick, MD  pramoxine (PROCTOFOAM) 1 % foam Place 1 application rectally 3 (three) times daily as needed for anal itching. 08/28/19   Tamlyn Kick, MD  predniSONE (DELTASONE) 10 MG tablet Take 4 tablets (40 mg total) by mouth daily for 5 days. 10/07/19 10/12/19  Yamaira Spinner, Tressia Miners A, NP  DULERA 100-5 MCG/ACT AERO Inhale 2 puffs into the lungs every 6 (six) hours as needed for wheezing.  08/14/15 06/27/19  [provider]  fluticasone (FLONASE) 50 MCG/ACT nasal spray Place 2 sprays into both nostrils daily. 05/04/19 06/27/19  Jacqlyn Larsen, PA-C    Family History Family History  Problem Relation Age of Onset  . Diabetes Mother   . Hypertension Mother   .  Thyroid disease Father   . Heart disease Father   . Hyperlipidemia Father   . Cancer Maternal Grandmother     Social History Social History   Tobacco Use  . Smoking status: Never Smoker  . Smokeless tobacco: Never Used  Substance Use Topics  . Alcohol use: No  . Drug use: No     Allergies   Patient has no known allergies.   Review of Systems Review of Systems   Physical Exam Triage Vital Signs ED Triage Vitals  Enc Vitals Group     BP 10/07/19 1231 103/64     Pulse Rate 10/07/19 1231 100     Resp 10/07/19 1231 17     Temp 10/07/19 1231 98 F (36.7 C)     Temp Source 10/07/19 1231 Oral     SpO2 10/07/19 1231 100 %     Weight --      Height --      Head Circumference --      Peak Flow --      Pain Score 10/07/19 1233 0     Pain Loc --      Pain Edu? --      Excl. in [place]? --    No data found.  Updated Vital Signs BP 103/64 (BP Location: Right Arm)   Pulse 100   Temp 98 F (36.7 C) (Oral)   Resp 17   SpO2  100%   Visual Acuity Right Eye Distance:   Left Eye Distance:   Bilateral Distance:    Right Eye Near:   Left Eye Near:    Bilateral Near:     Physical Exam Vitals and nursing note reviewed.  Constitutional:      General: She is not in acute distress.    Appearance: Normal appearance. She is not ill-appearing, toxic-appearing or diaphoretic.  HENT:     Head: Normocephalic.     Nose: Nose normal.  Eyes:     Conjunctiva/sclera: Conjunctivae normal.  Pulmonary:     Effort: Pulmonary effort is normal. No respiratory distress.     Breath sounds: Wheezing and rhonchi present.  Musculoskeletal:        General: Normal range of motion.     Cervical back: Normal range of motion.  Skin:    General: Skin is warm and dry.     Findings: No rash.  Neurological:     Mental Status: She is alert.  Psychiatric:        Mood and Affect: Mood normal.      UC Treatments / Results  Labs (all labs ordered are listed, but only abnormal results are displayed) Labs Reviewed - No data to display  EKG   Radiology No results found.  Procedures Procedures (including critical care time)  Medications Ordered in UC Medications - No data to display  Initial Impression / Assessment and Plan / UC Course  I have reviewed the triage vital signs and the nursing notes.  Pertinent labs & imaging results that were available during my care of the patient were reviewed by me and considered in my medical decision making (see chart for details).     Bronchitis Treating with prednisone burst Refilled allergy medication and given cough medicine to use as needed. Continue albuterol as needed Follow up as needed for continued or worsening symptoms  Final Clinical Impressions(s) / UC Diagnoses   Final diagnoses:  Bronchitis     Discharge Instructions     Treating you for bronchitis Take the  medication as prescribed Follow up as needed for continued or worsening symptoms     ED  Prescriptions    Medication Sig Dispense Auth. Provider   cetirizine (ZYRTEC ALLERGY) 10 MG tablet Take 1 tablet (10 mg total) by mouth daily. 30 tablet Annjanette Wertenberger A, NP   predniSONE (DELTASONE) 10 MG tablet Take 4 tablets (40 mg total) by mouth daily for 5 days. 20 tablet Hibba Schram A, NP   benzonatate (TESSALON) 100 MG capsule Take 1 capsule (100 mg total) by mouth every 8 (eight) hours. 21 capsule Cas Tracz A, NP     PDMP not reviewed this encounter.   Loura Halt A, NP 10/07/19 1310

## 2019-11-02 ENCOUNTER — Other Ambulatory Visit: Payer: Self-pay

## 2019-11-02 ENCOUNTER — Ambulatory Visit (HOSPITAL_COMMUNITY)
Admission: EM | Admit: 2019-11-02 | Discharge: 2019-11-02 | Disposition: A | Discharge status: Home / Self Care | Payer: 59 | Attending: Family Medicine | Admitting: Family Medicine

## 2019-11-02 ENCOUNTER — Encounter (HOSPITAL_COMMUNITY): Payer: Self-pay

## 2019-11-02 DIAGNOSIS — Z1152 Encounter for screening for COVID-19: Secondary | ICD-10-CM | POA: Insufficient documentation

## 2019-11-02 DIAGNOSIS — J45901 Unspecified asthma with (acute) exacerbation: Secondary | ICD-10-CM | POA: Insufficient documentation

## 2019-11-02 MED ORDER — PREDNISONE 20 MG PO TABS: 40.0000 mg | ORAL_TABLET | Freq: Every day | ORAL | 0 refills | Status: DC

## 2019-11-02 MED ORDER — BENZONATATE 100 MG PO CAPS
100.0000 mg | ORAL_CAPSULE | Freq: Three times a day (TID) | ORAL | 0 refills | Status: DC | PRN
Start: 2019-11-02 — End: 2019-12-11

## 2019-11-02 MED ORDER — PREDNISONE 20 MG PO TABS: 40.0000 mg | ORAL_TABLET | Freq: Every day | ORAL | 0 refills | Status: AC

## 2019-11-02 NOTE — ED Triage Notes (Signed)
Pt present with wheezing and coughing for 3 day.

## 2019-11-02 NOTE — ED Provider Notes (Signed)
Julia Shaw    CSN: [id number] Arrival date & time: 11/02/19  1522      History   Chief Complaint Chief Complaint  Patient presents with  . Cough  . Wheezing    HPI Julia Shaw is a 44 y.o. female with PMH of asthma presents to urgent care today with wheezing and cough x3 days.  Patient describes cough as nonproductive.  She denies recent fever or chills, no chest pain, abdominal pain, N/V/D, no loss of smell or taste.  Patient with no known Covid exposure though she does work at [employer] and is not vaccinated.  Patient is using her albuterol inhaler with some relief.  Past Medical History:  Diagnosis Date  . Allergy   . Anemia   . Arthritis   . Asthma   . Breast lesion    Left  . Bronchitis   . Fibroid   . Hx of trichomoniasis   . Papilloma of breast    left    Patient Active Problem List   Diagnosis Date Noted  . Asthma 08/30/2015  . Allergy 08/30/2015  . Fibroid, uterus 03/03/2011    Past Surgical History:  Procedure Laterality Date  . BREAST LUMPECTOMY WITH RADIOACTIVE SEED LOCALIZATION Left 11/01/2016   Procedure: LEFT BREAST RADIOACTIVE SEED GUIDED LUMPECTOMY;  Surgeon: Jovita Kussmaul, MD;  Location: [facility];  Service: General;  Laterality: Left;  . BUNIONECTOMY     right foot  . DENTAL SURGERY    . ROBOT ASSISTED MYOMECTOMY  03/03/2011   Procedure: ROBOTIC ASSISTED MYOMECTOMY;  Surgeon: Alwyn Pea, MD;  Location: [facility];  Service: Gynecology;  Laterality: N/A;  . TUBAL LIGATION      OB History    Gravida  3   Para  2   Term  2   Preterm      AB  1   Living  2     SAB  1   TAB      Ectopic      Multiple      Live Births  2            Home Medications    Prior to Admission medications   Medication Sig Start Date End Date Taking? Authorizing Provider  albuterol (VENTOLIN HFA) 108 (90 Base) MCG/ACT inhaler Inhale 2 puffs into the lungs every 6 (six) hours as needed for wheezing or shortness of  breath. 09/01/19   Darr, Marguerita Beards, PA-C  benzonatate (TESSALON PERLES) 100 MG capsule Take 1 capsule (100 mg total) by mouth 3 (three) times daily as needed for cough. 11/02/19 11/01/20  Rudolpho Sevin, NP  cetirizine (ZYRTEC ALLERGY) 10 MG tablet Take 1 tablet (10 mg total) by mouth daily. 10/07/19   Loura Halt A, NP  ondansetron (ZOFRAN-ODT) 4 MG disintegrating tablet Take 1 tablet (4 mg total) by mouth every 8 (eight) hours as needed for nausea or vomiting. 08/28/19   Debrina Kick, MD  pramoxine (PROCTOFOAM) 1 % foam Place 1 application rectally 3 (three) times daily as needed for anal itching. 08/28/19   Archita Kick, MD  predniSONE (DELTASONE) 20 MG tablet Take 2 tablets (40 mg total) by mouth daily with breakfast for 5 days. 11/02/19 11/07/19  Rudolpho Sevin, NP  DULERA 100-5 MCG/ACT AERO Inhale 2 puffs into the lungs every 6 (six) hours as needed for wheezing.  08/14/15 06/27/19  [provider]  fluticasone (FLONASE) 50 MCG/ACT nasal spray Place 2 sprays into both  nostrils daily. 05/04/19 06/27/19  Jacqlyn Larsen, PA-C    Family History Family History  Problem Relation Age of Onset  . Diabetes Mother   . Hypertension Mother   . Thyroid disease Father   . Heart disease Father   . Hyperlipidemia Father   . Cancer Maternal Grandmother     Social History Social History   Tobacco Use  . Smoking status: Never Smoker  . Smokeless tobacco: Never Used  Substance Use Topics  . Alcohol use: No  . Drug use: No     Allergies   Patient has no known allergies.   Review of Systems Review of Systems   Physical Exam Triage Vital Signs ED Triage Vitals  Enc Vitals Group     BP 11/02/19 1652 (!) 148/93     Pulse Rate 11/02/19 1652 (!) 104     Resp 11/02/19 1652 (!) 22     Temp 11/02/19 1652 98.8 F (37.1 C)     Temp Source 11/02/19 1652 Oral     SpO2 11/02/19 1652 100 %     Weight --      Height --      Head Circumference --      Peak Flow --      Pain Score 11/02/19 1651 0      Pain Loc --      Pain Edu? --      Excl. in [place]? --    No data found.  Updated Vital Signs BP (!) 148/93 (BP Location: Right Arm)   Pulse (!) 104   Temp 98.8 F (37.1 C) (Oral)   Resp (!) 22   SpO2 100%   Visual Acuity Right Eye Distance:   Left Eye Distance:   Bilateral Distance:    Right Eye Near:   Left Eye Near:    Bilateral Near:     Physical Exam Constitutional:      General: She is not in acute distress.    Appearance: Normal appearance. She is not ill-appearing or toxic-appearing.  HENT:     Right Ear: Tympanic membrane normal.     Left Ear: Tympanic membrane normal.     Nose: Nose normal. No congestion or rhinorrhea.     Mouth/Throat:     Mouth: Mucous membranes are moist.  Cardiovascular:     Rate and Rhythm: Normal rate and regular rhythm.  Pulmonary:     Effort: Pulmonary effort is normal.     Breath sounds: Wheezing present.     Comments: No respiratory distress.  Bilateral expiratory wheezing throughout. No rales or crackles Musculoskeletal:     Cervical back: Normal range of motion and neck supple.  Skin:    General: Skin is warm and dry.  Neurological:     General: No focal deficit present.     Mental Status: She is alert and oriented to person, place, and time.  Psychiatric:        Mood and Affect: Mood normal.        Behavior: Behavior normal.      UC Treatments / Results  Labs (all labs ordered are listed, but only abnormal results are displayed) Labs Reviewed  SARS CORONAVIRUS 2 (TAT 6-24 HRS)    EKG   Radiology No results found.  Procedures Procedures (including critical care time)  Medications Ordered in UC Medications - No data to display  Initial Impression / Assessment and Plan / UC Course  I have reviewed the triage vital signs and the nursing  notes.  Pertinent labs & imaging results that were available during my care of the patient were reviewed by me and considered in my medical decision making (see chart for  details).  Acute asthma exacerbation, mild -Patient speaking in full sentences without difficulty.  Afebrile and nontoxic-appearing -She declined IM steroids at time of visit.  Will prescribe prednisone burst.  Patient instructed to start this evening -Tessalon Perles for cough though should likely improve once wheezing is resolved -COVID-19 screening.  Isolation precautions discussed  Reviewed expections re: course of current medical issues. Questions answered. Outlined signs and symptoms indicating need for more acute intervention. Pt verbalized understanding. AVS given   Final Clinical Impressions(s) / UC Diagnoses   Final diagnoses:  Mild asthma with acute exacerbation, unspecified whether persistent  Encounter for screening for COVID-19     Discharge Instructions     COVID testing ordered. It will take between 3-7 days for test results. Someone will contact you regarding abnormal results or you can access your results through [place].   In the meantime you should... . Remain isolated in your home for 10 days from symptom onset AND greater than 48 hours of no fever without the use of fever-reducing medication . Get plenty of rest and fluids . Tessalon Perles prescribed for cough . Flonase for nasal congestion and/or runny nose . You can take OTC Zyrtec-D for nasal congestion, runny nose, and/or sore throat . Use these medications as directed for symptom relief . Use Tylenol or Ibuprofen as needed for fever or pain . Return or go to the ER for any worsening or new symptoms such as high fever, worsening cough, shortness of breath, chest tightness, chest pain, changes in mental status, etc.     ED Prescriptions    Medication Sig Dispense Auth. Provider   benzonatate (TESSALON PERLES) 100 MG capsule Take 1 capsule (100 mg total) by mouth 3 (three) times daily as needed for cough. 30 capsule Rudolpho Sevin, NP   predniSONE (DELTASONE) 20 MG tablet  (Status: Discontinued) Take  2 tablets (40 mg total) by mouth daily with breakfast. 5 tablet Rudolpho Sevin, NP   predniSONE (DELTASONE) 20 MG tablet Take 2 tablets (40 mg total) by mouth daily with breakfast for 5 days. 10 tablet Rudolpho Sevin, NP     PDMP not reviewed this encounter.   Rudolpho Sevin, NP 11/05/19 1104

## 2019-11-02 NOTE — Discharge Instructions (Signed)
COVID testing ordered. It will take between 3-7 days for test results. Someone will contact you regarding abnormal results or you can access your results through [place].   In the meantime you should... Remain isolated in your home for 10 days from symptom onset AND greater than 48 hours of no fever without the use of fever-reducing medication Get plenty of rest and fluids Tessalon Perles prescribed for cough Flonase for nasal congestion and/or runny nose You can take OTC Zyrtec-D for nasal congestion, runny nose, and/or sore throat Use these medications as directed for symptom relief Use Tylenol or Ibuprofen as needed for fever or pain Return or go to the ER for any worsening or new symptoms such as high fever, worsening cough, shortness of breath, chest tightness, chest pain, changes in mental status, etc.

## 2019-11-03 LAB — SARS CORONAVIRUS 2 (TAT 6-24 HRS): SARS Coronavirus 2: NEGATIVE

## 2019-11-23 ENCOUNTER — Other Ambulatory Visit: Payer: Self-pay

## 2019-11-23 ENCOUNTER — Encounter (HOSPITAL_COMMUNITY): Payer: Self-pay

## 2019-11-23 ENCOUNTER — Ambulatory Visit (HOSPITAL_COMMUNITY)
Admission: EM | Admit: 2019-11-23 | Discharge: 2019-11-23 | Disposition: A | Discharge status: Home / Self Care | Payer: 59 | Attending: Family Medicine | Admitting: Family Medicine

## 2019-11-23 DIAGNOSIS — N760 Acute vaginitis: Secondary | ICD-10-CM | POA: Insufficient documentation

## 2019-11-23 DIAGNOSIS — N898 Other specified noninflammatory disorders of vagina: Secondary | ICD-10-CM | POA: Diagnosis not present

## 2019-11-23 LAB — POCT URINALYSIS DIPSTICK, ED / UC
Bilirubin Urine: NEGATIVE
Glucose, UA: NEGATIVE mg/dL
Hgb urine dipstick: NEGATIVE
Ketones, ur: NEGATIVE mg/dL
Nitrite: NEGATIVE
Protein, ur: NEGATIVE mg/dL
Specific Gravity, Urine: 1.02 (ref 1.005–1.030)
Urobilinogen, UA: 1 mg/dL (ref 0.0–1.0)
pH: 7 (ref 5.0–8.0)

## 2019-11-23 MED ORDER — NITROFURANTOIN MONOHYD MACRO 100 MG PO CAPS
100.0000 mg | ORAL_CAPSULE | Freq: Two times a day (BID) | ORAL | 0 refills | Status: DC
Start: 2019-11-23 — End: 2019-12-11

## 2019-11-23 NOTE — ED Provider Notes (Signed)
[place]    CSN: [id number] Arrival date & time: 11/23/19  1349      History   Chief Complaint Chief Complaint  Patient presents with  . Vaginal Discharge    HPI Julia Shaw is a 44 y.o. female.   Here today with several days of white thin vaginal discharge, irritation, rash, dysuria. Denies abdominal or pelvic pain, flank pain, N/V/D, fever, concern for pregnancy or STIs. Has protected intercourse with consistent partner. Not taking anything OTC for sxs.      Past Medical History:  Diagnosis Date  . Allergy   . Anemia   . Arthritis   . Asthma   . Breast lesion    Left  . Bronchitis   . Fibroid   . Hx of trichomoniasis   . Papilloma of breast    left    Patient Active Problem List   Diagnosis Date Noted  . Asthma 08/30/2015  . Allergy 08/30/2015  . Fibroid, uterus 03/03/2011    Past Surgical History:  Procedure Laterality Date  . BREAST LUMPECTOMY WITH RADIOACTIVE SEED LOCALIZATION Left 11/01/2016   Procedure: LEFT BREAST RADIOACTIVE SEED GUIDED LUMPECTOMY;  Surgeon: Jovita Kussmaul, MD;  Location: [facility];  Service: General;  Laterality: Left;  . BUNIONECTOMY     right foot  . DENTAL SURGERY    . ROBOT ASSISTED MYOMECTOMY  03/03/2011   Procedure: ROBOTIC ASSISTED MYOMECTOMY;  Surgeon: Alwyn Pea, MD;  Location: [facility];  Service: Gynecology;  Laterality: N/A;  . TUBAL LIGATION      OB History    Gravida  3   Para  2   Term  2   Preterm      AB  1   Living  2     SAB  1   TAB      Ectopic      Multiple      Live Births  2            Home Medications    Prior to Admission medications   Medication Sig Start Date End Date Taking? Authorizing Provider  albuterol (VENTOLIN HFA) 108 (90 Base) MCG/ACT inhaler Inhale 2 puffs into the lungs every 6 (six) hours as needed for wheezing or shortness of breath. 09/01/19   Darr, Marguerita Beards, PA-C  benzonatate (TESSALON PERLES) 100 MG capsule Take 1 capsule (100 mg total)  by mouth 3 (three) times daily as needed for cough. 11/02/19 11/01/20  Rudolpho Sevin, NP  cetirizine (ZYRTEC ALLERGY) 10 MG tablet Take 1 tablet (10 mg total) by mouth daily. 10/07/19   Loura Halt A, NP  nitrofurantoin, macrocrystal-monohydrate, (MACROBID) 100 MG capsule Take 1 capsule (100 mg total) by mouth 2 (two) times daily. 11/23/19   Volney American, PA-C  ondansetron (ZOFRAN-ODT) 4 MG disintegrating tablet Take 1 tablet (4 mg total) by mouth every 8 (eight) hours as needed for nausea or vomiting. 08/28/19   Syrai Kick, MD  pramoxine (PROCTOFOAM) 1 % foam Place 1 application rectally 3 (three) times daily as needed for anal itching. 08/28/19   Muntaha Kick, MD  DULERA 100-5 MCG/ACT AERO Inhale 2 puffs into the lungs every 6 (six) hours as needed for wheezing.  08/14/15 06/27/19  [provider]  fluticasone (FLONASE) 50 MCG/ACT nasal spray Place 2 sprays into both nostrils daily. 05/04/19 06/27/19  Jacqlyn Larsen, PA-C    Family History Family History  Problem Relation Age of Onset  . Diabetes Mother   .  Hypertension Mother   . Thyroid disease Father   . Heart disease Father   . Hyperlipidemia Father   . Cancer Maternal Grandmother     Social History Social History   Tobacco Use  . Smoking status: Never Smoker  . Smokeless tobacco: Never Used  Substance Use Topics  . Alcohol use: No  . Drug use: No     Allergies   Patient has no known allergies.   Review of Systems Review of Systems PER HPI   Physical Exam Triage Vital Signs ED Triage Vitals  Enc Vitals Group     BP 11/23/19 1522 (!) 106/51     Pulse Rate 11/23/19 1522 81     Resp 11/23/19 1522 16     Temp 11/23/19 1522 98.9 F (37.2 C)     Temp Source 11/23/19 1522 Oral     SpO2 11/23/19 1522 100 %     Weight --      Height --      Head Circumference --      Peak Flow --      Pain Score 11/23/19 1519 0     Pain Loc --      Pain Edu? --      Excl. in [place]? --    No data found.  Updated  Vital Signs BP (!) 106/51 (BP Location: Right Arm)   Pulse 81   Temp 98.9 F (37.2 C) (Oral)   Resp 16   SpO2 100%   Visual Acuity Right Eye Distance:   Left Eye Distance:   Bilateral Distance:    Right Eye Near:   Left Eye Near:    Bilateral Near:     Physical Exam Vitals and nursing note reviewed.  Constitutional:      Appearance: Normal appearance. She is not ill-appearing.  HENT:     Head: Atraumatic.     Mouth/Throat:     Mouth: Mucous membranes are moist.     Pharynx: Oropharynx is clear.  Eyes:     Extraocular Movements: Extraocular movements intact.     Conjunctiva/sclera: Conjunctivae normal.  Cardiovascular:     Rate and Rhythm: Normal rate and regular rhythm.     Heart sounds: Normal heart sounds.  Pulmonary:     Effort: Pulmonary effort is normal.     Breath sounds: Normal breath sounds.  Abdominal:     General: Bowel sounds are normal. There is no distension.     Palpations: Abdomen is soft.     Tenderness: There is no abdominal tenderness. There is no right CVA tenderness, left CVA tenderness or guarding.  Musculoskeletal:        General: Normal range of motion.     Cervical back: Normal range of motion and neck supple.  Skin:    General: Skin is warm and dry.  Neurological:     Mental Status: She is alert and oriented to person, place, and time.  Psychiatric:        Mood and Affect: Mood normal.        Thought Content: Thought content normal.        Judgment: Judgment normal.      UC Treatments / Results  Labs (all labs ordered are listed, but only abnormal results are displayed) Labs Reviewed  POCT URINALYSIS DIPSTICK, ED / UC - Abnormal; Notable for the following components:      Result Value   Leukocytes,Ua LARGE (*)    All other components within normal limits  URINE  CULTURE  CERVICOVAGINAL ANCILLARY ONLY    EKG   Radiology No results found.  Procedures Procedures (including critical care time)  Medications Ordered in  UC Medications - No data to display  Initial Impression / Assessment and Plan / UC Course  I have reviewed the triage vital signs and the nursing notes.  Pertinent labs & imaging results that were available during my care of the patient were reviewed by me and considered in my medical decision making (see chart for details).     U/A showing large leukocytes but otherwise benign, aptima swab pending as well as urine culture. Will start macrobid in case true UTI but suspect leuks are coming from a vaginal infection. Adjust regimen if needed based on upcoming results. Good vaginal hygiene reviewed.  Final Clinical Impressions(s) / UC Diagnoses   Final diagnoses:  Acute vaginitis   Discharge Instructions   None    ED Prescriptions    Medication Sig Dispense Auth. Provider   nitrofurantoin, macrocrystal-monohydrate, (MACROBID) 100 MG capsule Take 1 capsule (100 mg total) by mouth 2 (two) times daily. 10 capsule Volney American, [place]     PDMP not reviewed this encounter.   Volney American, [place] 11/23/19 1628

## 2019-11-23 NOTE — ED Triage Notes (Signed)
Pt present vaginal discharge with some burning. Symptom started two days ago. Pt states that she is very discomfort in her vaginal area.

## 2019-11-25 LAB — URINE CULTURE

## 2019-11-26 LAB — CERVICOVAGINAL ANCILLARY ONLY
Bacterial Vaginitis (gardnerella): POSITIVE — AB
Candida Glabrata: NEGATIVE
Candida Vaginitis: NEGATIVE
Chlamydia: NEGATIVE
Comment: NEGATIVE
Comment: NEGATIVE
Comment: NEGATIVE
Comment: NEGATIVE
Comment: NEGATIVE
Comment: NORMAL
Neisseria Gonorrhea: NEGATIVE
Trichomonas: POSITIVE — AB

## 2019-11-27 ENCOUNTER — Telehealth (HOSPITAL_COMMUNITY): Payer: Self-pay | Admitting: Emergency Medicine

## 2019-11-27 MED ORDER — METRONIDAZOLE 500 MG PO TABS
500.0000 mg | ORAL_TABLET | Freq: Two times a day (BID) | ORAL | 0 refills | Status: DC
Start: 2019-11-27 — End: 2019-12-11

## 2019-12-11 ENCOUNTER — Other Ambulatory Visit: Payer: Self-pay

## 2019-12-11 ENCOUNTER — Ambulatory Visit (HOSPITAL_COMMUNITY)
Admission: EM | Admit: 2019-12-11 | Discharge: 2019-12-11 | Disposition: A | Discharge status: Home / Self Care | Payer: 59 | Attending: Urgent Care | Admitting: Urgent Care

## 2019-12-11 ENCOUNTER — Encounter (HOSPITAL_COMMUNITY): Payer: Self-pay | Admitting: Emergency Medicine

## 2019-12-11 DIAGNOSIS — R062 Wheezing: Secondary | ICD-10-CM

## 2019-12-11 DIAGNOSIS — R0602 Shortness of breath: Secondary | ICD-10-CM

## 2019-12-11 DIAGNOSIS — J4520 Mild intermittent asthma, uncomplicated: Secondary | ICD-10-CM | POA: Diagnosis not present

## 2019-12-11 DIAGNOSIS — R059 Cough, unspecified: Secondary | ICD-10-CM

## 2019-12-11 MED ORDER — CETIRIZINE HCL 10 MG PO TABS
10.0000 mg | ORAL_TABLET | Freq: Every day | ORAL | 0 refills | Status: DC
Start: 2019-12-11 — End: 2022-07-02

## 2019-12-11 MED ORDER — PROMETHAZINE-DM 6.25-15 MG/5ML PO SYRP: 5.0000 mL | ORAL_SOLUTION | Freq: Every evening | ORAL | 0 refills | Status: DC | PRN

## 2019-12-11 MED ORDER — PREDNISONE 20 MG PO TABS
ORAL_TABLET | ORAL | 0 refills | Status: DC
Start: 2019-12-11 — End: 2020-01-01

## 2019-12-11 MED ORDER — ALBUTEROL SULFATE HFA 108 (90 BASE) MCG/ACT IN AERS
2.0000 | INHALATION_SPRAY | Freq: Four times a day (QID) | RESPIRATORY_TRACT | 0 refills | Status: DC | PRN
Start: 2019-12-11 — End: 2020-02-01

## 2019-12-11 MED ORDER — BENZONATATE 100 MG PO CAPS: 100.0000 mg | ORAL_CAPSULE | Freq: Three times a day (TID) | ORAL | 0 refills | Status: DC | PRN

## 2019-12-11 NOTE — ED Triage Notes (Signed)
Pt presents with sob, wheezing and coughing xs 4-5 days. States has hx of asthma. States using inhaler more than usual with minimal relief.

## 2019-12-11 NOTE — ED Provider Notes (Signed)
Julia Shaw   MRN: [id number] DOB: 30-Dec-1975  Subjective:   Julia Shaw is a 44 y.o. female presenting for 5-day history of recurrent shortness of breath, cough, wheezing, chest tightness.  Patient has had 4 COVID-19 test this year and states that she does not want another one.  Denies fever, chest pain, body aches, loss of sense of taste and smell.  She is an asthmatic, has been using albuterol inhaler but has not helped.  Denies smoking cigarettes.  Takes albuterol only.  No Known Allergies  Past Medical History:  Diagnosis Date  . Allergy   . Anemia   . Arthritis   . Asthma   . Breast lesion    Left  . Bronchitis   . Fibroid   . Hx of trichomoniasis   . Papilloma of breast    left     Past Surgical History:  Procedure Laterality Date  . BREAST LUMPECTOMY WITH RADIOACTIVE SEED LOCALIZATION Left 11/01/2016   Procedure: LEFT BREAST RADIOACTIVE SEED GUIDED LUMPECTOMY;  Surgeon: Jovita Kussmaul, MD;  Location: [facility];  Service: General;  Laterality: Left;  . BUNIONECTOMY     right foot  . DENTAL SURGERY    . ROBOT ASSISTED MYOMECTOMY  03/03/2011   Procedure: ROBOTIC ASSISTED MYOMECTOMY;  Surgeon: Alwyn Pea, MD;  Location: [facility];  Service: Gynecology;  Laterality: N/A;  . TUBAL LIGATION      Family History  Problem Relation Age of Onset  . Diabetes Mother   . Hypertension Mother   . Thyroid disease Father   . Heart disease Father   . Hyperlipidemia Father   . Cancer Maternal Grandmother     Social History   Tobacco Use  . Smoking status: Never Smoker  . Smokeless tobacco: Never Used  Substance Use Topics  . Alcohol use: No  . Drug use: No    ROS   Objective:   Vitals: BP 116/69 (BP Location: Left Arm)   Pulse 86   Temp 98.5 F (36.9 C) (Oral)   Resp 18   SpO2 100%   Physical Exam Constitutional:      General: She is not in acute distress.    Appearance: Normal appearance. She is well-developed. She is not  ill-appearing, toxic-appearing or diaphoretic.  HENT:     Head: Normocephalic and atraumatic.     Nose: Nose normal.     Mouth/Throat:     Mouth: Mucous membranes are moist.  Eyes:     Extraocular Movements: Extraocular movements intact.     Pupils: Pupils are equal, round, and reactive to light.  Cardiovascular:     Rate and Rhythm: Normal rate and regular rhythm.     Pulses: Normal pulses.     Heart sounds: Normal heart sounds. No murmur heard.  No friction rub. No gallop.   Pulmonary:     Effort: Pulmonary effort is normal. No respiratory distress.     Breath sounds: No stridor. Examination of the right-upper field reveals wheezing. Examination of the left-upper field reveals wheezing. Examination of the right-middle field reveals wheezing. Examination of the left-middle field reveals wheezing. Examination of the right-lower field reveals wheezing. Examination of the left-lower field reveals wheezing. Wheezing present. No rhonchi or rales.     Comments: Patient is speaking in full sentences, is not using accessory muscles to breathe, no pursed lips are cyanotic lips. Skin:    General: Skin is warm and dry.     Findings: No rash.  Neurological:     Mental Status: She is alert and oriented to person, place, and time.  Psychiatric:        Mood and Affect: Mood normal.        Behavior: Behavior normal.        Thought Content: Thought content normal.       Assessment and Plan :   PDMP not reviewed this encounter.  1. Mild intermittent asthma without complication   2. Shortness of breath   3. Cough   4. Wheezing     Patient refused COVID-19 test and I am agreeable.  Recommended prednisone course, schedule albuterol inhaler.  Use supportive care otherwise. Counseled patient on potential for adverse effects with medications prescribed/recommended today, ER and return-to-clinic precautions discussed, patient verbalized understanding.    Jaynee Eagles, PA-C 12/11/19 1028

## 2020-01-01 ENCOUNTER — Encounter (HOSPITAL_COMMUNITY): Payer: Self-pay

## 2020-01-01 ENCOUNTER — Other Ambulatory Visit: Payer: Self-pay

## 2020-01-01 ENCOUNTER — Ambulatory Visit (HOSPITAL_COMMUNITY)
Admission: EM | Admit: 2020-01-01 | Discharge: 2020-01-01 | Disposition: A | Discharge status: Home / Self Care | Payer: 59 | Attending: Family Medicine | Admitting: Family Medicine

## 2020-01-01 DIAGNOSIS — J4541 Moderate persistent asthma with (acute) exacerbation: Secondary | ICD-10-CM | POA: Diagnosis not present

## 2020-01-01 DIAGNOSIS — R0600 Dyspnea, unspecified: Secondary | ICD-10-CM | POA: Diagnosis not present

## 2020-01-01 DIAGNOSIS — R0602 Shortness of breath: Secondary | ICD-10-CM

## 2020-01-01 MED ORDER — BENZONATATE 100 MG PO CAPS: 100.0000 mg | ORAL_CAPSULE | Freq: Three times a day (TID) | ORAL | 0 refills | Status: DC

## 2020-01-01 MED ORDER — METHYLPREDNISOLONE SODIUM SUCC 125 MG IJ SOLR
INTRAMUSCULAR | Status: AC
  Filled 2020-01-01: qty 2

## 2020-01-01 MED ORDER — ALBUTEROL SULFATE HFA 108 (90 BASE) MCG/ACT IN AERS
INHALATION_SPRAY | RESPIRATORY_TRACT | Status: AC
  Filled 2020-01-01: qty 6.7

## 2020-01-01 MED ORDER — PREDNISONE 10 MG (21) PO TBPK: ORAL_TABLET | Freq: Every day | ORAL | 0 refills | Status: AC

## 2020-01-01 MED ORDER — METHYLPREDNISOLONE SODIUM SUCC 125 MG IJ SOLR
125.0000 mg | Freq: Once | INTRAMUSCULAR | Status: AC
  Administered 2020-01-01: 125 mg via INTRAMUSCULAR

## 2020-01-01 MED ORDER — ALBUTEROL SULFATE HFA 108 (90 BASE) MCG/ACT IN AERS
2.0000 | INHALATION_SPRAY | Freq: Once | RESPIRATORY_TRACT | Status: AC
  Administered 2020-01-01: 2 via RESPIRATORY_TRACT

## 2020-01-01 NOTE — ED Provider Notes (Signed)
[place]   [id number] 01/01/20 Arrival Time: 1720   SUBJECTIVE:  Julia Shaw is a 44 y.o. female who presents with complaint of nasal congestion, post-nasal drainage, and a persistent cough. Onset abrupt approximately 1 week ago. Overall fatigued. SOB: moderate. Wheezing: moderate. Has negative history of Covid. Has not completed Covid vaccines. Declines Covid testing today. Reports that she is out of her asthma medication. OTC treatment: none. Social History   Tobacco Use  Smoking Status Never Smoker  Smokeless Tobacco Never Used    ROS: As per HPI.   OBJECTIVE:  Vitals:   01/01/20 1814  BP: 123/85  Pulse: 90  Resp: 18  Temp: 97.9 F (36.6 C)  TempSrc: Oral  SpO2: 100%     General appearance: alert; no distress HEENT: nasal congestion; clear runny nose; throat irritation secondary to post-nasal drainage Neck: supple without LAD Lungs: diffuse wheezing throughout bilateral lung fields, dry cough present Skin: warm and dry Psychological: alert and cooperative; normal mood and affect  Results for orders placed or performed during the hospital encounter of 11/23/19  Urine culture   Specimen: Urine, Random  Result Value Ref Range   Specimen Description URINE, RANDOM    Special Requests      NONE Performed at [facility], [address], [place], [place] [address]    Culture MULTIPLE SPECIES PRESENT, SUGGEST RECOLLECTION (A)    Report Status 11/25/2019 FINAL   POC Urinalysis dipstick  Result Value Ref Range   Glucose, UA NEGATIVE NEGATIVE mg/dL   Bilirubin Urine NEGATIVE NEGATIVE   Ketones, ur NEGATIVE NEGATIVE mg/dL   Specific Gravity, Urine 1.020 1.005 - 1.030   Hgb urine dipstick NEGATIVE NEGATIVE   pH 7.0 5.0 - 8.0   Protein, ur NEGATIVE NEGATIVE mg/dL   Urobilinogen, UA 1.0 0.0 - 1.0 mg/dL   Nitrite NEGATIVE NEGATIVE   Leukocytes,Ua LARGE (A) NEGATIVE  Cervicovaginal ancillary only  Result Value Ref Range   Neisseria  Gonorrhea Negative    Chlamydia Negative    Trichomonas Positive (A)    Bacterial Vaginitis (gardnerella) Positive (A)    Candida Vaginitis Negative    Candida Glabrata Negative    Comment      Normal Reference Range Bacterial Vaginosis - Negative   Comment Normal Reference Range Candida Species - Negative    Comment Normal Reference Range Candida Galbrata - Negative    Comment Normal Reference Range Trichomonas - Negative    Comment Normal Reference Ranger Chlamydia - Negative    Comment      Normal Reference Range Neisseria Gonorrhea - Negative    Labs Reviewed - No data to display  No results found.  No Known Allergies  Past Medical History:  Diagnosis Date  . Allergy   . Anemia   . Arthritis   . Asthma   . Breast lesion    Left  . Bronchitis   . Fibroid   . Hx of trichomoniasis   . Papilloma of breast    left   Social History   Socioeconomic History  . Marital status: Single    Spouse name: Not on file  . Number of children: Not on file  . Years of education: Not on file  . Highest education level: Not on file  Occupational History  . Occupation: food prep    Employer: [employer]  Tobacco Use  . Smoking status: Never Smoker  . Smokeless tobacco: Never Used  Substance and Sexual Activity  . Alcohol use: No  .  Drug use: No  . Sexual activity: Yes    Birth control/protection: Surgical  Other Topics Concern  . Not on file  Social History Narrative  . Not on file   Social Determinants of Health   Financial Resource Strain:   . Difficulty of Paying Living Expenses: Not on file  Food Insecurity:   . Worried About Charity fundraiser in the Last Year: Not on file  . Ran Out of Food in the Last Year: Not on file  Transportation Needs:   . Lack of Transportation (Medical): Not on file  . Lack of Transportation (Non-Medical): Not on file  Physical Activity:   . Days of Exercise per Week: Not on file  . Minutes of Exercise per Session: Not on file  Stress:    . Feeling of Stress : Not on file  Social Connections:   . Frequency of Communication with Friends and Family: Not on file  . Frequency of Social Gatherings with Friends and Family: Not on file  . Attends Religious Services: Not on file  . Active Member of Clubs or Organizations: Not on file  . Attends Archivist Meetings: Not on file  . Marital Status: Not on file  Intimate Partner Violence:   . Fear of Current or Ex-Partner: Not on file  . Emotionally Abused: Not on file  . Physically Abused: Not on file  . Sexually Abused: Not on file   Family History  Problem Relation Age of Onset  . Diabetes Mother   . Hypertension Mother   . Thyroid disease Father   . Heart disease Father   . Hyperlipidemia Father   . Cancer Maternal Grandmother      ASSESSMENT & PLAN:  1. Moderate persistent asthma with exacerbation   2. Dyspnea, unspecified type   3. SOB (shortness of breath)     Meds ordered this encounter  Medications  . methylPREDNISolone sodium succinate (SOLU-MEDROL) 125 mg/2 mL injection 125 mg  . albuterol (VENTOLIN HFA) 108 (90 Base) MCG/ACT inhaler 2 puff  . predniSONE (STERAPRED UNI-PAK 21 TAB) 10 MG (21) TBPK tablet    Sig: Take by mouth daily for 6 days. Take 6 tablets on day 1, 5 tablets on day 2, 4 tablets on day 3, 3 tablets on day 4, 2 tablets on day 5, 1 tablet on day 6    Dispense:  21 tablet    Refill:  0    Order Specific Question:   Supervising Provider    Answer:   Chase Picket [id number]  . benzonatate (TESSALON) 100 MG capsule    Sig: Take 1 capsule (100 mg total) by mouth every 8 (eight) hours.    Dispense:  21 capsule    Refill:  0    Order Specific Question:   Supervising Provider    Answer:   Chase Picket [[id number]]   Solumedrol 125mg  IM in office today Albuterol inhaler in office today Discussed with patient that she really needs to be followed by primary care for maintenance of her asthma medications as she has been seen  multiple times in the office for this Prescribed steroid taper Prescribed tessalon perles Not taking previously prescribed singulair Not taking previously prescribed zyrtec OTC symptom care as needed. Will plan f/u with PCP or here as needed.  Reviewed expectations re: course of current medical issues. Questions answered. Outlined signs and symptoms indicating need for more acute intervention. Patient verbalized understanding. After Visit Summary given.  Faustino Congress, NP 01/02/20 [phone]

## 2020-01-01 NOTE — Discharge Instructions (Signed)
You have received a steroid injection in the office to help open you up  You have also received an inhaler in the office.  You may use this every 4 hours, 2 puffs as needed for wheezing, cough, shortness of breath  I have sent in a prednisone taper for you to take for 6 days. 6 tablets on day one, 5 tablets on day two, 4 tablets on day three, 3 tablets on day four, 2 tablets on day five, and 1 tablet on day six.  I have sent in [place] for you to use one capsule every 8 hours as needed for cough.  Please take an over-the-counter allergy medication to help keep your asthma at [place].  Follow up with this office or with primary care if symptoms are persisting.  Follow up in the ER for high fever, trouble swallowing, trouble breathing, other concerning symptoms.

## 2020-01-01 NOTE — ED Triage Notes (Signed)
Pt presents with wheezing, cough and shortness of breath on exertion x 2-3 days. Denies chest pain, nasal congestion, fever.   Pt request albuterol inhaler.

## 2020-02-01 ENCOUNTER — Ambulatory Visit (HOSPITAL_COMMUNITY)
Admission: EM | Admit: 2020-02-01 | Discharge: 2020-02-01 | Disposition: A | Discharge status: Home / Self Care | Payer: 59 | Attending: Student | Admitting: Student

## 2020-02-01 ENCOUNTER — Encounter (HOSPITAL_COMMUNITY): Payer: Self-pay | Admitting: Emergency Medicine

## 2020-02-01 DIAGNOSIS — J45909 Unspecified asthma, uncomplicated: Secondary | ICD-10-CM | POA: Insufficient documentation

## 2020-02-01 DIAGNOSIS — R051 Acute cough: Secondary | ICD-10-CM | POA: Diagnosis present

## 2020-02-01 DIAGNOSIS — Z20822 Contact with and (suspected) exposure to covid-19: Secondary | ICD-10-CM | POA: Diagnosis not present

## 2020-02-01 DIAGNOSIS — Z79899 Other long term (current) drug therapy: Secondary | ICD-10-CM | POA: Diagnosis not present

## 2020-02-01 DIAGNOSIS — D649 Anemia, unspecified: Secondary | ICD-10-CM | POA: Diagnosis not present

## 2020-02-01 DIAGNOSIS — J069 Acute upper respiratory infection, unspecified: Secondary | ICD-10-CM | POA: Diagnosis not present

## 2020-02-01 LAB — RESP PANEL BY RT-PCR (FLU A&B, COVID) ARPGX2
Influenza A by PCR: NEGATIVE
Influenza B by PCR: NEGATIVE
SARS Coronavirus 2 by RT PCR: NEGATIVE

## 2020-02-01 MED ORDER — ALBUTEROL SULFATE HFA 108 (90 BASE) MCG/ACT IN AERS
2.0000 | INHALATION_SPRAY | Freq: Once | RESPIRATORY_TRACT | Status: AC
  Administered 2020-02-01: 2 via RESPIRATORY_TRACT

## 2020-02-01 MED ORDER — BENZONATATE 100 MG PO CAPS
100.0000 mg | ORAL_CAPSULE | Freq: Three times a day (TID) | ORAL | 0 refills | Status: DC
Start: 2020-02-01 — End: 2022-07-02

## 2020-02-01 MED ORDER — ALBUTEROL SULFATE HFA 108 (90 BASE) MCG/ACT IN AERS
INHALATION_SPRAY | RESPIRATORY_TRACT | Status: AC
  Filled 2020-02-01: qty 6.7

## 2020-02-01 MED ORDER — PREDNISONE 10 MG (21) PO TBPK
ORAL_TABLET | Freq: Every day | ORAL | 0 refills | Status: DC
Start: 2020-02-01 — End: 2022-07-02

## 2020-02-01 NOTE — ED Provider Notes (Signed)
[facility]    CSN: [id number] Arrival date & time: 02/01/20  1730      History   Chief Complaint Chief Complaint  Patient presents with  . URI    HPI Julia Shaw is a 44 y.o. female Presenting for URI symptoms for 2 days. History of allergies, anemia, asthma. Presenting with wheezing, coughing, shortness of breath x1 week. History of multiple visits to this urgent care with similar complains in the last 6 months. Denies fevers/chills, n/v/d, chest pain, congestion, facial pain, teeth pain, headaches, sore throat, loss of taste/smell, swollen lymph nodes, ear pain. Denies chest pain, confusion, high fevers. Not vaccinated for covid-19.   HPI  Past Medical History:  Diagnosis Date  . Allergy   . Anemia   . Arthritis   . Asthma   . Breast lesion    Left  . Bronchitis   . Fibroid   . Hx of trichomoniasis   . Papilloma of breast    left    Patient Active Problem List   Diagnosis Date Noted  . Asthma 08/30/2015  . Allergy 08/30/2015  . Fibroid, uterus 03/03/2011    Past Surgical History:  Procedure Laterality Date  . BREAST LUMPECTOMY WITH RADIOACTIVE SEED LOCALIZATION Left 11/01/2016   Procedure: LEFT BREAST RADIOACTIVE SEED GUIDED LUMPECTOMY;  Surgeon: Griselda Miner, MD;  Location: [facility] OR;  Service: General;  Laterality: Left;  . BUNIONECTOMY     right foot  . DENTAL SURGERY    . ROBOT ASSISTED MYOMECTOMY  03/03/2011   Procedure: ROBOTIC ASSISTED MYOMECTOMY;  Surgeon: Esmeralda Arthur, MD;  Location: [facility];  Service: Gynecology;  Laterality: N/A;  . TUBAL LIGATION      OB History    Gravida  3   Para  2   Term  2   Preterm      AB  1   Living  2     SAB  1   IAB      Ectopic      Multiple      Live Births  2            Home Medications    Prior to Admission medications   Medication Sig Start Date End Date Taking? Authorizing Provider  benzonatate (TESSALON) 100 MG capsule Take 1 capsule (100 mg total) by mouth  every 8 (eight) hours. 02/01/20  Yes Rhys Martini, PA-C  predniSONE (STERAPRED UNI-PAK 21 TAB) 10 MG (21) TBPK tablet Take by mouth daily. Take 6 tabs by mouth daily  for 2 days, then 5 tabs for 2 days, then 4 tabs for 2 days, then 3 tabs for 2 days, 2 tabs for 2 days, then 1 tab by mouth daily for 2 days 02/01/20  Yes Cheree Ditto, Lyman Speller, PA-C  cetirizine (ZYRTEC ALLERGY) 10 MG tablet Take 1 tablet (10 mg total) by mouth daily. 12/11/19   Wallis Bamberg, PA-C  pramoxine (PROCTOFOAM) 1 % foam Place 1 application rectally 3 (three) times daily as needed for anal itching. 08/28/19   Mardella Layman, MD  promethazine-dextromethorphan (PROMETHAZINE-DM) 6.25-15 MG/5ML syrup Take 5 mLs by mouth at bedtime as needed for cough. 12/11/19   Wallis Bamberg, PA-C  albuterol (VENTOLIN HFA) 108 (90 Base) MCG/ACT inhaler Inhale 2 puffs into the lungs every 6 (six) hours as needed for wheezing or shortness of breath. 12/11/19 02/01/20  Wallis Bamberg, PA-C  DULERA 100-5 MCG/ACT AERO Inhale 2 puffs into the lungs every 6 (six) hours as needed  for wheezing.  08/14/15 06/27/19  [provider]  fluticasone (FLONASE) 50 MCG/ACT nasal spray Place 2 sprays into both nostrils daily. 05/04/19 06/27/19  Jacqlyn Larsen, PA-C    Family History Family History  Problem Relation Age of Onset  . Diabetes Mother   . Hypertension Mother   . Thyroid disease Father   . Heart disease Father   . Hyperlipidemia Father   . Cancer Maternal Grandmother     Social History Social History   Tobacco Use  . Smoking status: Never Smoker  . Smokeless tobacco: Never Used  Substance Use Topics  . Alcohol use: No  . Drug use: No     Allergies   Patient has no known allergies.   Review of Systems Review of Systems  Constitutional: Negative for appetite change, chills and fever.  HENT: Positive for congestion. Negative for ear pain, rhinorrhea, sinus pressure, sinus pain and sore throat.   Eyes: Negative for redness and visual disturbance.   Respiratory: Positive for cough and wheezing. Negative for chest tightness and shortness of breath.   Cardiovascular: Negative for chest pain and palpitations.  Gastrointestinal: Negative for abdominal pain, constipation, diarrhea, nausea and vomiting.  Genitourinary: Negative for dysuria, frequency and urgency.  Musculoskeletal: Negative for myalgias.  Neurological: Negative for dizziness, weakness and headaches.  Psychiatric/Behavioral: Negative for confusion.  All other systems reviewed and are negative.    Physical Exam Triage Vital Signs ED Triage Vitals  Enc Vitals Group     BP 02/01/20 1816 (!) 107/54     Pulse Rate 02/01/20 1816 86     Resp 02/01/20 1816 20     Temp 02/01/20 1816 98.3 F (36.8 C)     Temp Source 02/01/20 1816 Oral     SpO2 02/01/20 1816 100 %     Weight --      Height --      Head Circumference --      Peak Flow --      Pain Score 02/01/20 1813 0     Pain Loc --      Pain Edu? --      Excl. in [place]? --    No data found.  Updated Vital Signs BP (!) 107/54 (BP Location: Right Arm)   Pulse 86   Temp 98.3 F (36.8 C) (Oral)   Resp 20   LMP 01/21/2020   SpO2 100%   Visual Acuity Right Eye Distance:   Left Eye Distance:   Bilateral Distance:    Right Eye Near:   Left Eye Near:    Bilateral Near:     Physical Exam Vitals reviewed.  Constitutional:      General: She is not in acute distress.    Appearance: Normal appearance. She is not ill-appearing.  HENT:     Head: Normocephalic and atraumatic.     Right Ear: Hearing, tympanic membrane, ear canal and external ear normal. No swelling or tenderness. There is no impacted cerumen. No mastoid tenderness. Tympanic membrane is not perforated, erythematous, retracted or bulging.     Left Ear: Hearing, tympanic membrane, ear canal and external ear normal. No swelling or tenderness. There is no impacted cerumen. No mastoid tenderness. Tympanic membrane is not perforated, erythematous, retracted or  bulging.     Nose:     Right Sinus: No maxillary sinus tenderness or frontal sinus tenderness.     Left Sinus: No maxillary sinus tenderness or frontal sinus tenderness.     Mouth/Throat:  Mouth: Mucous membranes are moist.     Pharynx: Uvula midline. No oropharyngeal exudate or posterior oropharyngeal erythema.     Tonsils: No tonsillar exudate.  Eyes:     Comments: R strabismus at baseline  Cardiovascular:     Rate and Rhythm: Normal rate and regular rhythm.     Heart sounds: Normal heart sounds.  Pulmonary:     Breath sounds: Normal air entry. Wheezing present. No rhonchi or rales.     Comments: Scattered wheezes throughout  Chest:     Chest wall: No tenderness.  Abdominal:     General: Abdomen is flat. Bowel sounds are normal.     Tenderness: There is no abdominal tenderness. There is no guarding or rebound.  Lymphadenopathy:     Cervical: No cervical adenopathy.  Neurological:     General: No focal deficit present.     Mental Status: She is alert and oriented to person, place, and time.  Psychiatric:        Attention and Perception: Attention and perception normal.        Mood and Affect: Mood and affect normal.        Behavior: Behavior normal. Behavior is cooperative.        Thought Content: Thought content normal.        Judgment: Judgment normal.      UC Treatments / Results  Labs (all labs ordered are listed, but only abnormal results are displayed) Labs Reviewed  RESP PANEL BY RT-PCR (FLU A&B, COVID) [id number]    EKG   Radiology No results found.  Procedures Procedures (including critical care time)  Medications Ordered in UC Medications  albuterol (VENTOLIN HFA) 108 (90 Base) MCG/ACT inhaler 2 puff (has no administration in time range)    Initial Impression / Assessment and Plan / UC Course  I have reviewed the triage vital signs and the nursing notes.  Pertinent labs & imaging results that were available during my care of the patient were  reviewed by me and considered in my medical decision making (see chart for details).      afebrile nontachycardic nontachypneic.   Covid and influenza tests sent today. Patient is NOT vaccinated for covid-19. Isolation precautions per CDC guidelines until negative result. Symptomatic relief with OTC Mucinex, Nyquil, etc. Return precautions- new/worsening fevers/chills, shortness of breath, chest pain, abd pain, etc.   Prednisone, tessalon, albuterol as below. Pt is not diabetic and glucose 110 on 08/2019.  Final Clinical Impressions(s) / UC Diagnoses   Final diagnoses:  Viral upper respiratory tract infection     Discharge Instructions     -Start the albuterol inhaler today that we gave you -Start the Prednisone taper and tessalon tomorrow  Seek additional medical attention if you experience chest pain, shortness of breath, new/worsening fevers/chills, confusion, worsening of symptoms despite the above treatment plan, etc.      ED Prescriptions    Medication Sig Dispense Auth. Provider   predniSONE (STERAPRED UNI-PAK 21 TAB) 10 MG (21) TBPK tablet Take by mouth daily. Take 6 tabs by mouth daily  for 2 days, then 5 tabs for 2 days, then 4 tabs for 2 days, then 3 tabs for 2 days, 2 tabs for 2 days, then 1 tab by mouth daily for 2 days 42 tablet Rhys Martini, PA-C   benzonatate (TESSALON) 100 MG capsule Take 1 capsule (100 mg total) by mouth every 8 (eight) hours. 21 capsule Rhys Martini, PA-C     PDMP not reviewed  this encounter.   Hazel Sams, PA-C 02/01/20 1922

## 2020-02-01 NOTE — ED Triage Notes (Signed)
PT C/O: cold sx onset 1 week associated w/wheezing, coughing, SOB  Seen here on 01/01/20 for similar sx.   DENIES: f/v/n/d, chest pain  TAKING MEDS: none  A&O x4... NAD... Ambulatory

## 2020-02-01 NOTE — Discharge Instructions (Addendum)
-  Start the albuterol inhaler today that we gave you -Start the Prednisone taper and tessalon tomorrow  Seek additional medical attention if you experience chest pain, shortness of breath, new/worsening fevers/chills, confusion, worsening of symptoms despite the above treatment plan, etc.

## 2020-02-20 ENCOUNTER — Encounter (HOSPITAL_COMMUNITY): Payer: Self-pay | Admitting: Medical Oncology

## 2020-02-20 ENCOUNTER — Other Ambulatory Visit: Payer: Self-pay

## 2020-02-20 ENCOUNTER — Ambulatory Visit (HOSPITAL_COMMUNITY)
Admission: EM | Admit: 2020-02-20 | Discharge: 2020-02-20 | Disposition: A | Discharge status: Home / Self Care | Payer: Medicaid Other | Attending: Medical Oncology | Admitting: Medical Oncology

## 2020-02-20 DIAGNOSIS — M25552 Pain in left hip: Secondary | ICD-10-CM | POA: Diagnosis not present

## 2020-02-20 MED ORDER — ETODOLAC 400 MG PO TABS: 400.0000 mg | ORAL_TABLET | Freq: Two times a day (BID) | ORAL | 0 refills | Status: DC

## 2020-02-20 NOTE — ED Triage Notes (Signed)
Pt c/o left hip pain X 2 days. Pt states she did not have a fall. She states she took tylenol with no relief.

## 2020-02-20 NOTE — ED Provider Notes (Signed)
[place]    CSN: [id number] Arrival date & time: 02/20/20  0919      History   Chief Complaint Chief Complaint  Patient presents with  . Hip Pain    Left     HPI Julia Shaw is a 45 y.o. female.   HPI   She states that yesterday she started having left hip pain. No trauma or known injury. Pain rated 9/10 and does not radiate. She does note a history of osteoarthritis of the hip the hip but has not been set up with a hip specialist. Symptoms feel similar to past arthritis flares. She denies having any previous injections, procedures or surgeries on the hip but she reports being open to this in the future. For current symptoms she has taken tylenol which did not offer much relief. No history of GI bleed or history of NSAID intolerance. X rays of the hip from August 2020 show "minimal arthritic changes". No weakness, neuro changes or gait changes.   Past Medical History:  Diagnosis Date  . Allergy   . Anemia   . Arthritis   . Asthma   . Breast lesion    Left  . Bronchitis   . Fibroid   . Hx of trichomoniasis   . Papilloma of breast    left    Patient Active Problem List   Diagnosis Date Noted  . Asthma 08/30/2015  . Allergy 08/30/2015  . Fibroid, uterus 03/03/2011    Past Surgical History:  Procedure Laterality Date  . BREAST LUMPECTOMY WITH RADIOACTIVE SEED LOCALIZATION Left 11/01/2016   Procedure: LEFT BREAST RADIOACTIVE SEED GUIDED LUMPECTOMY;  Surgeon: Jovita Kussmaul, MD;  Location: [place];  Service: General;  Laterality: Left;  . BUNIONECTOMY     right foot  . DENTAL SURGERY    . ROBOT ASSISTED MYOMECTOMY  03/03/2011   Procedure: ROBOTIC ASSISTED MYOMECTOMY;  Surgeon: Alwyn Pea, MD;  Location: [facility];  Service: Gynecology;  Laterality: N/A;  . TUBAL LIGATION      OB History    Gravida  3   Para  2   Term  2   Preterm      AB  1   Living  2     SAB  1   IAB      Ectopic      Multiple      Live Births  2             Home Medications    Prior to Admission medications   Medication Sig Start Date End Date Taking? Authorizing Provider  etodolac (LODINE) 400 MG tablet Take 1 tablet (400 mg total) by mouth 2 (two) times daily. Take with food 02/20/20  Yes Nelwyn Salisbury M, PA-C  benzonatate (TESSALON) 100 MG capsule Take 1 capsule (100 mg total) by mouth every 8 (eight) hours. 02/01/20   Hazel Sams, PA-C  cetirizine (ZYRTEC ALLERGY) 10 MG tablet Take 1 tablet (10 mg total) by mouth daily. 12/11/19   Jaynee Eagles, PA-C  pramoxine (PROCTOFOAM) 1 % foam Place 1 application rectally 3 (three) times daily as needed for anal itching. 08/28/19   Bayleigh Kick, MD  predniSONE (STERAPRED UNI-PAK 21 TAB) 10 MG (21) TBPK tablet Take by mouth daily. Take 6 tabs by mouth daily  for 2 days, then 5 tabs for 2 days, then 4 tabs for 2 days, then 3 tabs for 2 days, 2 tabs for 2 days, then 1 tab by mouth  daily for 2 days 02/01/20   Hazel Sams, PA-C  promethazine-dextromethorphan (PROMETHAZINE-DM) 6.25-15 MG/5ML syrup Take 5 mLs by mouth at bedtime as needed for cough. 12/11/19   Jaynee Eagles, PA-C  albuterol (VENTOLIN HFA) 108 (90 Base) MCG/ACT inhaler Inhale 2 puffs into the lungs every 6 (six) hours as needed for wheezing or shortness of breath. 12/11/19 02/01/20  Jaynee Eagles, PA-C  DULERA 100-5 MCG/ACT AERO Inhale 2 puffs into the lungs every 6 (six) hours as needed for wheezing.  08/14/15 06/27/19  [provider]  fluticasone (FLONASE) 50 MCG/ACT nasal spray Place 2 sprays into both nostrils daily. 05/04/19 06/27/19  Jacqlyn Larsen, PA-C    Family History Family History  Problem Relation Age of Onset  . Diabetes Mother   . Hypertension Mother   . Thyroid disease Father   . Heart disease Father   . Hyperlipidemia Father   . Cancer Maternal Grandmother     Social History Social History   Tobacco Use  . Smoking status: Never Smoker  . Smokeless tobacco: Never Used  Substance Use Topics  . Alcohol  use: No  . Drug use: No     Allergies   Patient has no known allergies.   Review of Systems Review of Systems  Musculoskeletal: Negative for gait problem.  Neurological: Negative for weakness.     Physical Exam Triage Vital Signs ED Triage Vitals  Enc Vitals Group     BP 02/20/20 1037 115/80     Pulse Rate 02/20/20 1037 84     Resp 02/20/20 1037 18     Temp 02/20/20 1037 98.1 F (36.7 C)     Temp Source 02/20/20 1037 Oral     SpO2 02/20/20 1037 97 %     Weight --      Height --      Head Circumference --      Peak Flow --      Pain Score 02/20/20 1039 9     Pain Loc --      Pain Edu? --      Excl. in [place]? --    No data found.  Updated Vital Signs BP 115/80 (BP Location: Left Arm)   Pulse 84   Temp 98.1 F (36.7 C) (Oral)   Resp 18   LMP 01/17/2020 (Approximate)   SpO2 97%   Visual Acuity Right Eye Distance:   Left Eye Distance:   Bilateral Distance:    Right Eye Near:   Left Eye Near:    Bilateral Near:     Physical Exam Constitutional:      Appearance: Normal appearance.  Musculoskeletal:     Lumbar back: Normal. No spasms or tenderness.     Right hip: Normal.     Left hip: Tenderness present. No bony tenderness or crepitus. Normal range of motion. Normal strength.     Right knee: Crepitus present. No bony tenderness. Normal range of motion. No tenderness.     Left knee: Crepitus present. No bony tenderness. Normal range of motion. No tenderness.  Neurological:     Mental Status: She is alert.     Motor: No weakness.     Coordination: Coordination normal.     Gait: Gait normal.     Deep Tendon Reflexes: Reflexes normal.  Psychiatric:        Mood and Affect: Mood normal.    UC Treatments / Results  Labs (all labs ordered are listed, but only abnormal results are displayed) Labs Reviewed -  No data to display  EKG   Radiology No results found.  Procedures Procedures (including critical care time)  Medications Ordered in  UC Medications - No data to display  Initial Impression / Assessment and Plan / UC Course  I have reviewed the triage vital signs and the nursing notes.  Pertinent labs & imaging results that were available during my care of the patient were reviewed by me and considered in my medical decision making (see chart for details).     Acute on chronic. No red flag symptoms. Will treat with Lodine. We discussed side effects, how to take this medication and precautions, and red flag symptoms. No imaging today as symptoms have not changed from normal flares and no new injury. Reviewed former x ray. Discussed my recommendation for her to be seen by her PCP for follow up or consider a referral to orthopedics/PT.      Final Clinical Impressions(s) / UC Diagnoses   Final diagnoses:  Left hip pain   Discharge Instructions   None    ED Prescriptions    Medication Sig Dispense Auth. Provider   etodolac (LODINE) 400 MG tablet Take 1 tablet (400 mg total) by mouth 2 (two) times daily. Take with food 30 tablet Hughie Closs, [place]     PDMP not reviewed this encounter.   Hughie Closs, [place] 02/20/20 1113

## 2020-02-21 ENCOUNTER — Other Ambulatory Visit: Payer: Self-pay | Admitting: Family Medicine

## 2020-02-21 ENCOUNTER — Ambulatory Visit
Admission: RE | Admit: 2020-02-21 | Discharge: 2020-02-21 | Disposition: A | Discharge status: Home / Self Care | Payer: Medicaid Other | Source: Ambulatory Visit | Attending: Family Medicine | Admitting: Family Medicine

## 2020-02-21 DIAGNOSIS — M25552 Pain in left hip: Secondary | ICD-10-CM

## 2021-11-13 ENCOUNTER — Ambulatory Visit (HOSPITAL_COMMUNITY)
Admission: EM | Admit: 2021-11-13 | Discharge: 2021-11-13 | Disposition: A | Discharge status: Home / Self Care | Payer: Medicaid Other | Attending: Internal Medicine | Admitting: Internal Medicine

## 2021-11-13 ENCOUNTER — Encounter (HOSPITAL_COMMUNITY): Payer: Self-pay

## 2021-11-13 DIAGNOSIS — K644 Residual hemorrhoidal skin tags: Secondary | ICD-10-CM | POA: Diagnosis present

## 2021-11-13 DIAGNOSIS — B3731 Acute candidiasis of vulva and vagina: Secondary | ICD-10-CM | POA: Diagnosis present

## 2021-11-13 DIAGNOSIS — N898 Other specified noninflammatory disorders of vagina: Secondary | ICD-10-CM | POA: Diagnosis present

## 2021-11-13 MED ORDER — HYDROCORTISONE 1 % EX CREA: TOPICAL_CREAM | CUTANEOUS | 0 refills | Status: DC

## 2021-11-13 MED ORDER — DOCUSATE SODIUM 100 MG PO CAPS: 100.0000 mg | ORAL_CAPSULE | Freq: Every day | ORAL | 0 refills | Status: DC

## 2021-11-13 MED ORDER — FLUCONAZOLE 150 MG PO TABS: 150.0000 mg | ORAL_TABLET | ORAL | 0 refills | Status: DC

## 2021-11-13 NOTE — ED Provider Notes (Signed)
[facility]    CSN: [id number] Arrival date & time: 11/13/21  0856      History   Chief Complaint Chief Complaint  Patient presents with   Vaginal Discharge   Anal Itching    And bump    HPI Julia Shaw is a 46 y.o. female.   Patient presents urgent care for evaluation of vaginal discharge, vaginal rash, vaginal itching, and "bump" to the perianal area that she noticed 3 days ago.  Denies vaginal odor, urinary symptoms, fever/chills, abdominal pain, nausea, vomiting, dizziness, headaches, pain with defecation, blood/mucus to the stools, diarrhea, and constipation.  She has never had this happen in the past.  No recent new sexual partners or concern for STD at this time.  Last menstrual cycle was October 25, 2021.  Rash to the vaginal area is nondraining but is red and very itchy per patient.  No recent antibiotic use.  She denies diagnosis of type 2 diabetes.  She has attempted use of sits baths and "bird baths" with Epsom salt to help with the vaginal discharge/rectal bump without much relief.  Last normal bowel movement was within the last 2 days. No attempted use of any over-the-counter medications prior to arrival urgent care.   Vaginal Discharge   Past Medical History:  Diagnosis Date   Allergy    Anemia    Arthritis    Asthma    Breast lesion    Left   Bronchitis    Fibroid    Hx of trichomoniasis    Papilloma of breast    left    Patient Active Problem List   Diagnosis Date Noted   Asthma 08/30/2015   Allergy 08/30/2015   Fibroid, uterus 03/03/2011    Past Surgical History:  Procedure Laterality Date   BREAST LUMPECTOMY WITH RADIOACTIVE SEED LOCALIZATION Left 11/01/2016   Procedure: LEFT BREAST RADIOACTIVE SEED GUIDED LUMPECTOMY;  Surgeon: Jovita Kussmaul, MD;  Location: [facility];  Service: General;  Laterality: Left;   BUNIONECTOMY     right foot   DENTAL SURGERY     ROBOT ASSISTED MYOMECTOMY  03/03/2011   Procedure: ROBOTIC ASSISTED  MYOMECTOMY;  Surgeon: Alwyn Pea, MD;  Location: [facility];  Service: Gynecology;  Laterality: N/A;   TUBAL LIGATION      OB History     Gravida  3   Para  2   Term  2   Preterm      AB  1   Living  2      SAB  1   IAB      Ectopic      Multiple      Live Births  2            Home Medications    Prior to Admission medications   Medication Sig Start Date End Date Taking? Authorizing Provider  budesonide-formoterol (SYMBICORT) 160-4.5 MCG/ACT inhaler 2 puffs Inhalation Twice a day for 30 days   Yes [provider]  docusate sodium (COLACE) 100 MG capsule Take 1 capsule (100 mg total) by mouth daily. 11/13/21  Yes Talbot Grumbling, FNP  fluconazole (DIFLUCAN) 150 MG tablet Take 1 tablet (150 mg total) by mouth every 3 (three) days. 11/13/21  Yes Talbot Grumbling, FNP  hydrocortisone cream 1 % Apply to affected area 2 times daily 11/13/21  Yes Jaleigh Mccroskey, Stasia Cavalier, FNP  montelukast (SINGULAIR) 10 MG tablet Take by mouth. 09/10/21  Yes [provider]  benzonatate (TESSALON)  100 MG capsule Take 1 capsule (100 mg total) by mouth every 8 (eight) hours. 02/01/20   Hazel Sams, PA-C  cetirizine (ZYRTEC ALLERGY) 10 MG tablet Take 1 tablet (10 mg total) by mouth daily. 12/11/19   Jaynee Eagles, PA-C  etodolac (LODINE) 400 MG tablet Take 1 tablet (400 mg total) by mouth 2 (two) times daily. Take with food 02/20/20   Hughie Closs, PA-C  pramoxine (PROCTOFOAM) 1 % foam Place 1 application rectally 3 (three) times daily as needed for anal itching. 08/28/19   Pasha Kick, MD  predniSONE (STERAPRED UNI-PAK 21 TAB) 10 MG (21) TBPK tablet Take by mouth daily. Take 6 tabs by mouth daily  for 2 days, then 5 tabs for 2 days, then 4 tabs for 2 days, then 3 tabs for 2 days, 2 tabs for 2 days, then 1 tab by mouth daily for 2 days 02/01/20   Hazel Sams, PA-C  promethazine-dextromethorphan (PROMETHAZINE-DM) 6.25-15 MG/5ML syrup Take 5 mLs by mouth at  bedtime as needed for cough. 12/11/19   Jaynee Eagles, PA-C  albuterol (VENTOLIN HFA) 108 (90 Base) MCG/ACT inhaler Inhale 2 puffs into the lungs every 6 (six) hours as needed for wheezing or shortness of breath. 12/11/19 02/01/20  Jaynee Eagles, PA-C  DULERA 100-5 MCG/ACT AERO Inhale 2 puffs into the lungs every 6 (six) hours as needed for wheezing.  08/14/15 06/27/19  [provider]  fluticasone (FLONASE) 50 MCG/ACT nasal spray Place 2 sprays into both nostrils daily. 05/04/19 06/27/19  Jacqlyn Larsen, PA-C    Family History Family History  Problem Relation Age of Onset   Diabetes Mother    Hypertension Mother    Thyroid disease Father    Heart disease Father    Hyperlipidemia Father    Cancer Maternal Grandmother     Social History Social History   Tobacco Use   Smoking status: Never   Smokeless tobacco: Never  Substance Use Topics   Alcohol use: No   Drug use: No     Allergies   Patient has no known allergies.   Review of Systems Review of Systems  Genitourinary:  Positive for vaginal discharge.  Per HPI   Physical Exam Triage Vital Signs ED Triage Vitals  Enc Vitals Group     BP 11/13/21 0923 122/78     Pulse Rate 11/13/21 0923 75     Resp 11/13/21 0923 16     Temp 11/13/21 0923 98.8 F (37.1 C)     Temp Source 11/13/21 0923 Oral     SpO2 11/13/21 0923 98 %     Weight --      Height --      Head Circumference --      Peak Flow --      Pain Score 11/13/21 0926 0     Pain Loc --      Pain Edu? --      Excl. in [place]? --    No data found.  Updated Vital Signs BP 122/78 (BP Location: Right Arm)   Pulse 75   Temp 98.8 F (37.1 C) (Oral)   Resp 16   LMP 10/25/2021 (Approximate)   SpO2 98%   Visual Acuity Right Eye Distance:   Left Eye Distance:   Bilateral Distance:    Right Eye Near:   Left Eye Near:    Bilateral Near:     Physical Exam Vitals and nursing note reviewed. Exam conducted with a chaperone present [facility], CMA).  Constitutional:       Appearance: She is not ill-appearing or toxic-appearing.  HENT:     Head: Normocephalic and atraumatic.     Right Ear: Hearing and external ear normal.     Left Ear: Hearing and external ear normal.     Nose: Nose normal.     Mouth/Throat:     Lips: Pink.  Eyes:     General: Lids are normal. Vision grossly intact. Gaze aligned appropriately.     Extraocular Movements: Extraocular movements intact.     Conjunctiva/sclera: Conjunctivae normal.  Cardiovascular:     Rate and Rhythm: Normal rate and regular rhythm.     Heart sounds: Normal heart sounds, S1 normal and S2 normal.  Pulmonary:     Effort: Pulmonary effort is normal. No respiratory distress.     Breath sounds: Normal breath sounds and air entry.  Genitourinary:    General: Normal vulva.     Exam position: Knee-chest position.     Labia:        Right: No rash.        Left: No rash.      Comments: Nonthrombosed and nonbleeding external hemorrhoid present to exam of rectum.  No abscess, rash, or anal fissures. Copious white thick vaginal discharge present to the vaginal os.  No evidence of rash, herpetic lesions, or injury to the vaginal canal/labia majora or minora. Musculoskeletal:     Cervical back: Neck supple.  Skin:    General: Skin is warm and dry.     Capillary Refill: Capillary refill takes less than 2 seconds.     Findings: No rash.  Neurological:     General: No focal deficit present.     Mental Status: She is alert and oriented to person, place, and time. Mental status is at baseline.     Cranial Nerves: No dysarthria or facial asymmetry.  Psychiatric:        Mood and Affect: Mood normal.        Speech: Speech normal.        Behavior: Behavior normal.        Thought Content: Thought content normal.        Judgment: Judgment normal.      UC Treatments / Results  Labs (all labs ordered are listed, but only abnormal results are displayed) Labs Reviewed - No data to display  EKG   Radiology No  results found.  Procedures Procedures (including critical care time)  Medications Ordered in UC Medications - No data to display  Initial Impression / Assessment and Plan / UC Course  I have reviewed the triage vital signs and the nursing notes.  Pertinent labs & imaging results that were available during my care of the patient were reviewed by me and considered in my medical decision making (see chart for details).   1.  Yeast vaginitis We will go ahead and empirically treat for vaginal yeast infection based on physical exam findings in clinic today.  Diflucan sent to pharmacy and patient to take this once today then again in 3 days.  STD testing is pending and will come back in the next 2 to 3 days.  Advised patient to avoid sexual intercourse until testing comes back to avoid spread of STI to partners.  Condom use advised. Advised to avoid wearing tight clothing and discussed use of cotton underwear to prevent future similar vaginitis infections. No appreciable external vaginal fungal rash and therefore deferred treatment with topical antifungal cream.  2. External hemorrhoid Patient may begin using Preparation H hemorrhoid cream twice daily for the next 5 to 7 days until hemorrhoid symptoms improved.  Advised to avoid sitting on the toilet for more than 5 minutes as this can worsen hemorrhoids.  Increase fiber and water intake advised.  She may use Colace stool softener once daily to soften stools and prevent future constipation/hemorrhoids.   Discussed physical exam and available lab work findings in clinic with patient.  Counseled patient regarding appropriate use of medications and potential side effects for all medications recommended or prescribed today. Discussed red flag signs and symptoms of worsening condition,when to call the PCP office, return to urgent care, and when to seek higher level of care in the emergency department. Patient verbalizes understanding and agreement with plan.  All questions answered. Patient discharged in stable condition.    Final Clinical Impressions(s) / UC Diagnoses   Final diagnoses:  Yeast vaginitis  Vaginal itching  External hemorrhoid     Discharge Instructions      Apply Preparation H ointment to your rectal hemorrhoid 2 times daily until you experience symptom relief (5 to 7 days).  Increase your water intake and increase your fiber intake by eating whole grains, fruits, vegetables or fiber supplement to help soften your bowel movements to prevent hemorrhoids in the future.  You may also take Colace stool softener to prevent constipation in the future which causes hemorrhoids.  Avoid straining when using the bathroom and do not sit on the toilet for more than 5 minutes at a time.  You likely have a vaginal yeast infection.  I would like for you to start taking Diflucan 1 tablet today, then another tablet in 3 days. Your STD testing is pending and will come back in the next 2 to 3 days.  We will call you if any of these results are positive requiring further treatment other than Diflucan.    ED Prescriptions     Medication Sig Dispense Auth. Provider   fluconazole (DIFLUCAN) 150 MG tablet Take 1 tablet (150 mg total) by mouth every 3 (three) days. 2 tablet Talbot Grumbling, FNP   hydrocortisone cream 1 % Apply to affected area 2 times daily 15 g Joella Prince M, FNP   docusate sodium (COLACE) 100 MG capsule Take 1 capsule (100 mg total) by mouth daily. 60 capsule Talbot Grumbling, FNP      PDMP not reviewed this encounter.   Talbot Grumbling, [place] 11/13/21 1054

## 2021-11-13 NOTE — Discharge Instructions (Addendum)
Apply Preparation H ointment to your rectal hemorrhoid 2 times daily until you experience symptom relief (5 to 7 days).  Increase your water intake and increase your fiber intake by eating whole grains, fruits, vegetables or fiber supplement to help soften your bowel movements to prevent hemorrhoids in the future.  You may also take Colace stool softener to prevent constipation in the future which causes hemorrhoids.  Avoid straining when using the bathroom and do not sit on the toilet for more than 5 minutes at a time.  You likely have a vaginal yeast infection.  I would like for you to start taking Diflucan 1 tablet today, then another tablet in 3 days. Your STD testing is pending and will come back in the next 2 to 3 days.  We will call you if any of these results are positive requiring further treatment other than Diflucan.

## 2021-11-13 NOTE — ED Triage Notes (Signed)
Patient having rash and vaginal discharge ( white) and itching. Patient has a bump on the anus as well. Onset 3 days ago.

## 2021-11-16 ENCOUNTER — Telehealth (HOSPITAL_COMMUNITY): Payer: Self-pay

## 2021-11-16 LAB — CERVICOVAGINAL ANCILLARY ONLY
Bacterial Vaginitis (gardnerella): POSITIVE — AB
Candida Glabrata: NEGATIVE
Candida Vaginitis: NEGATIVE
Chlamydia: NEGATIVE
Comment: NEGATIVE
Comment: NEGATIVE
Comment: NEGATIVE
Comment: NEGATIVE
Comment: NEGATIVE
Comment: NORMAL
Neisseria Gonorrhea: NEGATIVE
Trichomonas: NEGATIVE

## 2021-11-16 MED ORDER — METRONIDAZOLE 500 MG PO TABS: 500.0000 mg | ORAL_TABLET | Freq: Two times a day (BID) | ORAL | 0 refills | Status: DC

## 2021-12-03 ENCOUNTER — Encounter (HOSPITAL_COMMUNITY): Payer: Self-pay | Admitting: Emergency Medicine

## 2021-12-03 ENCOUNTER — Emergency Department (HOSPITAL_COMMUNITY)
Admission: EM | Admit: 2021-12-03 | Discharge: 2021-12-04 | Disposition: A | Discharge status: Home / Self Care | Payer: Commercial Managed Care - HMO | Attending: Emergency Medicine | Admitting: Emergency Medicine

## 2021-12-03 ENCOUNTER — Other Ambulatory Visit: Payer: Self-pay

## 2021-12-03 ENCOUNTER — Emergency Department (HOSPITAL_COMMUNITY): Payer: Commercial Managed Care - HMO

## 2021-12-03 DIAGNOSIS — Y92410 Unspecified street and highway as the place of occurrence of the external cause: Secondary | ICD-10-CM | POA: Insufficient documentation

## 2021-12-03 DIAGNOSIS — M546 Pain in thoracic spine: Secondary | ICD-10-CM | POA: Insufficient documentation

## 2021-12-03 DIAGNOSIS — Z7951 Long term (current) use of inhaled steroids: Secondary | ICD-10-CM | POA: Diagnosis not present

## 2021-12-03 DIAGNOSIS — M25562 Pain in left knee: Secondary | ICD-10-CM | POA: Diagnosis not present

## 2021-12-03 DIAGNOSIS — M545 Low back pain, unspecified: Secondary | ICD-10-CM | POA: Diagnosis not present

## 2021-12-03 DIAGNOSIS — M542 Cervicalgia: Secondary | ICD-10-CM | POA: Insufficient documentation

## 2021-12-03 DIAGNOSIS — J45909 Unspecified asthma, uncomplicated: Secondary | ICD-10-CM | POA: Insufficient documentation

## 2021-12-03 LAB — I-STAT BETA HCG BLOOD, ED (MC, WL, AP ONLY): I-stat hCG, quantitative: <5 m[IU]/mL (ref <5)
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
## 2021-12-04 MED ORDER — METHOCARBAMOL 500 MG PO TABS: 500.0000 mg | ORAL_TABLET | Freq: Two times a day (BID) | ORAL | 0 refills | Status: DC

## 2021-12-04 MED ORDER — IBUPROFEN 600 MG PO TABS: 600.0000 mg | ORAL_TABLET | Freq: Four times a day (QID) | ORAL | 0 refills | Status: DC | PRN
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
## 2022-03-10 ENCOUNTER — Encounter (HOSPITAL_COMMUNITY): Payer: Self-pay

## 2022-03-10 ENCOUNTER — Ambulatory Visit (HOSPITAL_COMMUNITY)
Admission: EM | Admit: 2022-03-10 | Discharge: 2022-03-10 | Disposition: A | Discharge status: Home / Self Care | Payer: 59 | Attending: Emergency Medicine | Admitting: Emergency Medicine

## 2022-03-10 DIAGNOSIS — N898 Other specified noninflammatory disorders of vagina: Secondary | ICD-10-CM | POA: Insufficient documentation

## 2022-03-10 MED ORDER — METRONIDAZOLE 500 MG PO TABS
500.0000 mg | ORAL_TABLET | Freq: Two times a day (BID) | ORAL | 0 refills | Status: DC
Start: 2022-03-10 — End: 2022-10-17

## 2022-03-10 MED ORDER — MICONAZOLE NITRATE 2 % EX CREA: 1.0000 | TOPICAL_CREAM | Freq: Two times a day (BID) | CUTANEOUS | 0 refills | Status: DC

## 2022-03-10 NOTE — ED Provider Notes (Signed)
[place]    CSN: [id number] Arrival date & time: 03/10/22  0263      History   Chief Complaint Chief Complaint  Patient presents with   Vaginal Discharge    HPI Julia Shaw is a 47 y.o. female.   Patient presents for evaluation of Remus Hagedorn thin vaginal discharge with odor and vaginal irritation present for 3 days.  Symptoms began after changing soaps.  Has not attempted treatment.  Denies any sexual activity.  Denies urinary symptoms, new rash or lesions, lower abdominal pain or pressure or flank pain.   Past Medical History:  Diagnosis Date   Allergy    Anemia    Arthritis    Asthma    Breast lesion    Left   Bronchitis    Fibroid    Hx of trichomoniasis    Papilloma of breast    left    Patient Active Problem List   Diagnosis Date Noted   Asthma 08/30/2015   Allergy 08/30/2015   Fibroid, uterus 03/03/2011    Past Surgical History:  Procedure Laterality Date   BREAST LUMPECTOMY WITH RADIOACTIVE SEED LOCALIZATION Left 11/01/2016   Procedure: LEFT BREAST RADIOACTIVE SEED GUIDED LUMPECTOMY;  Surgeon: Jovita Kussmaul, MD;  Location: [facility];  Service: General;  Laterality: Left;   BUNIONECTOMY     right foot   DENTAL SURGERY     ROBOT ASSISTED MYOMECTOMY  03/03/2011   Procedure: ROBOTIC ASSISTED MYOMECTOMY;  Surgeon: Alwyn Pea, MD;  Location: [facility];  Service: Gynecology;  Laterality: N/A;   TUBAL LIGATION      OB History     Gravida  3   Para  2   Term  2   Preterm      AB  1   Living  2      SAB  1   IAB      Ectopic      Multiple      Live Births  2            Home Medications    Prior to Admission medications   Medication Sig Start Date End Date Taking? Authorizing Provider  benzonatate (TESSALON) 100 MG capsule Take 1 capsule (100 mg total) by mouth every 8 (eight) hours. 02/01/20   Hazel Sams, PA-C  budesonide-formoterol (SYMBICORT) 160-4.5 MCG/ACT inhaler 2 puffs Inhalation Twice a day for 30 days     [provider]  cetirizine (ZYRTEC ALLERGY) 10 MG tablet Take 1 tablet (10 mg total) by mouth daily. 12/11/19   Jaynee Eagles, PA-C  docusate sodium (COLACE) 100 MG capsule Take 1 capsule (100 mg total) by mouth daily. 11/13/21   Talbot Grumbling, FNP  etodolac (LODINE) 400 MG tablet Take 1 tablet (400 mg total) by mouth 2 (two) times daily. Take with food 02/20/20   Hughie Closs, PA-C  fluconazole (DIFLUCAN) 150 MG tablet Take 1 tablet (150 mg total) by mouth every 3 (three) days. 11/13/21   Talbot Grumbling, FNP  hydrocortisone cream 1 % Apply to affected area 2 times daily 11/13/21   Talbot Grumbling, FNP  ibuprofen (ADVIL) 600 MG tablet Take 1 tablet (600 mg total) by mouth every 6 (six) hours as needed. 12/04/21   Sherrell Puller, PA-C  methocarbamol (ROBAXIN) 500 MG tablet Take 1 tablet (500 mg total) by mouth 2 (two) times daily. 12/04/21   Sherrell Puller, PA-C  metroNIDAZOLE (FLAGYL) 500 MG tablet Take 1 tablet (500 mg  total) by mouth 2 (two) times daily. 11/16/21   Lamptey, Myrene Galas, MD  montelukast (SINGULAIR) 10 MG tablet Take by mouth. 09/10/21   [provider]  pramoxine (PROCTOFOAM) 1 % foam Place 1 application rectally 3 (three) times daily as needed for anal itching. 08/28/19   Jacquetta Kick, MD  predniSONE (STERAPRED UNI-PAK 21 TAB) 10 MG (21) TBPK tablet Take by mouth daily. Take 6 tabs by mouth daily  for 2 days, then 5 tabs for 2 days, then 4 tabs for 2 days, then 3 tabs for 2 days, 2 tabs for 2 days, then 1 tab by mouth daily for 2 days 02/01/20   Hazel Sams, PA-C  promethazine-dextromethorphan (PROMETHAZINE-DM) 6.25-15 MG/5ML syrup Take 5 mLs by mouth at bedtime as needed for cough. 12/11/19   Jaynee Eagles, PA-C  albuterol (VENTOLIN HFA) 108 (90 Base) MCG/ACT inhaler Inhale 2 puffs into the lungs every 6 (six) hours as needed for wheezing or shortness of breath. 12/11/19 02/01/20  Jaynee Eagles, PA-C  DULERA 100-5 MCG/ACT AERO Inhale 2 puffs into  the lungs every 6 (six) hours as needed for wheezing.  08/14/15 06/27/19  [provider]  fluticasone (FLONASE) 50 MCG/ACT nasal spray Place 2 sprays into both nostrils daily. 05/04/19 06/27/19  Jacqlyn Larsen, PA-C    Family History Family History  Problem Relation Age of Onset   Diabetes Mother    Hypertension Mother    Thyroid disease Father    Heart disease Father    Hyperlipidemia Father    Cancer Maternal Grandmother     Social History Social History   Tobacco Use   Smoking status: Never   Smokeless tobacco: Never  Substance Use Topics   Alcohol use: No   Drug use: No     Allergies   Patient has no known allergies.   Review of Systems Review of Systems  Constitutional: Negative.   Respiratory: Negative.    Cardiovascular: Negative.   Genitourinary:  Positive for vaginal discharge. Negative for decreased urine volume, difficulty urinating, dyspareunia, dysuria, enuresis, flank pain, frequency, genital sores, hematuria, menstrual problem, pelvic pain, urgency, vaginal bleeding and vaginal pain.  Skin: Negative.      Physical Exam Triage Vital Signs ED Triage Vitals  Enc Vitals Group     BP 03/10/22 0927 136/84     Pulse Rate 03/10/22 0927 90     Resp 03/10/22 0927 18     Temp 03/10/22 0927 98.5 F (36.9 C)     Temp src --      SpO2 03/10/22 0927 98 %     Weight --      Height --      Head Circumference --      Peak Flow --      Pain Score 03/10/22 0926 0     Pain Loc --      Pain Edu? --      Excl. in [place]? --    No data found.  Updated Vital Signs BP 136/84   Pulse 90   Temp 98.5 F (36.9 C)   Resp 18   LMP 02/27/2022   SpO2 98%   Visual Acuity Right Eye Distance:   Left Eye Distance:   Bilateral Distance:    Right Eye Near:   Left Eye Near:    Bilateral Near:     Physical Exam Constitutional:      Appearance: Normal appearance.  HENT:     Head: Normocephalic.  Eyes:  Extraocular Movements: Extraocular movements intact.   Pulmonary:     Effort: Pulmonary effort is normal.  Genitourinary:    Comments: deferred Neurological:     Mental Status: She is alert and oriented to person, place, and time. Mental status is at baseline.      UC Treatments / Results  Labs (all labs ordered are listed, but only abnormal results are displayed) Labs Reviewed  CERVICOVAGINAL ANCILLARY ONLY    EKG   Radiology No results found.  Procedures Procedures (including critical care time)  Medications Ordered in UC Medications - No data to display  Initial Impression / Assessment and Plan / UC Course  I have reviewed the triage vital signs and the nursing notes.  Pertinent labs & imaging results that were available during my care of the patient were reviewed by me and considered in my medical decision making (see chart for details).  Vaginal discharge  Treating prophylactically for bacterial vaginosis, metronidazole prescribed, advised abstaining from alcohol during usage, external miconazole cream prescribed additionally for general comfort, discussed administration, obtaining swab checking for yeast and bacterial vaginosis pending, will defer further STD testing as she denies any sexual activity, given recommendations for supportive care, may follow-up with his urgent care as needed Final Clinical Impressions(s) / UC Diagnoses   Final diagnoses:  None   Discharge Instructions   None    ED Prescriptions   None    PDMP not reviewed this encounter.   Hans Eden, [place] 03/10/22 [phone]

## 2022-03-10 NOTE — ED Triage Notes (Signed)
Pt presents to uc with co of vaginal irritation and discharge for 3 days concerned for bv

## 2022-03-10 NOTE — Discharge Instructions (Signed)
Today you are being treated prophylactically for  Bacterial vaginosis   Take Metronidazole 500 mg twice a day for 7 days, do not drink alcohol while using medication, this will make you feel sick   You may use miconazole cream on the outside only, may use twice daily as needed for comfort  Bacterial vaginosis which results from an overgrowth of one on several organisms that are normally present in your vagina. Vaginosis is an inflammation of the vagina that can result in discharge, itching and pain.  Labs pending 2-3 days, you will be contacted if positive for any sti and treatment will be sent to the pharmacy, you will have to return to the clinic if positive for gonorrhea to receive treatment   In addition: Avoid baths, hot tubs and whirlpool spas.  Don't use scented or harsh soaps Avoid irritants. These include scented tampons and pads. Wipe from front to back after using the toilet. Don't douche. Your vagina doesn't require cleansing other than normal bathing.  Use a condom.  Wear cotton underwear, this fabric absorbs some moisture.

## 2022-03-11 LAB — CERVICOVAGINAL ANCILLARY ONLY
Bacterial Vaginitis (gardnerella): POSITIVE — AB
Candida Glabrata: NEGATIVE
Candida Vaginitis: NEGATIVE
Comment: NEGATIVE
Comment: NEGATIVE
Comment: NEGATIVE

## 2022-06-30 NOTE — H&P (Signed)
  Patient: Julia Shaw  PID: [id number]  DOB: 1975-10-25  SEX: Female   Patient referred by DDS for extraction all upper teeth to prepare for denture  CC: Bad teeth  Past Medical History:  Asthma, Sinus Issues, Arthritis, Obese    Medications: Allergy medicine, inhaler    Allergies:     None    Surgeries:   Foot surgery, Tubes Tied, Oral Surgery     Social History       Smoking:  n           Alcohol:n Drug use:n                             Exam: BMI 38. Caries teeth # 3, 5, 6, 8, 9, 10, 11, 12. Palatal torus. Hyperplastic left osseous tuberosity impinging on lower ridge.  No purulence, edema, fluctuance, trismus. Oral cancer screening negative. Pharynx clear. No lymphadenopathy.  Panorex:Caries teeth # 3, 5, 6, 8, 9, 10, 11, 12. Palatal torus. Hyperplastic left osseous tuberosity impinging on lower ridge.    Assessment: ASA 2. Non-restorable teeth # 3, 5, 6, 8, 9, 10, 11, 12. Palatal torus. Hyperplastic left osseous tuberosity.               Plan: Extraction Teeth # 3, 5, 6, 8, 9, 10, 11, 12. Alveoloplasty, removal Palatal torus. reduction Hyperplastic left osseous tuberosity.   Hospital Day surgery.                 Rx: n               Risks and complications explained. Questions answered.   Georgia Lopes, DMD

## 2022-07-02 ENCOUNTER — Other Ambulatory Visit: Payer: Self-pay

## 2022-07-02 ENCOUNTER — Encounter (HOSPITAL_COMMUNITY): Payer: Self-pay | Admitting: Oral Surgery

## 2022-07-02 NOTE — Progress Notes (Signed)
Ms Julia Shaw denies chest pain or shortness of breath. Patient denies having any s/s of Covid in her household, also denies any known exposure to Covid. Ms Julia Shaw denies  any s/s of upper or lower respiratory in the past 8 weeks.   Ms Julia Shaw's PCP is Dr. Leilani Able.

## 2022-07-05 ENCOUNTER — Encounter (HOSPITAL_COMMUNITY)
Admission: RE | Disposition: A | Discharge status: Home / Self Care | Payer: Self-pay | Source: Home / Self Care | Attending: Oral Surgery

## 2022-07-05 ENCOUNTER — Other Ambulatory Visit: Payer: Self-pay

## 2022-07-05 ENCOUNTER — Encounter (HOSPITAL_COMMUNITY): Payer: Self-pay | Admitting: Oral Surgery

## 2022-07-05 ENCOUNTER — Ambulatory Visit (HOSPITAL_BASED_OUTPATIENT_CLINIC_OR_DEPARTMENT_OTHER): Payer: Medicaid Other | Admitting: Anesthesiology

## 2022-07-05 ENCOUNTER — Ambulatory Visit (HOSPITAL_COMMUNITY): Payer: Medicaid Other | Admitting: Anesthesiology

## 2022-07-05 ENCOUNTER — Ambulatory Visit (HOSPITAL_COMMUNITY)
Admission: RE | Admit: 2022-07-05 | Discharge: 2022-07-05 | Disposition: A | Discharge status: Home / Self Care | Payer: Medicaid Other | Attending: Oral Surgery | Admitting: Oral Surgery

## 2022-07-05 DIAGNOSIS — K0850 Unsatisfactory restoration of tooth, unspecified: Secondary | ICD-10-CM

## 2022-07-05 DIAGNOSIS — J45909 Unspecified asthma, uncomplicated: Secondary | ICD-10-CM | POA: Insufficient documentation

## 2022-07-05 DIAGNOSIS — M2607 Excessive tuberosity of jaw: Secondary | ICD-10-CM

## 2022-07-05 DIAGNOSIS — D649 Anemia, unspecified: Secondary | ICD-10-CM

## 2022-07-05 DIAGNOSIS — Z6838 Body mass index (BMI) 38.0-38.9, adult: Secondary | ICD-10-CM | POA: Diagnosis not present

## 2022-07-05 DIAGNOSIS — M270 Developmental disorders of jaws: Secondary | ICD-10-CM

## 2022-07-05 DIAGNOSIS — K029 Dental caries, unspecified: Secondary | ICD-10-CM | POA: Insufficient documentation

## 2022-07-05 DIAGNOSIS — E669 Obesity, unspecified: Secondary | ICD-10-CM | POA: Diagnosis not present

## 2022-07-05 HISTORY — PX: TOOTH EXTRACTION: SHX859

## 2022-07-05 HISTORY — PX: ALVEOLOPLASTY: SHX5710

## 2022-07-05 HISTORY — PX: EXCISION PERITONSILLAR CYST: SHX6266

## 2022-07-05 LAB — POCT PREGNANCY, URINE: Preg Test, Ur: NEGATIVE

## 2022-07-05 SURGERY — DENTAL RESTORATION/EXTRACTIONS
Anesthesia: General

## 2022-07-05 MED ORDER — SUGAMMADEX SODIUM 200 MG/2ML IV SOLN
INTRAVENOUS | Status: DC | PRN
  Administered 2022-07-05: 200 mg via INTRAVENOUS

## 2022-07-05 MED ORDER — DEXAMETHASONE SODIUM PHOSPHATE 10 MG/ML IJ SOLN
INTRAMUSCULAR | Status: DC | PRN
  Administered 2022-07-05: 10 mg via INTRAVENOUS

## 2022-07-05 MED ORDER — GLYCOPYRROLATE PF 0.2 MG/ML IJ SOSY
PREFILLED_SYRINGE | INTRAMUSCULAR | Status: DC | PRN
  Administered 2022-07-05: .1 mg via INTRAVENOUS

## 2022-07-05 MED ORDER — PROPOFOL 10 MG/ML IV BOLUS
INTRAVENOUS | Status: AC
  Filled 2022-07-05: qty 20

## 2022-07-05 MED ORDER — ALBUTEROL SULFATE HFA 108 (90 BASE) MCG/ACT IN AERS
INHALATION_SPRAY | RESPIRATORY_TRACT | Status: DC | PRN
  Administered 2022-07-05: 12 via RESPIRATORY_TRACT
  Administered 2022-07-05: 6 via RESPIRATORY_TRACT

## 2022-07-05 MED ORDER — FENTANYL CITRATE (PF) 250 MCG/5ML IJ SOLN
INTRAMUSCULAR | Status: DC | PRN
  Administered 2022-07-05: 100 ug via INTRAVENOUS

## 2022-07-05 MED ORDER — CEFAZOLIN SODIUM-DEXTROSE 2-4 GM/100ML-% IV SOLN
2.0000 g | INTRAVENOUS | Status: AC
  Administered 2022-07-05: 2 g via INTRAVENOUS

## 2022-07-05 MED ORDER — DEXMEDETOMIDINE HCL IN NACL 80 MCG/20ML IV SOLN
INTRAVENOUS | Status: DC | PRN
  Administered 2022-07-05: 10 ug via INTRAVENOUS

## 2022-07-05 MED ORDER — AMISULPRIDE (ANTIEMETIC) 5 MG/2ML IV SOLN: 10.0000 mg | Freq: Once | INTRAVENOUS | Status: DC | PRN

## 2022-07-05 MED ORDER — LIDOCAINE-EPINEPHRINE 2 %-1:100000 IJ SOLN
INTRAMUSCULAR | Status: DC | PRN
  Administered 2022-07-05: 16 mL via INTRADERMAL

## 2022-07-05 MED ORDER — LIDOCAINE-EPINEPHRINE 2 %-1:100000 IJ SOLN
INTRAMUSCULAR | Status: AC
  Filled 2022-07-05: qty 1

## 2022-07-05 MED ORDER — ONDANSETRON HCL 4 MG/2ML IJ SOLN
INTRAMUSCULAR | Status: DC | PRN
  Administered 2022-07-05: 4 mg via INTRAVENOUS

## 2022-07-05 MED ORDER — FENTANYL CITRATE (PF) 250 MCG/5ML IJ SOLN
INTRAMUSCULAR | Status: AC
  Filled 2022-07-05: qty 5

## 2022-07-05 MED ORDER — CHLORHEXIDINE GLUCONATE 0.12 % MT SOLN
OROMUCOSAL | Status: AC
  Administered 2022-07-05: 15 mL via OROMUCOSAL
  Filled 2022-07-05: qty 15

## 2022-07-05 MED ORDER — OXYCODONE HCL 5 MG/5ML PO SOLN: 5.0000 mg | Freq: Once | ORAL | Status: DC | PRN

## 2022-07-05 MED ORDER — LACTATED RINGERS IV SOLN: INTRAVENOUS | Status: DC

## 2022-07-05 MED ORDER — PHENYLEPHRINE HCL-NACL 20-0.9 MG/250ML-% IV SOLN
INTRAVENOUS | Status: DC | PRN
  Administered 2022-07-05 (×2): 80 ug via INTRAVENOUS
  Administered 2022-07-05: 160 ug via INTRAVENOUS
  Administered 2022-07-05: 80 ug via INTRAVENOUS
  Administered 2022-07-05 (×2): 160 ug via INTRAVENOUS
  Administered 2022-07-05 (×2): 80 ug via INTRAVENOUS
  Administered 2022-07-05: 160 ug via INTRAVENOUS

## 2022-07-05 MED ORDER — CHLORHEXIDINE GLUCONATE 0.12 % MT SOLN: 15.0000 mL | Freq: Once | OROMUCOSAL | Status: AC

## 2022-07-05 MED ORDER — OXYCODONE HCL 5 MG PO TABS: 5.0000 mg | ORAL_TABLET | Freq: Once | ORAL | Status: DC | PRN

## 2022-07-05 MED ORDER — LIDOCAINE 2% (20 MG/ML) 5 ML SYRINGE
INTRAMUSCULAR | Status: DC | PRN
  Administered 2022-07-05: 100 mg via INTRAVENOUS

## 2022-07-05 MED ORDER — ORAL CARE MOUTH RINSE: 15.0000 mL | Freq: Once | OROMUCOSAL | Status: AC

## 2022-07-05 MED ORDER — ROCURONIUM BROMIDE 10 MG/ML (PF) SYRINGE
PREFILLED_SYRINGE | INTRAVENOUS | Status: DC | PRN
  Administered 2022-07-05: 60 mg via INTRAVENOUS

## 2022-07-05 MED ORDER — PROPOFOL 10 MG/ML IV BOLUS
INTRAVENOUS | Status: DC | PRN
  Administered 2022-07-05: 200 mg via INTRAVENOUS
  Administered 2022-07-05: 60 mg via INTRAVENOUS

## 2022-07-05 MED ORDER — OXYMETAZOLINE HCL 0.05 % NA SOLN
NASAL | Status: AC
  Filled 2022-07-05: qty 60

## 2022-07-05 MED ORDER — ACETAMINOPHEN 500 MG PO TABS
1000.0000 mg | ORAL_TABLET | Freq: Once | ORAL | Status: AC
  Administered 2022-07-05: 1000 mg via ORAL
  Filled 2022-07-05: qty 2

## 2022-07-05 MED ORDER — HYDROCODONE-ACETAMINOPHEN 5-325 MG PO TABS: 1.0000 | ORAL_TABLET | ORAL | 0 refills | Status: DC | PRN

## 2022-07-05 MED ORDER — FENTANYL CITRATE (PF) 100 MCG/2ML IJ SOLN: 25.0000 ug | INTRAMUSCULAR | Status: DC | PRN

## 2022-07-05 MED ORDER — AMOXICILLIN 500 MG PO CAPS: 500.0000 mg | ORAL_CAPSULE | Freq: Three times a day (TID) | ORAL | 0 refills | Status: DC

## 2022-07-05 MED ORDER — CEFAZOLIN SODIUM-DEXTROSE 2-4 GM/100ML-% IV SOLN
INTRAVENOUS | Status: AC
  Filled 2022-07-05: qty 100

## 2022-07-05 MED ORDER — SCOPOLAMINE 1 MG/3DAYS TD PT72
1.0000 | MEDICATED_PATCH | Freq: Once | TRANSDERMAL | Status: DC
  Administered 2022-07-05: 1.5 mg via TRANSDERMAL
  Filled 2022-07-05: qty 1

## 2022-07-05 MED ORDER — MIDAZOLAM HCL 2 MG/2ML IJ SOLN
INTRAMUSCULAR | Status: AC
  Filled 2022-07-05: qty 2

## 2022-07-05 SURGICAL SUPPLY — 36 items
BAG COUNTER SPONGE SURGICOUNT (BAG) IMPLANT
BAG SPNG CNTER NS LX DISP (BAG)
BLADE SURG 15 STRL LF DISP TIS (BLADE) ×2 IMPLANT
BLADE SURG 15 STRL SS (BLADE) ×2
BUR CROSS CUT FISSURE 1.6 (BURR) ×2 IMPLANT
BUR EGG ELITE 4.0 (BURR) ×2 IMPLANT
CANISTER SUCT 3000ML PPV (MISCELLANEOUS) ×2 IMPLANT
COVER SURGICAL LIGHT HANDLE (MISCELLANEOUS) ×2 IMPLANT
GAUZE PACKING FOLDED 2 STR (GAUZE/BANDAGES/DRESSINGS) ×2 IMPLANT
GLOVE BIO SURGEON STRL SZ 6.5 (GLOVE) IMPLANT
GLOVE BIO SURGEON STRL SZ7 (GLOVE) IMPLANT
GLOVE BIO SURGEON STRL SZ8 (GLOVE) ×2 IMPLANT
GLOVE BIOGEL PI IND STRL 6.5 (GLOVE) IMPLANT
GLOVE BIOGEL PI IND STRL 7.0 (GLOVE) IMPLANT
GOWN STRL REUS W/ TWL LRG LVL3 (GOWN DISPOSABLE) ×2 IMPLANT
GOWN STRL REUS W/ TWL XL LVL3 (GOWN DISPOSABLE) ×2 IMPLANT
GOWN STRL REUS W/TWL LRG LVL3 (GOWN DISPOSABLE) ×2
GOWN STRL REUS W/TWL XL LVL3 (GOWN DISPOSABLE) ×2
IV NS 1000ML (IV SOLUTION) ×2
IV NS 1000ML BAXH (IV SOLUTION) ×2 IMPLANT
KIT BASIN OR (CUSTOM PROCEDURE TRAY) ×2 IMPLANT
KIT TURNOVER KIT B (KITS) ×2 IMPLANT
NDL HYPO 25GX1X1/2 BEV (NEEDLE) ×4 IMPLANT
NEEDLE HYPO 25GX1X1/2 BEV (NEEDLE) ×4 IMPLANT
NS IRRIG 1000ML POUR BTL (IV SOLUTION) ×2 IMPLANT
PAD ARMBOARD 7.5X6 YLW CONV (MISCELLANEOUS) ×2 IMPLANT
SLEEVE IRRIGATION ELITE 7 (MISCELLANEOUS) ×2 IMPLANT
SPIKE FLUID TRANSFER (MISCELLANEOUS) ×2 IMPLANT
SPONGE SURGIFOAM ABS GEL 12-7 (HEMOSTASIS) IMPLANT
SUT CHROMIC 3 0 PS 2 (SUTURE) ×2 IMPLANT
SUT CHROMIC 4 0 P 3 18 (SUTURE) IMPLANT
SYR BULB IRRIG 60ML STRL (SYRINGE) ×2 IMPLANT
SYR CONTROL 10ML LL (SYRINGE) ×2 IMPLANT
TRAY ENT MC OR (CUSTOM PROCEDURE TRAY) ×2 IMPLANT
TUBING IRRIGATION (MISCELLANEOUS) ×2 IMPLANT
YANKAUER SUCT BULB TIP NO VENT (SUCTIONS) ×2 IMPLANT

## 2022-07-05 NOTE — Op Note (Unsigned)
NAME: Julia Shaw, Julia Shaw MEDICAL RECORD NO: [id number] ACCOUNT NO: [id number] DATE OF BIRTH: October 26, 1975 FACILITY: [facility] LOCATION: [facility] PHYSICIAN: Georgia Lopes, DDS  Operative Report   DATE OF PROCEDURE: 07/05/2022  PREOPERATIVE DIAGNOSIS:  Nonrestorable teeth numbers 3, 5, 6, 8, 9, 10, 11, 12 secondary to dental caries, hyperplastic left maxillary osseous tuberosity, palatal torus.  PROCEDURE:  Removal of teeth numbers 3, 5, 6, 8, 9, 10, 11, 12, alveoloplasty, right and left maxilla, reduction hyperplastic left maxillary osseous tuberosity.  Removal of palatal torus.  SURGEON:  Georgia Lopes, DDS  ANESTHESIA:  General, Dr. Collins Scotland was attending.  DESCRIPTION OF PROCEDURE:  The patient was taken to the operating room and placed on the table in supine position.  General anesthesia was administered and nasal endotracheal tube was placed and secured.  The eyes were protected.  The patient was draped  for surgery.  Timeout was performed.  The posterior pharynx was suctioned and a throat pack was placed.  2% lidocaine 1:100,000 epinephrine was infiltrated buccally and palatally in the maxilla and around the palatal torus and then a bite block was  placed in the right side of the mouth, the left side was operated first.  A 15 blade was used to make an incision overlying the hyperplastic left osseous tuberosity.  The incision was carried forward on the edentulous portion of the maxilla until tooth  #12 was encountered.  Then, the incision was created buccally and palatally around teeth numbers 12, 11, 10, 9, 8.  The periosteum was reflected, Stryker handpiece was used to reduce the bony contour of the tuberosity until it was not so bulbous. The  bite block was taken out and the bite was checked and there was indeed room between the upper arch and the lower arch for denture.  Then, attention was turned to removing the teeth.  The 301 elevator was used to elevate teeth numbers 12, 11, 10, 9 and  8.  Teeth numbers 10, 9 and 8 were removed with dental forceps; however, the teeth numbers 11 and 12 could not be removed.  The Stryker handpiece was used to remove interproximal bone and then the teeth were elevated and removed with the dental forceps.   The periosteum was reflected and alveoplasty was performed as the anterior bone was quite thin along the root surfaces and was ankylosed to multiple teeth causing irregular contour.  The alveoplasty was performed with the egg bur and the Stryker  handpiece and then the bone file was used to further smooth the area.  Then, the left maxilla was closed.  Then, attention was turned to the palatal torus.  A double Y incision was created with a horizontal incision over the midline of the torus from  posterior to anterior.  The tissue was reflected to expose the torus and a portion of the regular palatal bone.  The Seldin retractor was used to protect the palatal tissues.  The Stryker handpiece with the egg bur was used to reduce the torus and then  the bone file was used to further smooth the area.  Then, this area was irrigated and closed with a 4-0 chromic.  Then, the remainder of the teeth were removed. Teeth numbers 3, 5 and 6 were removed using the Stryker handpiece. Tooth #3 was sectioned and  teeth numbers 5 and 6 were removed with the forceps after removing bone around the tooth roots.  Then, the area was irrigated and the tissue was reflected to expose the alveolar  crest, which was irregular in contour and alveoplasty was performed using  the egg bur followed by the bone file.  Then, the area was irrigated and closed with 3-0 chromic.  The oral cavity was then inspected and found to have good contour, hemostasis and closure.  The oral cavity was irrigated and suctioned.  The throat pack  was removed.  The patient was left under the care of anesthesia for extubation and transported to recovery room with plans for discharge home through day  surgery.  ESTIMATED BLOOD LOSS:  Minimum.  COMPLICATIONS:  None.  SPECIMENS:  None.  COUNTS:  Correct.   PUS D: 07/05/2022 2:07:20 pm T: 07/05/2022 2:27:00 pm  JOB: [id number]/ [id number]

## 2022-07-05 NOTE — Anesthesia Preprocedure Evaluation (Addendum)
Anesthesia Evaluation  Patient identified by MRN, date of birth, ID band Patient awake    Reviewed: Allergy & Precautions, NPO status , Patient's Chart, lab work & pertinent test results  History of Anesthesia Complications Negative for: history of anesthetic complications  Airway Mallampati: II  TM Distance: >3 FB Neck ROM: Full    Dental  (+) Poor Dentition   Pulmonary asthma    Pulmonary exam normal        Cardiovascular negative cardio ROS Normal cardiovascular exam     Neuro/Psych negative neurological ROS     GI/Hepatic negative GI ROS, Neg liver ROS,,,  Endo/Other  negative endocrine ROS  BMI 39  Renal/GU negative Renal ROS  negative genitourinary   Musculoskeletal negative musculoskeletal ROS (+)    Abdominal   Peds  Hematology  (+) Blood dyscrasia, anemia   Anesthesia Other Findings Dental caries  Reproductive/Obstetrics negative OB ROS                             Anesthesia Physical Anesthesia Plan  ASA: 2  Anesthesia Plan: General   Post-op Pain Management: Tylenol PO (pre-op)*   Induction: Intravenous  PONV Risk Score and Plan: 3 and Treatment may vary due to age or medical condition, Ondansetron, Dexamethasone, Midazolam and Scopolamine patch - Pre-op  Airway Management Planned: Nasal ETT  Additional Equipment: None  Intra-op Plan:   Post-operative Plan: Extubation in OR  Informed Consent: I have reviewed the patients History and Physical, chart, labs and discussed the procedure including the risks, benefits and alternatives for the proposed anesthesia with the patient or authorized representative who has indicated his/her understanding and acceptance.     Dental advisory given  Plan Discussed with: CRNA  Anesthesia Plan Comments:        Anesthesia Quick Evaluation

## 2022-07-05 NOTE — Progress Notes (Signed)
Pt states she had lab work at her PCP recently. I called her PCP- per RN pt had normal CBC and CMP on 07/02/22. Per nurse on phone, Potassium 4.3, Creatinine 0.65 and Hgb 12.1. RN will fax results to pre-op. Dr. Stephannie Peters notified. Per MD no lab work needed for surgery today.

## 2022-07-05 NOTE — H&P (Signed)
H&P documentation  -History and Physical Reviewed  -Patient has been re-examined  -No change in the plan of care  Julia Shaw  

## 2022-07-05 NOTE — Anesthesia Procedure Notes (Signed)
Procedure Name: Intubation Date/Time: 07/05/2022 12:48 PM  Performed by: Loleta Aodhan Scheidt, CRNAPre-anesthesia Checklist: Patient identified, Emergency Drugs available, Suction available and Patient being monitored Patient Re-evaluated:Patient Re-evaluated prior to induction Oxygen Delivery Method: Circle system utilized Preoxygenation: Pre-oxygenation with 100% oxygen Induction Type: IV induction Ventilation: Mask ventilation without difficulty Laryngoscope Size: Glidescope and 4 Nasal Tubes: Nasal prep performed, Nasal Rae and Magill forceps- large, utilized Number of attempts: 1 Placement Confirmation: ETT inserted through vocal cords under direct vision, positive ETCO2 and breath sounds checked- equal and bilateral Tube secured with: Tape Dental Injury: Teeth and Oropharynx as per pre-operative assessment

## 2022-07-05 NOTE — Op Note (Signed)
07/05/2022  2:01 PM  PATIENT:  ALEXSYS TETTER  47 y.o. female  PRE-OPERATIVE DIAGNOSIS:  NON-RESTORABLE TEETH # 3, 5, 6, 8, 9, 10, 11, 12, SECONDARY TO DENTAL CARIES; HYPERPLASTIC LEFT MAXILLARY OSSEOUS TUBEROSITY, PALATAL TORUS.  POST-OPERATIVE DIAGNOSIS:  SAME  PROCEDURE:  Procedure(s): EXTRACTIONS TEETH # 3, 5, 6, 8, 9, 10, 11, 12, ALVEOLOPLASTY RIGHT AND LEFTG MAXILLA, ; REDUCTION HYPERPLASTIC LEFT MAXILLARY OSSEOUS TUBEROSITY, REMOVAL PALATAL TORUS.  SURGEON:  Surgeon(s): Ocie Doyne, DMD  ANESTHESIA:   local and general  EBL:  minimal  DRAINS: none   SPECIMEN:  No Specimen  COUNTS:  YES  PLAN OF CARE: Discharge to home after PACU  PATIENT DISPOSITION:  PACU - hemodynamically stable.   PROCEDURE DETAILS: Dictation # [id number]  Georgia Lopes, DMD 07/05/2022 2:01 PM

## 2022-07-05 NOTE — Anesthesia Postprocedure Evaluation (Signed)
Anesthesia Post Note  Patient: Julia Shaw  Procedure(s) Performed: DENTAL RESTORATION/EXTRACTIONS     Patient location during evaluation: PACU Anesthesia Type: General Level of consciousness: awake and alert Pain management: pain level controlled Vital Signs Assessment: post-procedure vital signs reviewed and stable Respiratory status: spontaneous breathing, nonlabored ventilation and respiratory function stable Cardiovascular status: blood pressure returned to baseline Postop Assessment: no apparent nausea or vomiting Anesthetic complications: no   No notable events documented.  Last Vitals:  Vitals:   07/05/22 1417 07/05/22 1430  BP: 117/82 126/89  Pulse: 98 97  Resp: 16 15  Temp: (!) 36.4 C   SpO2: 93% 95%    Last Pain:  Vitals:   07/05/22 1417  TempSrc:   PainSc: Asleep                 Shanda Howells

## 2022-07-05 NOTE — Transfer of Care (Signed)
Immediate Anesthesia Transfer of Care Note  Patient: Julia Shaw  Procedure(s) Performed: DENTAL RESTORATION/EXTRACTIONS  Patient Location: PACU  Anesthesia Type:General  Level of Consciousness: drowsy  Airway & Oxygen Therapy: Patient Spontanous Breathing  Post-op Assessment: Report given to RN and Post -op Vital signs reviewed and stable  Post vital signs: Reviewed and stable  Last Vitals:  Vitals Value Taken Time  BP 117/82 (94)   Temp    Pulse 101   Resp 12   SpO2 92     Last Pain:  Vitals:   07/05/22 1014  TempSrc:   PainSc: 0-No pain         Complications: No notable events documented.

## 2022-07-06 ENCOUNTER — Encounter (HOSPITAL_COMMUNITY): Payer: Self-pay | Admitting: Oral Surgery

## 2022-10-17 ENCOUNTER — Encounter (HOSPITAL_COMMUNITY): Payer: Self-pay | Admitting: *Deleted

## 2022-10-17 ENCOUNTER — Ambulatory Visit (HOSPITAL_COMMUNITY)
Admission: EM | Admit: 2022-10-17 | Discharge: 2022-10-17 | Disposition: A | Discharge status: Home / Self Care | Payer: Medicaid Other | Attending: Emergency Medicine | Admitting: Emergency Medicine

## 2022-10-17 ENCOUNTER — Other Ambulatory Visit: Payer: Self-pay

## 2022-10-17 DIAGNOSIS — N3001 Acute cystitis with hematuria: Secondary | ICD-10-CM | POA: Diagnosis present

## 2022-10-17 DIAGNOSIS — M25551 Pain in right hip: Secondary | ICD-10-CM

## 2022-10-17 DIAGNOSIS — K5900 Constipation, unspecified: Secondary | ICD-10-CM | POA: Diagnosis present

## 2022-10-17 LAB — POCT URINE PREGNANCY: Preg Test, Ur: NEGATIVE

## 2022-10-17 LAB — POCT URINALYSIS DIP (MANUAL ENTRY)
Bilirubin, UA: NEGATIVE
Glucose, UA: NEGATIVE mg/dL
Ketones, POC UA: NEGATIVE mg/dL
Nitrite, UA: POSITIVE — AB
Protein Ur, POC: 30 mg/dL — AB
Spec Grav, UA: 1.02 (ref 1.010–1.025)
Urobilinogen, UA: 1 U/dL
pH, UA: 7 (ref 5.0–8.0)

## 2022-10-17 MED ORDER — IBUPROFEN 800 MG PO TABS: 800.0000 mg | ORAL_TABLET | Freq: Three times a day (TID) | ORAL | 0 refills | Status: DC | PRN

## 2022-10-17 MED ORDER — POLYETHYLENE GLYCOL 3350 17 G PO PACK: 17.0000 g | PACK | Freq: Every day | ORAL | 0 refills | Status: DC

## 2022-10-17 MED ORDER — KETOROLAC TROMETHAMINE 30 MG/ML IJ SOLN
30.0000 mg | Freq: Once | INTRAMUSCULAR | Status: AC
  Administered 2022-10-17: 30 mg via INTRAMUSCULAR

## 2022-10-17 MED ORDER — NITROFURANTOIN MONOHYD MACRO 100 MG PO CAPS: 100.0000 mg | ORAL_CAPSULE | Freq: Two times a day (BID) | ORAL | 0 refills | Status: DC

## 2022-10-17 MED ORDER — KETOROLAC TROMETHAMINE 30 MG/ML IJ SOLN
INTRAMUSCULAR | Status: AC
  Filled 2022-10-17: qty 1

## 2022-10-17 NOTE — ED Provider Notes (Signed)
[facility]    CSN: [id number] Arrival date & time: 10/17/22  1007      History   Chief Complaint Chief Complaint  Patient presents with   Hip Pain   Abdominal Pain    HPI Julia Shaw is a 47 y.o. female.   Patient presents with right hip pain that radiates down right thigh x 3 to 4 days. Reports history of arthritis. Patient also reports central abdominal pain over the last 2 to 3 days.  Denies nausea, vomiting, diarrhea, hematuria, dysuria, and back pain.   Hip Pain Associated symptoms include abdominal pain. Pertinent negatives include no chest pain and no headaches.  Abdominal Pain Associated symptoms: constipation   Associated symptoms: no chest pain, no chills, no diarrhea, no dysuria, no fatigue, no fever, no hematuria, no nausea, no vaginal bleeding, no vaginal discharge and no vomiting     Past Medical History:  Diagnosis Date   Allergy    Anemia    Arthritis    Asthma    Breast lesion    Left   Bronchitis    Fibroid    Hx of trichomoniasis    Papilloma of breast    left    Patient Active Problem List   Diagnosis Date Noted   Asthma 08/30/2015   Allergy 08/30/2015   Fibroid, uterus 03/03/2011    Past Surgical History:  Procedure Laterality Date   ALVEOLOPLASTY  07/05/2022   Procedure: ALVEOLOPLASTY;  Surgeon: Ocie Doyne, DMD;  Location: [facility];  Service: Oral Surgery;;   BREAST LUMPECTOMY WITH RADIOACTIVE SEED LOCALIZATION Left 11/01/2016   Procedure: LEFT BREAST RADIOACTIVE SEED GUIDED LUMPECTOMY;  Surgeon: Griselda Miner, MD;  Location: [facility];  Service: General;  Laterality: Left;   BUNIONECTOMY     right foot   DENTAL SURGERY     EXCISION PERITONSILLAR CYST  07/05/2022   Procedure: EXCISION PALATABLE TORES;  Surgeon: Ocie Doyne, DMD;  Location: [facility];  Service: Oral Surgery;;   ROBOT ASSISTED MYOMECTOMY  03/03/2011   Procedure: ROBOTIC ASSISTED MYOMECTOMY;  Surgeon: Esmeralda Arthur, MD;  Location: [facility];  Service:  Gynecology;  Laterality: N/A;   TOOTH EXTRACTION N/A 07/05/2022   Procedure: DENTAL RESTORATION/EXTRACTIONS;  Surgeon: Ocie Doyne, DMD;  Location: [facility];  Service: Oral Surgery;  Laterality: N/A;   TUBAL LIGATION      OB History     Gravida  3   Para  2   Term  2   Preterm      AB  1   Living  2      SAB  1   IAB      Ectopic      Multiple      Live Births  2            Home Medications    Prior to Admission medications   Medication Sig Start Date End Date Taking? Authorizing Provider  ibuprofen (ADVIL) 800 MG tablet Take 1 tablet (800 mg total) by mouth 3 (three) times daily as needed for moderate pain or mild pain. 10/17/22  Yes Susann Givens, Mikeala Girdler A, NP  nitrofurantoin, macrocrystal-monohydrate, (MACROBID) 100 MG capsule Take 1 capsule (100 mg total) by mouth 2 (two) times daily. 10/17/22  Yes Susann Givens, Gabor Lusk A, NP  polyethylene glycol (MIRALAX) 17 g packet Take 17 g by mouth daily. 10/17/22  Yes Susann Givens, Harshal Sirmon A, NP  albuterol (VENTOLIN HFA) 108 (90 Base) MCG/ACT inhaler Inhale 1-2 puffs into the lungs every 6 (  six) hours as needed for wheezing or shortness of breath.    [provider]  HYDROcodone-acetaminophen (NORCO) 5-325 MG tablet Take 1 tablet by mouth every 4 (four) hours as needed for moderate pain. 07/05/22   Ocie Doyne, DMD  DULERA 100-5 MCG/ACT AERO Inhale 2 puffs into the lungs every 6 (six) hours as needed for wheezing.  08/14/15 06/27/19  [provider]  fluticasone (FLONASE) 50 MCG/ACT nasal spray Place 2 sprays into both nostrils daily. 05/04/19 06/27/19  Dartha Lodge, PA-C    Family History Family History  Problem Relation Age of Onset   Diabetes Mother    Hypertension Mother    Thyroid disease Father    Heart disease Father    Hyperlipidemia Father    Cancer Maternal Grandmother     Social History Social History   Tobacco Use   Smoking status: Never   Smokeless tobacco: Never  Vaping Use   Vaping status: Never  Used  Substance Use Topics   Alcohol use: No   Drug use: No     Allergies   Patient has no known allergies.   Review of Systems Review of Systems  Constitutional:  Negative for chills, fatigue and fever.  Cardiovascular:  Negative for chest pain.  Gastrointestinal:  Positive for abdominal pain and constipation. Negative for diarrhea, nausea and vomiting.  Genitourinary:  Positive for pelvic pain. Negative for difficulty urinating, dysuria, flank pain, frequency, hematuria, urgency, vaginal bleeding, vaginal discharge and vaginal pain.  Musculoskeletal:  Positive for gait problem. Negative for back pain.  Skin:  Negative for color change.  Neurological:  Negative for dizziness, syncope, weakness, light-headedness, numbness and headaches.     Physical Exam Triage Vital Signs ED Triage Vitals  Encounter Vitals Group     BP 10/17/22 1022 112/78     Systolic BP Percentile --      Diastolic BP Percentile --      Pulse Rate 10/17/22 1022 93     Resp 10/17/22 1022 18     Temp 10/17/22 1022 (!) 97.5 F (36.4 C)     Temp Source 10/17/22 1022 Oral     SpO2 10/17/22 1022 95 %     Weight --      Height --      Head Circumference --      Peak Flow --      Pain Score 10/17/22 1023 9     Pain Loc --      Pain Education --      Exclude from Growth Chart --    No data found.  Updated Vital Signs BP 112/78   Pulse 93   Temp (!) 97.5 F (36.4 C) (Oral)   Resp 18   LMP 10/06/2022 (Approximate)   SpO2 95%   Visual Acuity Right Eye Distance:   Left Eye Distance:   Bilateral Distance:    Right Eye Near:   Left Eye Near:    Bilateral Near:     Physical Exam Vitals and nursing note reviewed.  Constitutional:      General: She is awake. She is not in acute distress.    Appearance: Normal appearance. She is well-developed and well-groomed. She is not ill-appearing, toxic-appearing or diaphoretic.  Cardiovascular:     Rate and Rhythm: Normal rate.     Heart sounds: Normal  heart sounds.  Pulmonary:     Effort: Pulmonary effort is normal. No respiratory distress.     Breath sounds: Normal breath sounds. No wheezing.  Abdominal:     General: Abdomen is flat. Bowel sounds are decreased. There is no distension.     Palpations: Abdomen is soft. There is no mass.     Tenderness: There is abdominal tenderness in the right upper quadrant, right lower quadrant, epigastric area, periumbilical area and suprapubic area. There is no right CVA tenderness, left CVA tenderness, guarding or rebound. Negative signs include Murphy's sign, Rovsing's sign and McBurney's sign.     Hernia: No hernia is present.     Comments: Mild tenderness to R and mid abdomen upon palpation with hypoactive bowel sounds.   Musculoskeletal:     Cervical back: Normal.     Thoracic back: Normal.     Lumbar back: Tenderness present. No swelling, edema, deformity or bony tenderness. Normal range of motion.     Right hip: Tenderness present. No deformity. Decreased range of motion.     Left hip: Normal.     Right upper leg: Normal.     Left upper leg: Normal.     Right knee: Normal.     Left knee: Normal.     Comments: Mild tenderness to R lumbar region and hip with mildly decreased ROM. Walking with slight limp.   Skin:    General: Skin is warm and dry.  Neurological:     Mental Status: She is alert.  Psychiatric:        Behavior: Behavior is cooperative.      UC Treatments / Results  Labs (all labs ordered are listed, but only abnormal results are displayed) Labs Reviewed  POCT URINALYSIS DIP (MANUAL ENTRY) - Abnormal; Notable for the following components:      Result Value   Color, UA straw (*)    Clarity, UA hazy (*)    Blood, UA moderate (*)    Protein Ur, POC =30 (*)    Nitrite, UA Positive (*)    Leukocytes, UA Large (3+) (*)    All other components within normal limits  URINE CULTURE  POCT URINE PREGNANCY    EKG   Radiology No results found.  Procedures Procedures  (including critical care time)  Medications Ordered in UC Medications  ketorolac (TORADOL) 30 MG/ML injection 30 mg (30 mg Intramuscular Given 10/17/22 1111)    Initial Impression / Assessment and Plan / UC Course  I have reviewed the triage vital signs and the nursing notes.  Pertinent labs & imaging results that were available during my care of the patient were reviewed by me and considered in my medical decision making (see chart for details).     Patient presented with right hip pain that radiates down right thigh x 3 to 4 days.  History of arthritis.  Patient also reports central abdominal pain over the last 2 to 3 days.  Patient is unsure of last BM. Denies nausea, vomiting, diarrhea, hematuria, dysuria, and back pain.  Upon assessment patient is mildly tender to right and mid abdomen, right lumbar region and right hip. Hypoactive bowel sounds noted. UA showed large leukocytes, nitrites, and moderate blood. Prescribed Macrobid for UTI coverage. Sent urine culture. Ordered Toradol injection for hip pain. Prescribed ibuprofen per patient request for hip pain. Prescribed MiraLAX for constipation.  Discussed follow-up, return, and emergency department precautions. Final Clinical Impressions(s) / UC Diagnoses   Final diagnoses:  Acute cystitis with hematuria  Right hip pain  Constipation, unspecified constipation type     Discharge Instructions      Please start taking antibiotic as prescribed  for your urinary tract infection. If urine culture results come back positive someone will call you and adjust medication if necessary. You can take Miralax as needed for constipation. You can alternate between Ibuprofen and Tylenol as needed for pain. If you develop worsening abdominal pain, back, blood in urine, or fever please seek immediate medical treatment in the ER. Return here as needed.     ED Prescriptions     Medication Sig Dispense Auth. Provider   nitrofurantoin,  macrocrystal-monohydrate, (MACROBID) 100 MG capsule Take 1 capsule (100 mg total) by mouth 2 (two) times daily. 10 capsule Wynonia Lawman A, NP   ibuprofen (ADVIL) 800 MG tablet Take 1 tablet (800 mg total) by mouth 3 (three) times daily as needed for moderate pain or mild pain. 21 tablet Susann Givens, Grainne Knights A, NP   polyethylene glycol (MIRALAX) 17 g packet Take 17 g by mouth daily. 14 each Letta Kocher, NP      PDMP not reviewed this encounter.   Wynonia Lawman A, NP 10/17/22 1130

## 2022-10-17 NOTE — ED Triage Notes (Signed)
Pt ambulates with limp. C/O right posterior hip and buttock pain radiating down into right thigh onset 3-4 days ago without any known injury. Reports hx arthritis.  Also c/o pain to center of abdomen constantly over past 2-3 days -- "feels like I have gas in there". Denies n/v/d.

## 2022-10-17 NOTE — Discharge Instructions (Signed)
Please start taking antibiotic as prescribed for your urinary tract infection. If urine culture results come back positive someone will call you and adjust medication if necessary. You can take Miralax as needed for constipation. You can alternate between Ibuprofen and Tylenol as needed for pain. If you develop worsening abdominal pain, back, blood in urine, or fever please seek immediate medical treatment in the ER. Return here as needed.

## 2022-10-19 LAB — URINE CULTURE: Culture: >=100000 — AB

## 2022-10-20 ENCOUNTER — Telehealth: Payer: Self-pay

## 2022-10-20 MED ORDER — SULFAMETHOXAZOLE-TRIMETHOPRIM 800-160 MG PO TABS
1.0000 | ORAL_TABLET | Freq: Two times a day (BID) | ORAL | 0 refills | Status: AC
Start: 2022-10-20 — End: 2022-10-23

## 2022-10-20 NOTE — Telephone Encounter (Signed)
Per protocol, pt to dc Macrobid and begin tx with Bactrim.  Attempted to reach patient x1. LVM.  Rx sent to pharmacy on file.

## 2022-12-06 ENCOUNTER — Ambulatory Visit (HOSPITAL_COMMUNITY)
Admission: EM | Admit: 2022-12-06 | Discharge: 2022-12-06 | Disposition: A | Discharge status: Home / Self Care | Payer: Medicaid Other | Attending: Internal Medicine | Admitting: Internal Medicine

## 2022-12-06 ENCOUNTER — Encounter (HOSPITAL_COMMUNITY): Payer: Self-pay

## 2022-12-06 DIAGNOSIS — J4521 Mild intermittent asthma with (acute) exacerbation: Secondary | ICD-10-CM | POA: Diagnosis not present

## 2022-12-06 MED ORDER — MONTELUKAST SODIUM 10 MG PO TABS: 10.0000 mg | ORAL_TABLET | Freq: Every day | ORAL | 0 refills | Status: DC

## 2022-12-06 MED ORDER — ALBUTEROL SULFATE HFA 108 (90 BASE) MCG/ACT IN AERS: 1.0000 | INHALATION_SPRAY | Freq: Four times a day (QID) | RESPIRATORY_TRACT | 0 refills | Status: DC | PRN

## 2022-12-06 MED ORDER — PREDNISONE 20 MG PO TABS: 40.0000 mg | ORAL_TABLET | Freq: Every day | ORAL | 0 refills | Status: AC

## 2022-12-06 MED ORDER — BENZONATATE 100 MG PO CAPS: 100.0000 mg | ORAL_CAPSULE | Freq: Three times a day (TID) | ORAL | 0 refills | Status: DC | PRN

## 2022-12-06 NOTE — ED Provider Notes (Signed)
[facility]    CSN: [id number] Arrival date & time: 12/06/22  1241      History   Chief Complaint Chief Complaint  Patient presents with   Cough    HPI Julia Shaw is a 47 y.o. female with a history of asthma previously on albuterol inhaler comes to urgent care with 4-day history of shortness of breath, chest tightness and wheezing.  Patient's symptoms have been persistent.  She also complains of nonproductive cough.  No fever or chills.  She denies any sore throat, nasal congestion or runny nose.  No fever or chills.  No dizziness, near-syncope or syncopal episode.   HPI  Past Medical History:  Diagnosis Date   Allergy    Anemia    Arthritis    Asthma    Breast lesion    Left   Bronchitis    Fibroid    Hx of trichomoniasis    Papilloma of breast    left    Patient Active Problem List   Diagnosis Date Noted   Asthma 08/30/2015   Allergy 08/30/2015   Uterine leiomyoma 03/03/2011    Past Surgical History:  Procedure Laterality Date   ALVEOLOPLASTY  07/05/2022   Procedure: ALVEOLOPLASTY;  Surgeon: Ocie Doyne, DMD;  Location: [facility];  Service: Oral Surgery;;   BREAST LUMPECTOMY WITH RADIOACTIVE SEED LOCALIZATION Left 11/01/2016   Procedure: LEFT BREAST RADIOACTIVE SEED GUIDED LUMPECTOMY;  Surgeon: Griselda Miner, MD;  Location: [facility];  Service: General;  Laterality: Left;   BUNIONECTOMY     right foot   DENTAL SURGERY     EXCISION PERITONSILLAR CYST  07/05/2022   Procedure: EXCISION PALATABLE TORES;  Surgeon: Ocie Doyne, DMD;  Location: [facility];  Service: Oral Surgery;;   ROBOT ASSISTED MYOMECTOMY  03/03/2011   Procedure: ROBOTIC ASSISTED MYOMECTOMY;  Surgeon: Esmeralda Arthur, MD;  Location: [facility];  Service: Gynecology;  Laterality: N/A;   TOOTH EXTRACTION N/A 07/05/2022   Procedure: DENTAL RESTORATION/EXTRACTIONS;  Surgeon: Ocie Doyne, DMD;  Location: [facility];  Service: Oral Surgery;  Laterality: N/A;   TUBAL LIGATION      OB History      Gravida  3   Para  2   Term  2   Preterm      AB  1   Living  2      SAB  1   IAB      Ectopic      Multiple      Live Births  2            Home Medications    Prior to Admission medications   Medication Sig Start Date End Date Taking? Authorizing Provider  benzonatate (TESSALON PERLES) 100 MG capsule Take 1 capsule (100 mg total) by mouth 3 (three) times daily as needed for cough. 12/06/22  Yes Elman Dettman, Britta Mccreedy, MD  predniSONE (DELTASONE) 20 MG tablet Take 2 tablets (40 mg total) by mouth daily for 5 days. 12/06/22 12/11/22 Yes Masin Shatto, Britta Mccreedy, MD  albuterol (VENTOLIN HFA) 108 (90 Base) MCG/ACT inhaler Inhale 1-2 puffs into the lungs every 6 (six) hours as needed for wheezing or shortness of breath. 12/06/22   Dominga Mcduffie, Britta Mccreedy, MD  montelukast (SINGULAIR) 10 MG tablet Take 1 tablet (10 mg total) by mouth at bedtime. 12/06/22   Tattianna Schnarr, Britta Mccreedy, MD  DULERA 100-5 MCG/ACT AERO Inhale 2 puffs into the lungs every 6 (six) hours as needed for wheezing.  08/14/15 06/27/19  [provider]  fluticasone (FLONASE) 50 MCG/ACT nasal spray Place 2 sprays into both nostrils daily. 05/04/19 06/27/19  Dartha Lodge, PA-C    Family History Family History  Problem Relation Age of Onset   Diabetes Mother    Hypertension Mother    Thyroid disease Father    Heart disease Father    Hyperlipidemia Father    Cancer Maternal Grandmother     Social History Social History   Tobacco Use   Smoking status: Never   Smokeless tobacco: Never  Vaping Use   Vaping status: Never Used  Substance Use Topics   Alcohol use: No   Drug use: No     Allergies   Patient has no known allergies.   Review of Systems Review of Systems As per HPI  Physical Exam Triage Vital Signs ED Triage Vitals  Encounter Vitals Group     BP 12/06/22 1413 120/77     Systolic BP Percentile --      Diastolic BP Percentile --      Pulse Rate 12/06/22 1413 86     Resp 12/06/22 1413 16     Temp  12/06/22 1413 98.6 F (37 C)     Temp Source 12/06/22 1413 Oral     SpO2 12/06/22 1413 97 %     Weight 12/06/22 1413 198 lb (89.8 kg)     Height 12/06/22 1413 5' (1.524 m)     Head Circumference --      Peak Flow --      Pain Score 12/06/22 1412 0     Pain Loc --      Pain Education --      Exclude from Growth Chart --    No data found.  Updated Vital Signs BP 120/77 (BP Location: Left Arm)   Pulse 86   Temp 98.6 F (37 C) (Oral)   Resp 16   Ht 5' (1.524 m)   Wt 89.8 kg   LMP 11/29/2022 (Approximate)   SpO2 97%   BMI 38.67 kg/m   Visual Acuity Right Eye Distance:   Left Eye Distance:   Bilateral Distance:    Right Eye Near:   Left Eye Near:    Bilateral Near:     Physical Exam Vitals and nursing note reviewed.  Constitutional:      General: She is not in acute distress.    Appearance: She is not ill-appearing.  HENT:     Mouth/Throat:     Pharynx: No posterior oropharyngeal erythema.  Cardiovascular:     Rate and Rhythm: Normal rate and regular rhythm.  Pulmonary:     Effort: Pulmonary effort is normal.     Breath sounds: Wheezing present. No rhonchi or rales.  Abdominal:     General: Bowel sounds are normal.     Palpations: Abdomen is soft.  Skin:    General: Skin is warm.     Capillary Refill: Capillary refill takes less than 2 seconds.  Neurological:     General: No focal deficit present.     Mental Status: She is alert.      UC Treatments / Results  Labs (all labs ordered are listed, but only abnormal results are displayed) Labs Reviewed - No data to display  EKG   Radiology No results found.  Procedures Procedures (including critical care time)  Medications Ordered in UC Medications - No data to display  Initial Impression / Assessment and Plan / UC Course  I have reviewed the  triage vital signs and the nursing notes.  Pertinent labs & imaging results that were available during my care of the patient were reviewed by me and  considered in my medical decision making (see chart for details).     1.  Mild intermittent asthma with acute exacerbation: Albuterol inhaler every 4-6 hours as needed Prednisone 40 mg orally daily for 5 days Tessalon Perles as needed for cough Tessalon Perles as needed for cough Humidifier use recommended Return precautions given. Final Clinical Impressions(s) / UC Diagnoses   Final diagnoses:  Mild intermittent asthma with exacerbation     Discharge Instructions      Please maintain adequate hydration Use inhalers as directed Please take medications as prescribed If you have worsening symptoms please return to urgent care to be reevaluated. There is no indication for chest x-ray at this time.   ED Prescriptions     Medication Sig Dispense Auth. Provider   albuterol (VENTOLIN HFA) 108 (90 Base) MCG/ACT inhaler Inhale 1-2 puffs into the lungs every 6 (six) hours as needed for wheezing or shortness of breath. 16 g Mardel Grudzien, Britta Mccreedy, MD   predniSONE (DELTASONE) 20 MG tablet Take 2 tablets (40 mg total) by mouth daily for 5 days. 10 tablet Letha Mirabal, Britta Mccreedy, MD   montelukast (SINGULAIR) 10 MG tablet Take 1 tablet (10 mg total) by mouth at bedtime. 30 tablet Theoplis Garciagarcia, Britta Mccreedy, MD   benzonatate (TESSALON PERLES) 100 MG capsule Take 1 capsule (100 mg total) by mouth 3 (three) times daily as needed for cough. 20 capsule Usher Hedberg, Britta Mccreedy, MD      PDMP not reviewed this encounter.   Merrilee Jansky, MD 12/06/22 [phone]

## 2022-12-06 NOTE — Discharge Instructions (Addendum)
Please maintain adequate hydration Use inhalers as directed Please take medications as prescribed If you have worsening symptoms please return to urgent care to be reevaluated. There is no indication for chest x-ray at this time.

## 2022-12-06 NOTE — ED Triage Notes (Signed)
Patient here today with c/o cough, wheeze, and SOB X 4 days. She has tried taking a OTC cough medicine with no relief. She has a h/o asthma and bronchitis.

## 2022-12-28 ENCOUNTER — Encounter (HOSPITAL_COMMUNITY): Payer: Self-pay | Admitting: Emergency Medicine

## 2022-12-28 ENCOUNTER — Ambulatory Visit (HOSPITAL_COMMUNITY)
Admission: EM | Admit: 2022-12-28 | Discharge: 2022-12-28 | Disposition: A | Discharge status: Home / Self Care | Payer: Medicaid Other | Attending: Emergency Medicine | Admitting: Emergency Medicine

## 2022-12-28 ENCOUNTER — Ambulatory Visit (INDEPENDENT_AMBULATORY_CARE_PROVIDER_SITE_OTHER): Payer: Medicaid Other

## 2022-12-28 DIAGNOSIS — J4521 Mild intermittent asthma with (acute) exacerbation: Secondary | ICD-10-CM

## 2022-12-28 DIAGNOSIS — R051 Acute cough: Secondary | ICD-10-CM

## 2022-12-28 MED ORDER — PREDNISONE 10 MG (21) PO TBPK: ORAL_TABLET | Freq: Every day | ORAL | 0 refills | Status: DC

## 2022-12-28 MED ORDER — IPRATROPIUM-ALBUTEROL 0.5-2.5 (3) MG/3ML IN SOLN
3.0000 mL | Freq: Once | RESPIRATORY_TRACT | Status: AC
  Administered 2022-12-28: 3 mL via RESPIRATORY_TRACT

## 2022-12-28 MED ORDER — MONTELUKAST SODIUM 10 MG PO TABS: 10.0000 mg | ORAL_TABLET | Freq: Every day | ORAL | 1 refills | Status: DC

## 2022-12-28 MED ORDER — IPRATROPIUM-ALBUTEROL 0.5-2.5 (3) MG/3ML IN SOLN
RESPIRATORY_TRACT | Status: AC
  Filled 2022-12-28: qty 3

## 2022-12-28 NOTE — Discharge Instructions (Addendum)
Please use your inhaler 3 times daily (every 6 hours) for the next several days.  Prednisone taper as directed. You will take 6 pills on day 1, 5 pills on day 2, and continue decreasing by 1 pill each day until finished.   Please start taking the Singulair tablet every night before bed  Follow up with your new primary care provider (this is very important!)

## 2022-12-28 NOTE — ED Provider Notes (Signed)
[facility]    CSN: [id number] Arrival date & time: 12/28/22  4098     History   Chief Complaint Chief Complaint  Patient presents with   Cough    HPI Julia Shaw is a 47 y.o. female.  Presents with cough and wheezing, now for 3 weeks Asthma history Seen 11/4 for this, prescribed prednisone and tessalon Symptoms have persisted. Not worsening but not improved. Has been using albuterol inhaler without relief Reports cough keeps her up at night Not having fever or chills No known sick contacts  Denies chest pain or shortness of breath. No nausea or abdominal pain  Past Medical History:  Diagnosis Date   Allergy    Anemia    Arthritis    Asthma    Breast lesion    Left   Bronchitis    Fibroid    Hx of trichomoniasis    Papilloma of breast    left    Patient Active Problem List   Diagnosis Date Noted   Asthma 08/30/2015   Allergy 08/30/2015   Uterine leiomyoma 03/03/2011    Past Surgical History:  Procedure Laterality Date   ALVEOLOPLASTY  07/05/2022   Procedure: ALVEOLOPLASTY;  Surgeon: Ocie Doyne, DMD;  Location: [facility];  Service: Oral Surgery;;   BREAST LUMPECTOMY WITH RADIOACTIVE SEED LOCALIZATION Left 11/01/2016   Procedure: LEFT BREAST RADIOACTIVE SEED GUIDED LUMPECTOMY;  Surgeon: Griselda Miner, MD;  Location: [facility];  Service: General;  Laterality: Left;   BUNIONECTOMY     right foot   DENTAL SURGERY     EXCISION PERITONSILLAR CYST  07/05/2022   Procedure: EXCISION PALATABLE TORES;  Surgeon: Ocie Doyne, DMD;  Location: [facility];  Service: Oral Surgery;;   ROBOT ASSISTED MYOMECTOMY  03/03/2011   Procedure: ROBOTIC ASSISTED MYOMECTOMY;  Surgeon: Esmeralda Arthur, MD;  Location: [facility];  Service: Gynecology;  Laterality: N/A;   TOOTH EXTRACTION N/A 07/05/2022   Procedure: DENTAL RESTORATION/EXTRACTIONS;  Surgeon: Ocie Doyne, DMD;  Location: [facility];  Service: Oral Surgery;  Laterality: N/A;   TUBAL LIGATION      OB History     Gravida   3   Para  2   Term  2   Preterm      AB  1   Living  2      SAB  1   IAB      Ectopic      Multiple      Live Births  2            Home Medications    Prior to Admission medications   Medication Sig Start Date End Date Taking? Authorizing Provider  montelukast (SINGULAIR) 10 MG tablet Take 1 tablet (10 mg total) by mouth at bedtime. 12/28/22  Yes Lliam Hoh, Lurena Joiner, PA-C  albuterol (VENTOLIN HFA) 108 (90 Base) MCG/ACT inhaler Inhale 1-2 puffs into the lungs every 6 (six) hours as needed for wheezing or shortness of breath. 12/06/22   Lamptey, Britta Mccreedy, MD  predniSONE (STERAPRED UNI-PAK 21 TAB) 10 MG (21) TBPK tablet Take by mouth daily. 6 tablets on day 1, 5 tablets on day 2, 4 tablets on day 3, 3 tablets on day 4, 2 tablets on day 5, 1 tablet on day 6 12/28/22   Nickisha Hum, PA-C  DULERA 100-5 MCG/ACT AERO Inhale 2 puffs into the lungs every 6 (six) hours as needed for wheezing.  08/14/15 06/27/19  [provider]  fluticasone (FLONASE) 50 MCG/ACT nasal spray  Place 2 sprays into both nostrils daily. 05/04/19 06/27/19  Rosezella Rumpf, PA-C    Family History Family History  Problem Relation Age of Onset   Diabetes Mother    Hypertension Mother    Thyroid disease Father    Heart disease Father    Hyperlipidemia Father    Cancer Maternal Grandmother     Social History Social History   Tobacco Use   Smoking status: Never   Smokeless tobacco: Never  Vaping Use   Vaping status: Never Used  Substance Use Topics   Alcohol use: No   Drug use: No     Allergies   Patient has no known allergies.   Review of Systems Review of Systems  Respiratory:  Positive for cough.    Per HPI  Physical Exam Triage Vital Signs ED Triage Vitals  Encounter Vitals Group     BP 12/28/22 0958 137/82     Systolic BP Percentile --      Diastolic BP Percentile --      Pulse Rate 12/28/22 0958 97     Resp 12/28/22 0958 17     Temp 12/28/22 0958 98.2 F (36.8 C)      Temp Source 12/28/22 0958 Oral     SpO2 12/28/22 0958 98 %     Weight --      Height --      Head Circumference --      Peak Flow --      Pain Score 12/28/22 1001 8     Pain Loc --      Pain Education --      Exclude from Growth Chart --    No data found.  Updated Vital Signs BP 137/82   Pulse 97   Temp 98.2 F (36.8 C) (Oral)   Resp 17   LMP 12/13/2022 (Approximate)   SpO2 98%    Physical Exam Vitals and nursing note reviewed.  Constitutional:      General: She is not in acute distress.    Appearance: She is not ill-appearing.  HENT:     Nose: No rhinorrhea.     Mouth/Throat:     Mouth: Mucous membranes are moist.     Pharynx: Oropharynx is clear. No posterior oropharyngeal erythema.  Eyes:     Conjunctiva/sclera: Conjunctivae normal.  Cardiovascular:     Rate and Rhythm: Normal rate and regular rhythm.     Pulses: Normal pulses.     Heart sounds: Normal heart sounds.  Pulmonary:     Effort: Pulmonary effort is normal. No respiratory distress.     Breath sounds: Wheezing and rales present.     Comments: Unlabored breathing. Low pitched wheezing and rales in the lower lobes. High pitched wheeze anterior chest Musculoskeletal:     Cervical back: Normal range of motion.  Lymphadenopathy:     Cervical: No cervical adenopathy.  Skin:    General: Skin is warm and dry.  Neurological:     Mental Status: She is alert and oriented to person, place, and time.     UC Treatments / Results  Labs (all labs ordered are listed, but only abnormal results are displayed) Labs Reviewed - No data to display  EKG  Radiology DG Chest 2 View  Result Date: 12/28/2022 CLINICAL DATA:  Cough and wheezing for 3 weeks.  Asthma. EXAM: CHEST - 2 VIEW COMPARISON:  10/22/2017 FINDINGS: The heart size and mediastinal contours are within normal limits. Both lungs are clear. The visualized skeletal  structures are unremarkable. IMPRESSION: No active cardiopulmonary disease.  Electronically Signed   By: Danae Orleans M.D.   On: 12/28/2022 10:43    Procedures Procedures  Medications Ordered in UC Medications  ipratropium-albuterol (DUONEB) 0.5-2.5 (3) MG/3ML nebulizer solution 3 mL (3 mLs Nebulization Given 12/28/22 1033)    Initial Impression / Assessment and Plan / UC Course  I have reviewed the triage vital signs and the nursing notes.  Pertinent labs & imaging results that were available during my care of the patient were reviewed by me and considered in my medical decision making (see chart for details).  Stable vitals; afebrile, sating 98% on room air Chest xray negative. Images independently reviewed by me, agree with radiology interpretation. DuoNeb treatment given  She was prescribed singular at 11/4 visit but never picked it up. Recommend nightly. Inhaler q6 hours for the next several days. With continued wheezing for 3 weeks after prednisone burst, will increase to 6 day taper.  Patient has been seen multiple times in urgent care for asthma. She does not have a primary care provider. Discussed importance of follow up visits and monitoring. Set up with PCP for 12/10 (2 weeks from today). Discussed ED precautions for any worsening.   Final Clinical Impressions(s) / UC Diagnoses   Final diagnoses:  Mild intermittent asthma with exacerbation  Acute cough     Discharge Instructions      Please use your inhaler 3 times daily (every 6 hours) for the next several days.  Prednisone taper as directed. You will take 6 pills on day 1, 5 pills on day 2, and continue decreasing by 1 pill each day until finished.   Please start taking the Singulair tablet every night before bed  Follow up with your new primary care provider (this is very important!)     ED Prescriptions     Medication Sig Dispense Auth. Provider   montelukast (SINGULAIR) 10 MG tablet Take 1 tablet (10 mg total) by mouth at bedtime. 30 tablet Tensley Wery, PA-C   predniSONE  (STERAPRED UNI-PAK 21 TAB) 10 MG (21) TBPK tablet  (Status: Discontinued) Take by mouth daily. 6 tablets on day 1 5 tablets on day 2 4 tablets on day 3 3 tablets on day 4 2 tablets on day 5 1 tablet on day 6 21 tablet Christiana Gurevich, PA-C   predniSONE (STERAPRED UNI-PAK 21 TAB) 10 MG (21) TBPK tablet Take by mouth daily. 6 tablets on day 1, 5 tablets on day 2, 4 tablets on day 3, 3 tablets on day 4, 2 tablets on day 5, 1 tablet on day 6 21 tablet Aniyia Rane, Lurena Joiner, PA-C      PDMP not reviewed this encounter.   Jazaria Jarecki, Lurena Joiner, PA-C 12/28/22 1059

## 2022-12-28 NOTE — ED Triage Notes (Signed)
Pt c/o cough, wheezing for a few weeks. States she has asthma  She was seen on 12/06/22 for same symptoms

## 2023-01-11 ENCOUNTER — Ambulatory Visit: Payer: Medicaid Other | Admitting: Family Medicine

## 2023-01-29 ENCOUNTER — Ambulatory Visit (INDEPENDENT_AMBULATORY_CARE_PROVIDER_SITE_OTHER): Payer: Medicaid Other

## 2023-01-29 ENCOUNTER — Encounter (HOSPITAL_COMMUNITY): Payer: Self-pay

## 2023-01-29 ENCOUNTER — Ambulatory Visit (HOSPITAL_COMMUNITY)
Admission: EM | Admit: 2023-01-29 | Discharge: 2023-01-29 | Disposition: A | Discharge status: Home / Self Care | Payer: Medicaid Other | Attending: Internal Medicine | Admitting: Internal Medicine

## 2023-01-29 DIAGNOSIS — R052 Subacute cough: Secondary | ICD-10-CM | POA: Diagnosis not present

## 2023-01-29 DIAGNOSIS — J4521 Mild intermittent asthma with (acute) exacerbation: Secondary | ICD-10-CM

## 2023-01-29 MED ORDER — IPRATROPIUM-ALBUTEROL 0.5-2.5 (3) MG/3ML IN SOLN
3.0000 mL | Freq: Once | RESPIRATORY_TRACT | Status: AC
  Administered 2023-01-29: 3 mL via RESPIRATORY_TRACT

## 2023-01-29 MED ORDER — ALBUTEROL SULFATE HFA 108 (90 BASE) MCG/ACT IN AERS: 1.0000 | INHALATION_SPRAY | Freq: Four times a day (QID) | RESPIRATORY_TRACT | 0 refills | Status: DC | PRN

## 2023-01-29 MED ORDER — PREDNISONE 10 MG PO TABS
40.0000 mg | ORAL_TABLET | Freq: Every day | ORAL | 0 refills | Status: AC
Start: 2023-01-29 — End: 2023-02-03

## 2023-01-29 MED ORDER — DEXAMETHASONE SODIUM PHOSPHATE 10 MG/ML IJ SOLN
INTRAMUSCULAR | Status: AC
  Filled 2023-01-29: qty 1

## 2023-01-29 MED ORDER — IPRATROPIUM-ALBUTEROL 0.5-2.5 (3) MG/3ML IN SOLN
RESPIRATORY_TRACT | Status: AC
  Filled 2023-01-29: qty 3

## 2023-01-29 MED ORDER — DEXAMETHASONE SODIUM PHOSPHATE 10 MG/ML IJ SOLN
10.0000 mg | Freq: Once | INTRAMUSCULAR | Status: AC
  Administered 2023-01-29: 10 mg via INTRAMUSCULAR

## 2023-01-29 MED ORDER — MONTELUKAST SODIUM 10 MG PO TABS: 10.0000 mg | ORAL_TABLET | Freq: Every day | ORAL | 1 refills | Status: DC

## 2023-01-29 MED ORDER — BENZONATATE 100 MG PO CAPS: 100.0000 mg | ORAL_CAPSULE | Freq: Three times a day (TID) | ORAL | 0 refills | Status: DC | PRN

## 2023-01-29 NOTE — ED Provider Notes (Signed)
[facility]    CSN: [id number] Arrival date & time: 01/29/23  1451      History   Chief Complaint Chief Complaint  Patient presents with   Cough   Wheezing    HPI Julia Shaw is a 47 y.o. female.   47 year old female who presents to urgent care with complaints of cough and wheezing that has been going on for 4 days.  When discussing with the patient the symptoms have actually been going on much longer than this as she has been evaluated here on November 4 and November 26 for the same symptoms.  At those appointments she was treated with nebulizing treatments and given steroids and albuterol inhalers.  She reports that she took everything as directed but continues to have symptoms.  She was supposed to follow-up with a primary care physician but has not.  She reports that she has a primary care physician that she can call.  She denies fevers, chills, chest pain, nausea, vomiting, abdominal pain, congestion, headache.     Cough Associated symptoms: shortness of breath and wheezing   Associated symptoms: no chest pain, no chills, no ear pain, no fever, no rash and no sore throat   Wheezing Associated symptoms: cough and shortness of breath   Associated symptoms: no chest pain, no ear pain, no fever, no rash and no sore throat     Past Medical History:  Diagnosis Date   Allergy    Anemia    Arthritis    Asthma    Breast lesion    Left   Bronchitis    Fibroid    Hx of trichomoniasis    Papilloma of breast    left    Patient Active Problem List   Diagnosis Date Noted   Asthma 08/30/2015   Allergy 08/30/2015   Uterine leiomyoma 03/03/2011    Past Surgical History:  Procedure Laterality Date   ALVEOLOPLASTY  07/05/2022   Procedure: ALVEOLOPLASTY;  Surgeon: Ocie Doyne, DMD;  Location: [facility];  Service: Oral Surgery;;   BREAST LUMPECTOMY WITH RADIOACTIVE SEED LOCALIZATION Left 11/01/2016   Procedure: LEFT BREAST RADIOACTIVE SEED GUIDED LUMPECTOMY;   Surgeon: Griselda Miner, MD;  Location: [facility];  Service: General;  Laterality: Left;   BUNIONECTOMY     right foot   DENTAL SURGERY     EXCISION PERITONSILLAR CYST  07/05/2022   Procedure: EXCISION PALATABLE TORES;  Surgeon: Ocie Doyne, DMD;  Location: [facility];  Service: Oral Surgery;;   ROBOT ASSISTED MYOMECTOMY  03/03/2011   Procedure: ROBOTIC ASSISTED MYOMECTOMY;  Surgeon: Esmeralda Arthur, MD;  Location: [facility];  Service: Gynecology;  Laterality: N/A;   TOOTH EXTRACTION N/A 07/05/2022   Procedure: DENTAL RESTORATION/EXTRACTIONS;  Surgeon: Ocie Doyne, DMD;  Location: [facility];  Service: Oral Surgery;  Laterality: N/A;   TUBAL LIGATION      OB History     Gravida  3   Para  2   Term  2   Preterm      AB  1   Living  2      SAB  1   IAB      Ectopic      Multiple      Live Births  2            Home Medications    Prior to Admission medications   Medication Sig Start Date End Date Taking? Authorizing Provider  albuterol (VENTOLIN HFA) 108 (90 Base) MCG/ACT inhaler Inhale  1-2 puffs into the lungs every 6 (six) hours as needed for wheezing or shortness of breath. 12/06/22   Lamptey, Britta Mccreedy, MD  montelukast (SINGULAIR) 10 MG tablet Take 1 tablet (10 mg total) by mouth at bedtime. 12/28/22   Rising, Lurena Joiner, PA-C  predniSONE (STERAPRED UNI-PAK 21 TAB) 10 MG (21) TBPK tablet Take by mouth daily. 6 tablets on day 1, 5 tablets on day 2, 4 tablets on day 3, 3 tablets on day 4, 2 tablets on day 5, 1 tablet on day 6 12/28/22   Rising, Rebecca, PA-C  DULERA 100-5 MCG/ACT AERO Inhale 2 puffs into the lungs every 6 (six) hours as needed for wheezing.  08/14/15 06/27/19  [provider]  fluticasone (FLONASE) 50 MCG/ACT nasal spray Place 2 sprays into both nostrils daily. 05/04/19 06/27/19  Rosezella Rumpf, PA-C    Family History Family History  Problem Relation Age of Onset   Diabetes Mother    Hypertension Mother    Thyroid disease Father    Heart disease Father     Hyperlipidemia Father    Cancer Maternal Grandmother     Social History Social History   Tobacco Use   Smoking status: Never   Smokeless tobacco: Never  Vaping Use   Vaping status: Never Used  Substance Use Topics   Alcohol use: No   Drug use: No     Allergies   Patient has no known allergies.   Review of Systems Review of Systems  Constitutional:  Negative for chills and fever.  HENT:  Negative for ear pain and sore throat.   Eyes:  Negative for pain and visual disturbance.  Respiratory:  Positive for cough, shortness of breath and wheezing.   Cardiovascular:  Negative for chest pain and palpitations.  Gastrointestinal:  Negative for abdominal pain and vomiting.  Genitourinary:  Negative for dysuria and hematuria.  Musculoskeletal:  Negative for arthralgias and back pain.  Skin:  Negative for color change and rash.  Neurological:  Negative for seizures and syncope.  All other systems reviewed and are negative.    Physical Exam Triage Vital Signs ED Triage Vitals [01/29/23 1650]  Encounter Vitals Group     BP (!) 159/131     Systolic BP Percentile      Diastolic BP Percentile      Pulse Rate 81     Resp 18     Temp 98.5 F (36.9 C)     Temp Source Oral     SpO2 95 %     Weight      Height      Head Circumference      Peak Flow      Pain Score      Pain Loc      Pain Education      Exclude from Growth Chart    No data found.  Updated Vital Signs BP (!) 159/131 (BP Location: Left Arm)   Pulse 81   Temp 98.5 F (36.9 C) (Oral)   Resp 18   SpO2 95%   Visual Acuity Right Eye Distance:   Left Eye Distance:   Bilateral Distance:    Right Eye Near:   Left Eye Near:    Bilateral Near:     Physical Exam Vitals and nursing note reviewed.  Constitutional:      General: She is not in acute distress.    Appearance: She is well-developed.  HENT:     Head: Normocephalic and atraumatic.  Eyes:  Conjunctiva/sclera: Conjunctivae normal.   Cardiovascular:     Rate and Rhythm: Normal rate and regular rhythm.     Heart sounds: No murmur heard. Pulmonary:     Effort: Pulmonary effort is normal. No respiratory distress.     Breath sounds: Examination of the right-upper field reveals decreased breath sounds and wheezing. Examination of the left-upper field reveals decreased breath sounds and wheezing. Examination of the right-middle field reveals decreased breath sounds and rhonchi. Examination of the left-middle field reveals decreased breath sounds and rhonchi. Examination of the right-lower field reveals decreased breath sounds and rhonchi. Examination of the left-lower field reveals decreased breath sounds and rhonchi. Decreased breath sounds, wheezing and rhonchi present.  Abdominal:     Palpations: Abdomen is soft.     Tenderness: There is no abdominal tenderness.  Musculoskeletal:        General: No swelling.     Cervical back: Neck supple.  Skin:    General: Skin is warm and dry.     Capillary Refill: Capillary refill takes less than 2 seconds.  Neurological:     Mental Status: She is alert.  Psychiatric:        Mood and Affect: Mood normal.      UC Treatments / Results  Labs (all labs ordered are listed, but only abnormal results are displayed) Labs Reviewed - No data to display  EKG   Radiology No results found.  Procedures Procedures (including critical care time)  Medications Ordered in UC Medications - No data to display  Initial Impression / Assessment and Plan / UC Course  I have reviewed the triage vital signs and the nursing notes.  Pertinent labs & imaging results that were available during my care of the patient were reviewed by me and considered in my medical decision making (see chart for details).     Subacute cough - Plan: DG Chest 2 View, DG Chest 2 View  Mild intermittent asthma with exacerbation   Symptoms most consistent with acute asthma exacerbation.  Chest x-ray done today  and when compared to x-ray done on November 26, there does not appear to be any significant changes.  Will treat with the following: Duo nebulizing treatment done today in the office to help with wheezing and shortness of breath Decadron (a steroid) injection given today to help with inflammation Prednisone 40 mg daily for 5 days. Take this in the morning.  Benzonatate 100 mg every 8 hours as needed for cough. Albuterol inhaler 1 to 2 puffs every 6 hours as needed for wheezing or shortness of breath Schedule an appointment with your primary care physician as soon as possible to discuss worsening asthma Return to urgent care or PCP if symptoms worsen or fail to resolve.    Blood pressure was addressed during visit and recommended patient monitor this at home. The patient should follow up with PCP for ongoing management. Discussed alarm symptoms that would warrant emergent evaluation to which patient expressed understanding.    Final Clinical Impressions(s) / UC Diagnoses   Final diagnoses:  None   Discharge Instructions   None    ED Prescriptions   None    PDMP not reviewed this encounter.   Landis Martins, [place] 01/29/23 1801

## 2023-01-29 NOTE — Discharge Instructions (Addendum)
Symptoms most consistent with acute asthma exacerbation.  Chest x-ray done today and when compared to x-ray done on November 26, there does not appear to be any significant changes.  Will treat with the following: Duo nebulizing treatment done today in the office to help with wheezing and shortness of breath Decadron (a steroid) injection given today to help with inflammation Prednisone 40 mg daily for 5 days. Take this in the morning. Start tomorrow morning 12/28 Benzonatate 100 mg every 8 hours as needed for cough. Albuterol inhaler 1 to 2 puffs every 6 hours as needed for wheezing or shortness of breath Schedule an appointment with your primary care physician as soon as possible to discuss worsening asthma Return to urgent care or PCP if symptoms worsen or fail to resolve.

## 2023-01-29 NOTE — ED Triage Notes (Signed)
Patient c/o cough and wheezing x 4 days.Patient has a history of asthma, however does not have an inhaler.

## 2023-02-17 ENCOUNTER — Emergency Department (HOSPITAL_COMMUNITY): Payer: Medicaid Other

## 2023-02-17 ENCOUNTER — Encounter (HOSPITAL_COMMUNITY): Payer: Self-pay

## 2023-02-17 ENCOUNTER — Emergency Department (HOSPITAL_COMMUNITY)
Admission: EM | Admit: 2023-02-17 | Discharge: 2023-02-17 | Disposition: A | Discharge status: Home / Self Care | Payer: Medicaid Other | Attending: Emergency Medicine | Admitting: Emergency Medicine

## 2023-02-17 ENCOUNTER — Other Ambulatory Visit: Payer: Self-pay

## 2023-02-17 DIAGNOSIS — J45909 Unspecified asthma, uncomplicated: Secondary | ICD-10-CM | POA: Diagnosis not present

## 2023-02-17 DIAGNOSIS — M542 Cervicalgia: Secondary | ICD-10-CM | POA: Insufficient documentation

## 2023-02-17 MED ORDER — NAPROXEN 500 MG PO TABS: 500.0000 mg | ORAL_TABLET | Freq: Two times a day (BID) | ORAL | 0 refills | Status: DC

## 2023-02-17 MED ORDER — METHOCARBAMOL 500 MG PO TABS: 500.0000 mg | ORAL_TABLET | Freq: Two times a day (BID) | ORAL | 0 refills | Status: DC

## 2023-02-17 NOTE — ED Provider Triage Note (Signed)
Emergency Medicine Provider Triage Evaluation Note  Julia Shaw , a 47 y.o. female  was evaluated in triage.  Pt complains of right sided neck pain ongoing for the past couple of days. No injuries or trauma. Pain worsens when she turns her neck. Denies any vision changes, headache, dizziness.   Review of Systems  Positive: As above Negative: As above  Physical Exam  BP 137/81 (BP Location: Left Arm)   Pulse 78   Temp 98.2 F (36.8 C) (Oral)   Resp 16   Wt 86.2 kg   SpO2 100%   BMI 37.11 kg/m  Gen:   Awake, no distress   Resp:  Normal effort  MSK:   Moves extremities without difficulty   Pupils equal round and reactive  TTP of the right trapezius  Medical Decision Making  Medically screening exam initiated at 4:05 PM.  Appropriate orders placed.  Julia Shaw was informed that the remainder of the evaluation will be completed by another provider, this initial triage assessment does not replace that evaluation, and the importance of remaining in the ED until their evaluation is complete.     Arabella Merles, PA-C 02/17/23 1605

## 2023-02-17 NOTE — ED Triage Notes (Signed)
C/o right sided neck pain radiating into head x2 days.  Denies lifting/pushing heavy object.  Denies cp/sob.

## 2023-02-17 NOTE — ED Provider Notes (Signed)
[place] EMERGENCY DEPARTMENT AT [facility] Provider Note   CSN: [id number] Arrival date & time: 02/17/23  1507     History No chief complaint on file.   Julia Shaw is a 48 y.o. female.  HPI   Patient presents the ED today with a 3 history of right sided neck pain that began when she woke up from sleep.  Says that she has a hard time looking left due to the right sided neck pain.  Does not know of any trauma, injury.  Denies fever, chills, swelling, headache, hearing loss, earache, sore throat, chest pain, shortness of breath, nausea, vomiting.    Home Medications Prior to Admission medications   Medication Sig Start Date End Date Taking? Authorizing Provider  methocarbamol (ROBAXIN) 500 MG tablet Take 1 tablet (500 mg total) by mouth 2 (two) times daily. 02/17/23  Yes Lunette Stands, PA-C  naproxen (NAPROSYN) 500 MG tablet Take 1 tablet (500 mg total) by mouth 2 (two) times daily. 02/17/23  Yes Lunette Stands, PA-C  albuterol (VENTOLIN HFA) 108 (90 Base) MCG/ACT inhaler Inhale 1-2 puffs into the lungs every 6 (six) hours as needed for wheezing or shortness of breath. 01/29/23   White, Elizabeth A, PA-C  benzonatate (TESSALON) 100 MG capsule Take 1 capsule (100 mg total) by mouth every 8 (eight) hours as needed for cough. 01/29/23   White, Elizabeth A, PA-C  montelukast (SINGULAIR) 10 MG tablet Take 1 tablet (10 mg total) by mouth at bedtime. 01/29/23   White, Elizabeth A, PA-C  DULERA 100-5 MCG/ACT AERO Inhale 2 puffs into the lungs every 6 (six) hours as needed for wheezing.  08/14/15 06/27/19  [provider]  fluticasone (FLONASE) 50 MCG/ACT nasal spray Place 2 sprays into both nostrils daily. 05/04/19 06/27/19  Rosezella Rumpf, PA-C      Allergies    Patient has no known allergies.    Review of Systems   Review of Systems  Musculoskeletal:  Positive for myalgias.  All other systems reviewed and are negative.   Physical Exam Updated Vital  Signs BP 127/79 (BP Location: Left Arm)   Pulse 95   Temp 98.2 F (36.8 C) (Oral)   Resp 18   Wt 86.2 kg   LMP 01/29/2023   SpO2 100%   BMI 37.11 kg/m  Physical Exam Vitals and nursing note reviewed.  Constitutional:      Appearance: Normal appearance.  HENT:     Head: Normocephalic and atraumatic.  Eyes:     Extraocular Movements: Extraocular movements intact.     Conjunctiva/sclera: Conjunctivae normal.  Cardiovascular:     Rate and Rhythm: Normal rate and regular rhythm.     Pulses: Normal pulses.     Heart sounds: Normal heart sounds. No murmur heard.    No friction rub. No gallop.  Pulmonary:     Effort: Pulmonary effort is normal. No respiratory distress.     Breath sounds: Normal breath sounds.  Abdominal:     General: Abdomen is flat.     Palpations: Abdomen is soft.     Tenderness: There is no abdominal tenderness.  Musculoskeletal:        General: Tenderness (Tenderness noted over the right trapezius muscle.) present. No swelling, deformity or signs of injury.     Comments: No erythema, swelling, increased warmth noted to the skin around the right trapezius  Skin:    General: Skin is warm and dry.  Neurological:     General:  No focal deficit present.     Mental Status: She is alert. Mental status is at baseline.  Psychiatric:        Mood and Affect: Mood normal.     ED Results / Procedures / Treatments   Labs (all labs ordered are listed, but only abnormal results are displayed) Labs Reviewed - No data to display  EKG None  Radiology DG Cervical Spine 2-3 Views Result Date: 02/17/2023 CLINICAL DATA:  Right-sided neck pain for the past few days. No injury. EXAM: CERVICAL SPINE - 2-3 VIEW COMPARISON:  Cervical spine x-rays dated August 30, 2015. FINDINGS: The lateral view is diagnostic to the C6-C7 level. There is no acute fracture or subluxation. Vertebral body heights are preserved. Alignment is normal. Interveterbral disc spaces are maintained. Normal  prevertebral soft tissues. IMPRESSION: Negative cervical spine radiographs. Electronically Signed   By: Obie Dredge M.D.   On: 02/17/2023 16:36    Procedures Procedures   Medications Ordered in ED Medications - No data to display  ED Course/ Medical Decision Making/ A&P    Medical Decision Making  This patient is a 48 year old who presents to the ED for concern of right-sided trapezius pain.   Differential diagnoses prior to evaluation: The emergent differential diagnosis includes, but is not limited to, muscular tear, muscle strain, cellulitis, dermatitis, referred pain. This is not an exhaustive differential.   Past Medical History / Co-morbidities / Social History: Asthma, anemia  Additional history: Chart reviewed. Pertinent results include: Has had many ER, urgent care visits Lab Tests/Imaging studies: I personally interpreted labs/imaging and the pertinent results include:   X ray neck was unremarkable I agree with the radiologist interpretation.    Medications: I ordered medication including Robaxin, naproxen.  I have reviewed the patients home medicines and have made adjustments as needed.    ED Course:   Patient is a 48 year old female presents the ED today complaining of a 3-day history of right-sided trapezius pain that started after she woke up.  Denies trauma, injury to the area.  Denies fever, nausea, vomiting, vertigo, hearing loss, abdominal pain, chest pain, shortness of breath, headache.  Physical exam was remarkable only for tenderness noted along the trapezius muscle with no erythema, swelling, increased warmth to the area.  No midline tenderness.  Able to ambulate without difficulty.  Low concern for any infectious etiology at this time or any other emergent pathologies.  Xray was also unremarkable.  Low concern for any referred pain or other etiology at this time.  I believe this patient is to discharge at this time.  Will prescribe Robaxin and  naproxen to help with pain.  Provided strict return to ER precautions.  Patient expressed agreement understanding of plan.     Disposition: After consideration of the diagnostic results and the patients response to treatment, I feel that patient benefit from treatment noted as above and discharge.   emergency department workup does not suggest an emergent condition requiring admission or immediate intervention beyond what has been performed at this time. The plan is: Robaxin, naproxen for pain relief, follow-up with PCP, turn for new or worsening symptoms. The patient is safe for discharge and has been instructed to return immediately for worsening symptoms, change in symptoms or any other concerns.   Final Clinical Impression(s) / ED Diagnoses Final diagnoses:  Neck pain    Rx / DC Orders ED Discharge Orders          Ordered    methocarbamol (ROBAXIN) 500 MG  tablet  2 times daily        02/17/23 1841    naproxen (NAPROSYN) 500 MG tablet  2 times daily        02/17/23 1841          Portions of this report may have been transcribed using voice recognition software. Every effort was made to ensure accuracy; however, inadvertent computerized transcription errors may be present.     Lavonia Drafts 02/17/23 1849    Benjiman Core, MD 02/17/23 [phone]

## 2023-02-17 NOTE — Discharge Instructions (Addendum)
You were seen today for neck pain.  I do not believe this is due to any emergent pathology.  X ray were also reassuring.  I am prescribing you Robaxin and naproxen.  These will help with inflammation to the area.  Follow-up with your PCP for continued pain.  Please take Robaxin, 500 mg up to twice a day as needed for muscle spasm, this is a muscle relaxer, it may cause generalized weakness, sleepiness and you should not drive or do important things while taking this medication. Please take Naprosyn, 500mg  by mouth twice daily as needed for pain - this in an antiinflammatory medicine (NSAID) and is similar to ibuprofen - many people feel that it is stronger than ibuprofen and it is easier to take since it is a smaller pill.  Please use this only for 1 week - if your pain persists, you will need to follow up with your doctor in the office for ongoing guidance and pain control.    Return for any new or worsening symptoms.  It was a pleasure seeing you in the ER today.

## 2023-03-04 ENCOUNTER — Ambulatory Visit (HOSPITAL_COMMUNITY)
Admission: EM | Admit: 2023-03-04 | Discharge: 2023-03-04 | Disposition: A | Discharge status: Home / Self Care | Payer: Medicaid Other | Attending: Emergency Medicine | Admitting: Emergency Medicine

## 2023-03-04 ENCOUNTER — Encounter (HOSPITAL_COMMUNITY): Payer: Self-pay

## 2023-03-04 DIAGNOSIS — J4521 Mild intermittent asthma with (acute) exacerbation: Secondary | ICD-10-CM

## 2023-03-04 MED ORDER — ALBUTEROL SULFATE HFA 108 (90 BASE) MCG/ACT IN AERS
1.0000 | INHALATION_SPRAY | Freq: Four times a day (QID) | RESPIRATORY_TRACT | 0 refills | Status: DC | PRN
Start: 2023-03-04 — End: 2023-04-19

## 2023-03-04 MED ORDER — IPRATROPIUM-ALBUTEROL 0.5-2.5 (3) MG/3ML IN SOLN
RESPIRATORY_TRACT | Status: AC
  Filled 2023-03-04: qty 3

## 2023-03-04 MED ORDER — PREDNISONE 20 MG PO TABS: 40.0000 mg | ORAL_TABLET | Freq: Every day | ORAL | 0 refills | Status: AC

## 2023-03-04 MED ORDER — IPRATROPIUM-ALBUTEROL 0.5-2.5 (3) MG/3ML IN SOLN
3.0000 mL | Freq: Once | RESPIRATORY_TRACT | Status: AC
  Administered 2023-03-04: 3 mL via RESPIRATORY_TRACT

## 2023-03-04 MED ORDER — DEXAMETHASONE SODIUM PHOSPHATE 10 MG/ML IJ SOLN
INTRAMUSCULAR | Status: AC
  Filled 2023-03-04: qty 1

## 2023-03-04 MED ORDER — DEXAMETHASONE SODIUM PHOSPHATE 10 MG/ML IJ SOLN
10.0000 mg | Freq: Once | INTRAMUSCULAR | Status: AC
  Administered 2023-03-04: 10 mg via INTRAMUSCULAR

## 2023-03-04 NOTE — Discharge Instructions (Signed)
We are treating you today for an asthma exacerbation.  Use the albuterol inhaler every 6 hours as needed.  Take the prednisone today and then daily with breakfast for the next 4 days.  Please follow-up with your primary care provider regarding ongoing management of your chronic disease, asthma.  Return to clinic for any new or urgent symptoms.

## 2023-03-04 NOTE — ED Triage Notes (Signed)
Patient here today with c/o cough, wheeze, and SOB X 4 days. Patient has h/o asthma and is out albuterol. Patient denies other symptoms. No known sick contacts.

## 2023-03-04 NOTE — ED Provider Notes (Signed)
[facility]    CSN: [id number] Arrival date & time: 03/04/23  0848      History   Chief Complaint Chief Complaint  Patient presents with   Cough    HPI Julia Shaw is a 48 y.o. female.   Patient presents to clinic complaining of cough, wheezing, shortness of breath and asthma exacerbation that started on Monday.  She ran out of her albuterol inhaler on Monday as well.  Reports her cough and wheezing get really bad at night and it interfered with her sleep.  Does have a history of asthma.  Is not having any sore throat, congestion, fever or bodyaches.  No recent sick contacts.  The history is provided by medical records and the patient.  Cough   Past Medical History:  Diagnosis Date   Allergy    Anemia    Arthritis    Asthma    Breast lesion    Left   Bronchitis    Fibroid    Hx of trichomoniasis    Papilloma of breast    left    Patient Active Problem List   Diagnosis Date Noted   Asthma 08/30/2015   Allergy 08/30/2015   Uterine leiomyoma 03/03/2011    Past Surgical History:  Procedure Laterality Date   ALVEOLOPLASTY  07/05/2022   Procedure: ALVEOLOPLASTY;  Surgeon: Ocie Doyne, DMD;  Location: [facility] OR;  Service: Oral Surgery;;   BREAST LUMPECTOMY WITH RADIOACTIVE SEED LOCALIZATION Left 11/01/2016   Procedure: LEFT BREAST RADIOACTIVE SEED GUIDED LUMPECTOMY;  Surgeon: Griselda Miner, MD;  Location: [facility] OR;  Service: General;  Laterality: Left;   BUNIONECTOMY     right foot   DENTAL SURGERY     EXCISION PERITONSILLAR CYST  07/05/2022   Procedure: EXCISION PALATABLE TORES;  Surgeon: Ocie Doyne, DMD;  Location: [facility] OR;  Service: Oral Surgery;;   ROBOT ASSISTED MYOMECTOMY  03/03/2011   Procedure: ROBOTIC ASSISTED MYOMECTOMY;  Surgeon: Esmeralda Arthur, MD;  Location: [facility];  Service: Gynecology;  Laterality: N/A;   TOOTH EXTRACTION N/A 07/05/2022   Procedure: DENTAL RESTORATION/EXTRACTIONS;  Surgeon: Ocie Doyne, DMD;  Location: [facility];   Service: Oral Surgery;  Laterality: N/A;   TUBAL LIGATION      OB History     Gravida  3   Para  2   Term  2   Preterm      AB  1   Living  2      SAB  1   IAB      Ectopic      Multiple      Live Births  2            Home Medications    Prior to Admission medications   Medication Sig Start Date End Date Taking? Authorizing Provider  albuterol (VENTOLIN HFA) 108 (90 Base) MCG/ACT inhaler Inhale 1-2 puffs into the lungs every 6 (six) hours as needed for wheezing or shortness of breath. 03/04/23  Yes Rinaldo Ratel, [place] N, FNP  methocarbamol (ROBAXIN) 500 MG tablet Take 1 tablet (500 mg total) by mouth 2 (two) times daily. 02/17/23   Lunette Stands, PA-C  montelukast (SINGULAIR) 10 MG tablet Take 1 tablet (10 mg total) by mouth at bedtime. 01/29/23   White, Elizabeth A, PA-C  naproxen (NAPROSYN) 500 MG tablet Take 1 tablet (500 mg total) by mouth 2 (two) times daily. 02/17/23   Lunette Stands, PA-C  predniSONE (DELTASONE) 20 MG tablet Take 2 tablets (40  mg total) by mouth daily for 5 days. 03/04/23 03/09/23 Yes Rinaldo Ratel, [place] N, FNP  DULERA 100-5 MCG/ACT AERO Inhale 2 puffs into the lungs every 6 (six) hours as needed for wheezing.  08/14/15 06/27/19  [provider]  fluticasone (FLONASE) 50 MCG/ACT nasal spray Place 2 sprays into both nostrils daily. 05/04/19 06/27/19  Rosezella Rumpf, PA-C    Family History Family History  Problem Relation Age of Onset   Diabetes Mother    Hypertension Mother    Thyroid disease Father    Heart disease Father    Hyperlipidemia Father    Cancer Maternal Grandmother     Social History Social History   Tobacco Use   Smoking status: Never   Smokeless tobacco: Never  Vaping Use   Vaping status: Never Used  Substance Use Topics   Alcohol use: No   Drug use: No     Allergies   Patient has no known allergies.   Review of Systems Review of Systems  Per HPI   Physical Exam Triage Vital Signs ED Triage  Vitals  Encounter Vitals Group     BP 03/04/23 1018 122/81     Systolic BP Percentile --      Diastolic BP Percentile --      Pulse Rate 03/04/23 1018 70     Resp 03/04/23 1018 16     Temp 03/04/23 1018 98.8 F (37.1 C)     Temp Source 03/04/23 1018 Oral     SpO2 03/04/23 1018 95 %     Weight 03/04/23 1017 198 lb (89.8 kg)     Height 03/04/23 1017 5' (1.524 m)     Head Circumference --      Peak Flow --      Pain Score 03/04/23 1020 0     Pain Loc --      Pain Education --      Exclude from Growth Chart --    No data found.  Updated Vital Signs BP 122/81 (BP Location: Right Arm)   Pulse 70   Temp 98.8 F (37.1 C) (Oral)   Resp 16   Ht 5' (1.524 m)   Wt 198 lb (89.8 kg)   LMP 03/03/2023 (Exact Date)   SpO2 95%   BMI 38.67 kg/m   Visual Acuity Right Eye Distance:   Left Eye Distance:   Bilateral Distance:    Right Eye Near:   Left Eye Near:    Bilateral Near:     Physical Exam Vitals and nursing note reviewed.  Constitutional:      Appearance: Normal appearance.  HENT:     Head: Normocephalic and atraumatic.     Right Ear: External ear normal.     Left Ear: External ear normal.     Nose: Nose normal.     Mouth/Throat:     Mouth: Mucous membranes are moist.  Eyes:     Conjunctiva/sclera: Conjunctivae normal.  Cardiovascular:     Rate and Rhythm: Normal rate and regular rhythm.     Heart sounds: Normal heart sounds. No murmur heard. Pulmonary:     Breath sounds: Wheezing and rhonchi present.  Musculoskeletal:        General: Normal range of motion.     Cervical back: Normal range of motion.  Skin:    General: Skin is warm and dry.  Neurological:     General: No focal deficit present.     Mental Status: She is alert and oriented to person,  place, and time.  Psychiatric:        Mood and Affect: Mood normal.        Behavior: Behavior normal.      UC Treatments / Results  Labs (all labs ordered are listed, but only abnormal results are  displayed) Labs Reviewed - No data to display  EKG   Radiology No results found.  Procedures Procedures (including critical care time)  Medications Ordered in UC Medications  dexamethasone (DECADRON) injection 10 mg (has no administration in time range)  ipratropium-albuterol (DUONEB) 0.5-2.5 (3) MG/3ML nebulizer solution 3 mL (3 mLs Nebulization Given 03/04/23 1049)    Initial Impression / Assessment and Plan / UC Course  I have reviewed the triage vital signs and the nursing notes.  Pertinent labs & imaging results that were available during my care of the patient were reviewed by me and considered in my medical decision making (see chart for details).  Vitals and triage reviewed, patient is hemodynamically stable.  Heart with regular rate and rhythm, lungs with diffuse wheezing and rhonchi.  DuoNeb given in clinic, improved air movement.  Will give IM Decadron, sent home on steroid burst and with albuterol inhaler.  Treating for asthma exacerbation.  Encouraged PCP follow-up.  Work note provided.  Plan of care, follow-up care return precautions given, no questions at this time.     Final Clinical Impressions(s) / UC Diagnoses   Final diagnoses:  Mild intermittent asthma with exacerbation     Discharge Instructions      We are treating you today for an asthma exacerbation.  Use the albuterol inhaler every 6 hours as needed.  Take the prednisone today and then daily with breakfast for the next 4 days.  Please follow-up with your primary care provider regarding ongoing management of your chronic disease, asthma.  Return to clinic for any new or urgent symptoms.     ED Prescriptions     Medication Sig Dispense Auth. Provider   albuterol (VENTOLIN HFA) 108 (90 Base) MCG/ACT inhaler Inhale 1-2 puffs into the lungs every 6 (six) hours as needed for wheezing or shortness of breath. 18 g Rinaldo Ratel, [place] N, [place]   predniSONE (DELTASONE) 20 MG tablet Take 2 tablets (40 mg  total) by mouth daily for 5 days. 10 tablet Zelia Yzaguirre, [place] N, FNP      PDMP not reviewed this encounter.   Johnn Krasowski, [place] N, [place] 03/04/23 [phone]

## 2023-04-19 ENCOUNTER — Other Ambulatory Visit: Payer: Self-pay

## 2023-04-19 ENCOUNTER — Encounter (HOSPITAL_COMMUNITY): Payer: Self-pay | Admitting: *Deleted

## 2023-04-19 ENCOUNTER — Ambulatory Visit (HOSPITAL_COMMUNITY): Admission: EM | Admit: 2023-04-19 | Discharge: 2023-04-19 | Disposition: A | Discharge status: Home / Self Care

## 2023-04-19 DIAGNOSIS — J40 Bronchitis, not specified as acute or chronic: Secondary | ICD-10-CM | POA: Diagnosis not present

## 2023-04-19 MED ORDER — BENZONATATE 200 MG PO CAPS
200.0000 mg | ORAL_CAPSULE | Freq: Three times a day (TID) | ORAL | 0 refills | Status: DC | PRN
Start: 2023-04-19 — End: 2023-09-11

## 2023-04-19 MED ORDER — IPRATROPIUM-ALBUTEROL 0.5-2.5 (3) MG/3ML IN SOLN
3.0000 mL | Freq: Once | RESPIRATORY_TRACT | Status: AC
  Administered 2023-04-19: 3 mL via RESPIRATORY_TRACT

## 2023-04-19 MED ORDER — IPRATROPIUM-ALBUTEROL 0.5-2.5 (3) MG/3ML IN SOLN
RESPIRATORY_TRACT | Status: AC
  Filled 2023-04-19: qty 3

## 2023-04-19 MED ORDER — IPRATROPIUM-ALBUTEROL 0.5-2.5 (3) MG/3ML IN SOLN: 3.0000 mL | RESPIRATORY_TRACT | 0 refills | Status: DC | PRN

## 2023-04-19 MED ORDER — ALBUTEROL SULFATE HFA 108 (90 BASE) MCG/ACT IN AERS
1.0000 | INHALATION_SPRAY | Freq: Four times a day (QID) | RESPIRATORY_TRACT | 3 refills | Status: AC | PRN
Start: 2023-04-19 — End: ?

## 2023-04-19 MED ORDER — PREDNISONE 20 MG PO TABS
40.0000 mg | ORAL_TABLET | Freq: Every day | ORAL | 0 refills | Status: AC
Start: 2023-04-19 — End: 2023-04-24

## 2023-04-19 NOTE — ED Notes (Signed)
 Pt given a nebulizer machine for home use and form completed for neb doctors.

## 2023-04-19 NOTE — ED Provider Notes (Signed)
 [facility] [place]  Note:  This document was prepared using Dragon voice recognition software and may include unintentional dictation errors.  MRN: [id number] DOB: Oct 30, 1975  Subjective:   Julia Shaw is a 48 y.o. female presenting for ongoing wheezing, cough, shortness of breath x 3 days.  Patient reports history of asthma/bronchitis.  Denies fever, chest pain, weakness, dizziness.  Patient states that she normally has an albuterol inhaler but has been out for a few days.  Patient has not been taking any other medications over-the-counter to help symptoms.  Patient denies any known sick contacts.  No current facility-administered medications for this encounter.  Current Outpatient Medications:    benzonatate (TESSALON) 200 MG capsule, Take 1 capsule (200 mg total) by mouth 3 (three) times daily as needed for cough., Disp: 20 capsule, Rfl: 0   ipratropium-albuterol (DUONEB) 0.5-2.5 (3) MG/3ML SOLN, Take 3 mLs by nebulization every 4 (four) hours as needed., Disp: 360 mL, Rfl: 0   predniSONE (DELTASONE) 20 MG tablet, Take 2 tablets (40 mg total) by mouth daily for 5 days., Disp: 10 tablet, Rfl: 0   albuterol (VENTOLIN HFA) 108 (90 Base) MCG/ACT inhaler, Inhale 1-2 puffs into the lungs every 6 (six) hours as needed for wheezing or shortness of breath., Disp: 18 g, Rfl: 3   methocarbamol (ROBAXIN) 500 MG tablet, Take 1 tablet (500 mg total) by mouth 2 (two) times daily., Disp: 20 tablet, Rfl: 0   montelukast (SINGULAIR) 10 MG tablet, Take 1 tablet (10 mg total) by mouth at bedtime., Disp: 30 tablet, Rfl: 1   naproxen (NAPROSYN) 500 MG tablet, Take 1 tablet (500 mg total) by mouth 2 (two) times daily., Disp: 30 tablet, Rfl: 0   No Known Allergies  Past Medical History:  Diagnosis Date   Allergy    Anemia    Arthritis    Asthma    Breast lesion    Left   Bronchitis    Fibroid    Hx of trichomoniasis    Papilloma of breast    left     Past Surgical History:   Procedure Laterality Date   ALVEOLOPLASTY  07/05/2022   Procedure: ALVEOLOPLASTY;  Surgeon: Ocie Doyne, DMD;  [facility];  Service: Oral Surgery;;   BREAST LUMPECTOMY WITH RADIOACTIVE SEED LOCALIZATION Left 11/01/2016   Procedure: LEFT BREAST RADIOACTIVE SEED GUIDED LUMPECTOMY;  Surgeon: Griselda Miner, MD;  Location[facility];  Service: General;  Laterality: Left;   BUNIONECTOMY     right foot   DENTAL SURGERY     EXCISION PERITONSILLAR CYST  07/05/2022   Procedure: EXCISION PALATABLE TORES;  Surgeon: Ocie Doyne, DMD;  [facility];  Service: Oral Surgery;;   ROBOT ASSISTED MYOMECTOMY  03/03/2011   Procedure: ROBOTIC ASSISTED MYOMECTOMY;  Surgeon: Esmeralda Arthur, MD;  Location: [facility];  Service: Gynecology;  Laterality: N/A;   TOOTH EXTRACTION N/A 07/05/2022   Procedure: DENTAL RESTORATION/EXTRACTIONS;  Surgeon: Ocie Doyne, DMD;  Location: [facility];  Service: Oral Surgery;  Laterality: N/A;   TUBAL LIGATION      Family History  Problem Relation Age of Onset   Diabetes Mother    Hypertension Mother    Thyroid disease Father    Heart disease Father    Hyperlipidemia Father    Cancer Maternal Grandmother     Social History   Tobacco Use   Smoking status: Never   Smokeless tobacco: Never  Vaping Use   Vaping status: Never Used  Substance Use Topics  Alcohol use: No   Drug use: No    ROS Refer to HPI for ROS details.  Objective:   Vitals: BP 120/88   Pulse 86   Temp 98.4 F (36.9 C) (Oral)   Resp 16   LMP 03/31/2023 (Exact Date)   SpO2 96%   Physical Exam Vitals and nursing note reviewed.  Constitutional:      General: She is not in acute distress.    Appearance: Normal appearance. She is well-developed. She is not ill-appearing or toxic-appearing.  HENT:     Head: Normocephalic.  Cardiovascular:     Rate and Rhythm: Normal rate and regular rhythm.     Heart sounds: No murmur heard. Pulmonary:     Effort: Pulmonary effort is normal. No respiratory  distress.     Breath sounds: No stridor. Wheezing present. No rhonchi or rales.  Skin:    General: Skin is warm and dry.  Neurological:     General: No focal deficit present.     Mental Status: She is alert and oriented to person, place, and time.  Psychiatric:        Mood and Affect: Mood normal.     Procedures  No results found for this or any previous visit (from the past 24 hours).  Assessment and Plan :   PDMP not reviewed this encounter.  1. Bronchitis    1. Bronchitis (Primary) - ipratropium-albuterol (DUONEB) 0.5-2.5 (3) MG/3ML nebulizer solution 3 mL provided in UC for wheezing.  Follow-up evaluation reveals improved symptoms, less wheezing. - albuterol (VENTOLIN HFA) 108 (90 Base) MCG/ACT inhaler; Inhale 1-2 puffs into the lungs every 6 (six) hours as needed for wheezing or shortness of breath.  - predniSONE (DELTASONE) 20 MG tablet; Take 2 tablets (40 mg total) by mouth daily for 5 days.   - ipratropium-albuterol (DUONEB) 0.5-2.5 (3) MG/3ML SOLN; Take 3 mLs by nebulization every 4 (four) hours as needed.   - For home use only DME Nebulizer machine provided in UC for home nebulizer treatments to prevent exacerbation of chronic bronchitis. -Advised to continue monitoring symptoms if there is any change in severity such as increased shortness of breath, chest pain, weakness, dizziness, severe fever, severe cough.  Follow-up for further evaluation and management in the emergency department.  Lucky Cowboy   [place], [place], [place] 04/19/23 [phone]

## 2023-04-19 NOTE — Discharge Instructions (Addendum)
 1. Bronchitis (Primary) - ipratropium-albuterol (DUONEB) 0.5-2.5 (3) MG/3ML nebulizer solution 3 mL provided in UC for wheezing.  Follow-up evaluation reveals improved symptoms, less wheezing. - albuterol (VENTOLIN HFA) 108 (90 Base) MCG/ACT inhaler; Inhale 1-2 puffs into the lungs every 6 (six) hours as needed for wheezing or shortness of breath.  - predniSONE (DELTASONE) 20 MG tablet; Take 2 tablets (40 mg total) by mouth daily for 5 days.   - ipratropium-albuterol (DUONEB) 0.5-2.5 (3) MG/3ML SOLN; Take 3 mLs by nebulization every 4 (four) hours as needed.   - For home use only DME Nebulizer machine provided in UC for home nebulizer treatments to prevent exacerbation of chronic bronchitis. -Advised to continue monitoring symptoms if there is any change in severity such as increased shortness of breath, chest pain, weakness, dizziness, severe fever, severe cough.  Follow-up for further evaluation and management in the emergency department.

## 2023-04-19 NOTE — ED Triage Notes (Signed)
 C/O wheezing, cough, dypnea x 3 days. Denies fevers. States is out of her inhalers. Has not been taking any meds to help sxs.

## 2023-05-23 ENCOUNTER — Encounter (HOSPITAL_COMMUNITY): Payer: Self-pay | Admitting: Emergency Medicine

## 2023-05-23 ENCOUNTER — Ambulatory Visit (HOSPITAL_COMMUNITY)
Admission: EM | Admit: 2023-05-23 | Discharge: 2023-05-23 | Disposition: A | Discharge status: Home / Self Care | Attending: Emergency Medicine | Admitting: Emergency Medicine

## 2023-05-23 DIAGNOSIS — J4521 Mild intermittent asthma with (acute) exacerbation: Secondary | ICD-10-CM

## 2023-05-23 MED ORDER — METHYLPREDNISOLONE SODIUM SUCC 125 MG IJ SOLR
INTRAMUSCULAR | Status: AC
  Filled 2023-05-23: qty 2

## 2023-05-23 MED ORDER — IPRATROPIUM-ALBUTEROL 0.5-2.5 (3) MG/3ML IN SOLN
RESPIRATORY_TRACT | Status: AC
  Filled 2023-05-23: qty 3

## 2023-05-23 MED ORDER — METHYLPREDNISOLONE SODIUM SUCC 125 MG IJ SOLR
80.0000 mg | Freq: Once | INTRAMUSCULAR | Status: AC
  Administered 2023-05-23: 80 mg via INTRAMUSCULAR

## 2023-05-23 MED ORDER — ALBUTEROL SULFATE HFA 108 (90 BASE) MCG/ACT IN AERS
2.0000 | INHALATION_SPRAY | Freq: Once | RESPIRATORY_TRACT | Status: AC
  Administered 2023-05-23: 2 via RESPIRATORY_TRACT

## 2023-05-23 MED ORDER — ALBUTEROL SULFATE HFA 108 (90 BASE) MCG/ACT IN AERS
INHALATION_SPRAY | RESPIRATORY_TRACT | Status: AC
Start: 2023-05-23 — End: ?
  Filled 2023-05-23: qty 6.7

## 2023-05-23 MED ORDER — CETIRIZINE HCL 10 MG PO TABS: 10.0000 mg | ORAL_TABLET | Freq: Every day | ORAL | 0 refills | Status: DC

## 2023-05-23 MED ORDER — IPRATROPIUM-ALBUTEROL 0.5-2.5 (3) MG/3ML IN SOLN
3.0000 mL | Freq: Once | RESPIRATORY_TRACT | Status: AC
  Administered 2023-05-23: 3 mL via RESPIRATORY_TRACT

## 2023-05-23 MED ORDER — PREDNISONE 20 MG PO TABS: 40.0000 mg | ORAL_TABLET | Freq: Every day | ORAL | 0 refills | Status: AC

## 2023-05-23 NOTE — ED Notes (Signed)
 Duoneb order verified with provider.

## 2023-05-23 NOTE — ED Triage Notes (Signed)
 Pt had cough, sneezing, SOB for 3 days. States that she is out of her inhaler.

## 2023-05-23 NOTE — ED Provider Notes (Signed)
 [facility]    CSN: [id number] Arrival date & time: 05/23/23  1456      History   Chief Complaint Chief Complaint  Patient presents with   Shortness of Breath   Cough    HPI Julia Shaw is a 48 y.o. female.   Patient presents with cough, sneezing, and shortness of breath x 3 days.  Denies chest pain, fever, body aches, chills, and significant congestion.  Patient has history of asthma and states that she is out of her albuterol  inhaler.  Patient denies ever seeing a pulmonologist for her asthma.  The history is provided by the patient and medical records.  Shortness of Breath Associated symptoms: cough   Cough Associated symptoms: shortness of breath     Past Medical History:  Diagnosis Date   Allergy    Anemia    Arthritis    Asthma    Breast lesion    Left   Bronchitis    Fibroid    Hx of trichomoniasis    Papilloma of breast    left    Patient Active Problem List   Diagnosis Date Noted   Asthma 08/30/2015   Allergy 08/30/2015   Uterine leiomyoma 03/03/2011    Past Surgical History:  Procedure Laterality Date   ALVEOLOPLASTY  07/05/2022   Procedure: ALVEOLOPLASTY;  Surgeon: Ascencion Lava, DMD;  Location: [facility];  Service: Oral Surgery;;   BREAST LUMPECTOMY WITH RADIOACTIVE SEED LOCALIZATION Left 11/01/2016   Procedure: LEFT BREAST RADIOACTIVE SEED GUIDED LUMPECTOMY;  Surgeon: Caralyn Chandler, MD;  Location: [facility];  Service: General;  Laterality: Left;   BUNIONECTOMY     right foot   DENTAL SURGERY     EXCISION PERITONSILLAR CYST  07/05/2022   Procedure: EXCISION PALATABLE TORES;  Surgeon: Ascencion Lava, DMD;  Location: [facility];  Service: Oral Surgery;;   ROBOT ASSISTED MYOMECTOMY  03/03/2011   Procedure: ROBOTIC ASSISTED MYOMECTOMY;  Surgeon: Mckinley Spells, MD;  Location: [facility];  Service: Gynecology;  Laterality: N/A;   TOOTH EXTRACTION N/A 07/05/2022   Procedure: DENTAL RESTORATION/EXTRACTIONS;  Surgeon: Ascencion Lava, DMD;  Location:  [facility];  Service: Oral Surgery;  Laterality: N/A;   TUBAL LIGATION      OB History     Gravida  3   Para  2   Term  2   Preterm      AB  1   Living  2      SAB  1   IAB      Ectopic      Multiple      Live Births  2            Home Medications    Prior to Admission medications   Medication Sig Start Date End Date Taking? Authorizing Provider  cetirizine  (ZYRTEC  ALLERGY) 10 MG tablet Take 1 tablet (10 mg total) by mouth daily. 05/23/23  Yes Rosevelt Constable, Hamda Klutts A, NP  predniSONE  (DELTASONE ) 20 MG tablet Take 2 tablets (40 mg total) by mouth daily for 5 days. 05/23/23 05/28/23 Yes Levora Reas A, NP  albuterol  (VENTOLIN  HFA) 108 (90 Base) MCG/ACT inhaler Inhale 1-2 puffs into the lungs every 6 (six) hours as needed for wheezing or shortness of breath. 04/19/23   Reddick, Johnathan B, NP  benzonatate  (TESSALON ) 200 MG capsule Take 1 capsule (200 mg total) by mouth 3 (three) times daily as needed for cough. 04/19/23   Reddick, Johnathan B, NP  ipratropium-albuterol  (DUONEB) 0.5-2.5 (3) MG/3ML SOLN  Take 3 mLs by nebulization every 4 (four) hours as needed. 04/19/23   Reddick, Johnathan B, NP  methocarbamol  (ROBAXIN ) 500 MG tablet Take 1 tablet (500 mg total) by mouth 2 (two) times daily. 02/17/23   Bauer, Collin S, PA-C  montelukast  (SINGULAIR ) 10 MG tablet Take 1 tablet (10 mg total) by mouth at bedtime. 01/29/23   White, Elizabeth A, PA-C  naproxen  (NAPROSYN ) 500 MG tablet Take 1 tablet (500 mg total) by mouth 2 (two) times daily. 02/17/23   Bauer, Collin S, PA-C  DULERA 100-5 MCG/ACT AERO Inhale 2 puffs into the lungs every 6 (six) hours as needed for wheezing.  08/14/15 06/27/19  [provider]  fluticasone  (FLONASE ) 50 MCG/ACT nasal spray Place 2 sprays into both nostrils daily. 05/04/19 06/27/19  Everlyn Hockey, PA-C    Family History Family History  Problem Relation Age of Onset   Diabetes Mother    Hypertension Mother    Thyroid  disease Father    Heart  disease Father    Hyperlipidemia Father    Cancer Maternal Grandmother     Social History Social History   Tobacco Use   Smoking status: Never   Smokeless tobacco: Never  Vaping Use   Vaping status: Never Used  Substance Use Topics   Alcohol use: No   Drug use: No     Allergies   Patient has no known allergies.   Review of Systems Review of Systems  Respiratory:  Positive for cough and shortness of breath.    Per HPI  Physical Exam Triage Vital Signs ED Triage Vitals  Encounter Vitals Group     BP 05/23/23 1525 121/74     Systolic BP Percentile --      Diastolic BP Percentile --      Pulse Rate 05/23/23 1525 (!) 105     Resp 05/23/23 1525 (!) 22     Temp 05/23/23 1525 98.2 F (36.8 C)     Temp Source 05/23/23 1525 Oral     SpO2 05/23/23 1525 92 %     Weight --      Height --      Head Circumference --      Peak Flow --      Pain Score 05/23/23 1524 8     Pain Loc --      Pain Education --      Exclude from Growth Chart --    No data found.  Updated Vital Signs BP 121/74 (BP Location: Left Arm)   Pulse (!) 105   Temp 98.2 F (36.8 C) (Oral)   Resp (!) 22   LMP 05/20/2023   SpO2 98%   Visual Acuity Right Eye Distance:   Left Eye Distance:   Bilateral Distance:    Right Eye Near:   Left Eye Near:    Bilateral Near:     Physical Exam Vitals and nursing note reviewed.  Constitutional:      General: She is awake. She is not in acute distress.    Appearance: Normal appearance. She is well-developed and well-groomed. She is not ill-appearing.  HENT:     Right Ear: Tympanic membrane, ear canal and external ear normal.     Left Ear: Tympanic membrane, ear canal and external ear normal.     Nose: Rhinorrhea present.     Mouth/Throat:     Mouth: Mucous membranes are moist.     Pharynx: Oropharynx is clear.  Cardiovascular:     Rate and Rhythm:  Tachycardia present.  Pulmonary:     Effort: Pulmonary effort is normal.     Breath sounds: Wheezing  present.  Skin:    General: Skin is warm and dry.  Neurological:     Mental Status: She is alert.  Psychiatric:        Behavior: Behavior is cooperative.      UC Treatments / Results  Labs (all labs ordered are listed, but only abnormal results are displayed) Labs Reviewed - No data to display  EKG   Radiology No results found.  Procedures Procedures (including critical care time)  Medications Ordered in UC Medications  ipratropium-albuterol  (DUONEB) 0.5-2.5 (3) MG/3ML nebulizer solution 3 mL (3 mLs Nebulization Given 05/23/23 1603)  methylPREDNISolone  sodium succinate (SOLU-MEDROL ) 125 mg/2 mL injection 80 mg (80 mg Intramuscular Given 05/23/23 1630)  ipratropium-albuterol  (DUONEB) 0.5-2.5 (3) MG/3ML nebulizer solution 3 mL (3 mLs Nebulization Given 05/23/23 1633)  albuterol  (VENTOLIN  HFA) 108 (90 Base) MCG/ACT inhaler 2 puff (2 puffs Inhalation Given 05/23/23 1653)    Initial Impression / Assessment and Plan / UC Course  I have reviewed the triage vital signs and the nursing notes.  Pertinent labs & imaging results that were available during my care of the patient were reviewed by me and considered in my medical decision making (see chart for details).     Patient is well-appearing.  Mild tachycardia and tachypnea present.  SpO2 is 92%.  There is mild audible wheezing on exam as well as wheezing auscultated throughout all lung fields.  Given 2 DuoNebs and injection of Solu-Medrol  in clinic with significant relief of wheezing, tachypnea, and SpO2 increased to 98%.  Given albuterol  inhaler in clinic.  Prescribed short course of steroids for asthma exacerbation.  Prescribed cetirizine  to take daily.  Recommended following up with primary care provider where they can provide referral to pulmonology if needed.  Discussed return and strict ER precautions. Final Clinical Impressions(s) / UC Diagnoses   Final diagnoses:  Mild intermittent asthma with acute exacerbation      Discharge Instructions      Use albuterol  inhaler every 6 hours as needed for shortness of breath and wheezing. Start taking prednisone  2 tablets once daily starting tomorrow for 5 days. Take cetirizine  once daily to assist with sneezing and cough. Follow-up with your primary care provider regarding further management of your asthma with they can also provide you a referral to pulmonology if needed. Return here as needed. If you develop severe trouble breathing, chest pain, or passing out please seek immune medical treatment in the emergency department.    ED Prescriptions     Medication Sig Dispense Auth. Provider   cetirizine  (ZYRTEC  ALLERGY) 10 MG tablet Take 1 tablet (10 mg total) by mouth daily. 30 tablet Levora Reas A, NP   predniSONE  (DELTASONE ) 20 MG tablet Take 2 tablets (40 mg total) by mouth daily for 5 days. 10 tablet Levora Reas A, NP      PDMP not reviewed this encounter.   Levora Reas A, NP 05/23/23 [id number]

## 2023-05-23 NOTE — Discharge Instructions (Signed)
 Use albuterol  inhaler every 6 hours as needed for shortness of breath and wheezing. Start taking prednisone  2 tablets once daily starting tomorrow for 5 days. Take cetirizine  once daily to assist with sneezing and cough. Follow-up with your primary care provider regarding further management of your asthma with they can also provide you a referral to pulmonology if needed. Return here as needed. If you develop severe trouble breathing, chest pain, or passing out please seek immune medical treatment in the emergency department.

## 2023-09-11 ENCOUNTER — Encounter (HOSPITAL_COMMUNITY): Payer: Self-pay

## 2023-09-11 ENCOUNTER — Ambulatory Visit (INDEPENDENT_AMBULATORY_CARE_PROVIDER_SITE_OTHER)

## 2023-09-11 ENCOUNTER — Ambulatory Visit (HOSPITAL_COMMUNITY)
Admission: EM | Admit: 2023-09-11 | Discharge: 2023-09-11 | Disposition: A | Discharge status: Home / Self Care | Attending: Physician Assistant | Admitting: Physician Assistant

## 2023-09-11 DIAGNOSIS — J4541 Moderate persistent asthma with (acute) exacerbation: Secondary | ICD-10-CM

## 2023-09-11 DIAGNOSIS — R052 Subacute cough: Secondary | ICD-10-CM | POA: Diagnosis not present

## 2023-09-11 MED ORDER — METHYLPREDNISOLONE SODIUM SUCC 125 MG IJ SOLR
INTRAMUSCULAR | Status: AC
  Filled 2023-09-11: qty 2

## 2023-09-11 MED ORDER — IPRATROPIUM-ALBUTEROL 0.5-2.5 (3) MG/3ML IN SOLN
3.0000 mL | Freq: Once | RESPIRATORY_TRACT | Status: AC
Start: 2023-09-11 — End: 2023-09-11
  Administered 2023-09-11: 3 mL via RESPIRATORY_TRACT

## 2023-09-11 MED ORDER — IPRATROPIUM-ALBUTEROL 0.5-2.5 (3) MG/3ML IN SOLN
RESPIRATORY_TRACT | Status: AC
  Filled 2023-09-11: qty 3

## 2023-09-11 MED ORDER — METHYLPREDNISOLONE SODIUM SUCC 125 MG IJ SOLR
60.0000 mg | Freq: Once | INTRAMUSCULAR | Status: AC
  Administered 2023-09-11: 60 mg via INTRAMUSCULAR

## 2023-09-11 MED ORDER — PREDNISONE 10 MG (21) PO TBPK: ORAL_TABLET | ORAL | 0 refills | Status: DC

## 2023-09-11 MED ORDER — ALBUTEROL SULFATE HFA 108 (90 BASE) MCG/ACT IN AERS
INHALATION_SPRAY | RESPIRATORY_TRACT | Status: AC
  Filled 2023-09-11: qty 6.7

## 2023-09-11 MED ORDER — ALBUTEROL SULFATE HFA 108 (90 BASE) MCG/ACT IN AERS
2.0000 | INHALATION_SPRAY | Freq: Once | RESPIRATORY_TRACT | Status: AC
  Administered 2023-09-11: 2 via RESPIRATORY_TRACT

## 2023-09-11 MED ORDER — BUDESONIDE-FORMOTEROL FUMARATE 80-4.5 MCG/ACT IN AERO: 2.0000 | INHALATION_SPRAY | Freq: Two times a day (BID) | RESPIRATORY_TRACT | 1 refills | Status: AC

## 2023-09-11 NOTE — ED Provider Notes (Signed)
 [facility]    CSN: [id number] Arrival date & time: 09/11/23  1004      History   Chief Complaint Chief Complaint  Patient presents with   Cough    Cough, wheezing and sob    HPI Julia Shaw is a 48 y.o. female.   Patient presents today with a 2-week history of cough symptoms.  She reports cough is primarily dry and reports associated wheezing, shortness of breath.  She denies any fever, chest pain, nausea, vomiting, congestion, sore throat.  She does have a history of asthma and has been managing this with albuterol  alone.  She has previously seen an asthma specialist and was on maintenance medication but does not remember the name of this medicine and has not seen them recently.  She denies any recent antibiotics or steroids.  She has not been taking any over-the-counter medications for symptom management.  Denies hospitalization related to asthma in the past.  She denies any history of diabetes.  She is confident that she is not pregnant.  She is having difficulty with her daily activities as a result of the symptoms.    Past Medical History:  Diagnosis Date   Allergy    Anemia    Arthritis    Asthma    Breast lesion    Left   Bronchitis    Fibroid    Hx of trichomoniasis    Papilloma of breast    left    Patient Active Problem List   Diagnosis Date Noted   Asthma 08/30/2015   Allergy 08/30/2015   Uterine leiomyoma 03/03/2011    Past Surgical History:  Procedure Laterality Date   ALVEOLOPLASTY  07/05/2022   Procedure: ALVEOLOPLASTY;  Surgeon: Sheryle Hamilton, DMD;  Location: [facility];  Service: Oral Surgery;;   BREAST LUMPECTOMY WITH RADIOACTIVE SEED LOCALIZATION Left 11/01/2016   Procedure: LEFT BREAST RADIOACTIVE SEED GUIDED LUMPECTOMY;  Surgeon: Curvin Deward MOULD, MD;  Location: [facility];  Service: General;  Laterality: Left;   BUNIONECTOMY     right foot   DENTAL SURGERY     EXCISION PERITONSILLAR CYST  07/05/2022   Procedure: EXCISION PALATABLE  TORES;  Surgeon: Sheryle Hamilton, DMD;  Location: [facility];  Service: Oral Surgery;;   ROBOT ASSISTED MYOMECTOMY  03/03/2011   Procedure: ROBOTIC ASSISTED MYOMECTOMY;  Surgeon: Nena DELENA App, MD;  Location: [facility];  Service: Gynecology;  Laterality: N/A;   TOOTH EXTRACTION N/A 07/05/2022   Procedure: DENTAL RESTORATION/EXTRACTIONS;  Surgeon: Sheryle Hamilton, DMD;  Location: [facility];  Service: Oral Surgery;  Laterality: N/A;   TUBAL LIGATION      OB History     Gravida  3   Para  2   Term  2   Preterm      AB  1   Living  2      SAB  1   IAB      Ectopic      Multiple      Live Births  2            Home Medications    Prior to Admission medications   Medication Sig Start Date End Date Taking? Authorizing Provider  budesonide -formoterol  (SYMBICORT ) 80-4.5 MCG/ACT inhaler Inhale 2 puffs into the lungs in the morning and at bedtime. 09/11/23  Yes Octaviano Mukai, Rocky POUR, PA-C  predniSONE  (STERAPRED UNI-PAK 21 TAB) 10 MG (21) TBPK tablet As directed 09/11/23  Yes Aleea Hendry K, PA-C  albuterol  (VENTOLIN  HFA) 108 (90 Base)  MCG/ACT inhaler Inhale 1-2 puffs into the lungs every 6 (six) hours as needed for wheezing or shortness of breath. 04/19/23   Reddick, Johnathan B, NP  DULERA 100-5 MCG/ACT AERO Inhale 2 puffs into the lungs every 6 (six) hours as needed for wheezing.  08/14/15 06/27/19  [provider]  fluticasone  (FLONASE ) 50 MCG/ACT nasal spray Place 2 sprays into both nostrils daily. 05/04/19 06/27/19  Alva Larraine FALCON, PA-C    Family History Family History  Problem Relation Age of Onset   Diabetes Mother    Hypertension Mother    Thyroid  disease Father    Heart disease Father    Hyperlipidemia Father    Cancer Maternal Grandmother     Social History Social History   Tobacco Use   Smoking status: Never   Smokeless tobacco: Never  Vaping Use   Vaping status: Never Used  Substance Use Topics   Alcohol use: No   Drug use: No     Allergies   Patient has no  known allergies.   Review of Systems Review of Systems  Constitutional:  Positive for activity change. Negative for appetite change, fatigue and fever.  HENT:  Negative for congestion, sinus pressure, sneezing and sore throat.   Respiratory:  Positive for cough, shortness of breath and wheezing. Negative for chest tightness.   Cardiovascular:  Negative for chest pain.  Gastrointestinal:  Negative for abdominal pain, diarrhea, nausea and vomiting.     Physical Exam Triage Vital Signs ED Triage Vitals  Encounter Vitals Group     BP 09/11/23 1027 117/73     Girls Systolic BP Percentile --      Girls Diastolic BP Percentile --      Boys Systolic BP Percentile --      Boys Diastolic BP Percentile --      Pulse Rate 09/11/23 1027 97     Resp 09/11/23 1027 17     Temp 09/11/23 1027 98.4 F (36.9 C)     Temp Source 09/11/23 1027 Oral     SpO2 09/11/23 1027 95 %     Weight 09/11/23 1025 202 lb (91.6 kg)     Height 09/11/23 1025 5' (1.524 m)     Head Circumference --      Peak Flow --      Pain Score 09/11/23 1025 0     Pain Loc --      Pain Education --      Exclude from Growth Chart --    No data found.  Updated Vital Signs BP 117/73 (BP Location: Left Arm)   Pulse 82   Temp 98.4 F (36.9 C) (Oral)   Resp 17   Ht 5' (1.524 m)   Wt 202 lb (91.6 kg)   LMP 08/09/2023   SpO2 98%   BMI 39.45 kg/m   Visual Acuity Right Eye Distance:   Left Eye Distance:   Bilateral Distance:    Right Eye Near:   Left Eye Near:    Bilateral Near:     Physical Exam Vitals reviewed.  Constitutional:      General: She is awake. She is not in acute distress.    Appearance: Normal appearance. She is well-developed. She is not ill-appearing.     Comments: Very pleasant female appears stated age in no acute distress sitting comfortably in exam room  HENT:     Head: Normocephalic and atraumatic.     Right Ear: Ear canal and external ear normal. There is impacted  cerumen. Tympanic  membrane is not erythematous or bulging.     Left Ear: Tympanic membrane, ear canal and external ear normal. Tympanic membrane is not erythematous or bulging.     Nose:     Right Sinus: No maxillary sinus tenderness or frontal sinus tenderness.     Left Sinus: No maxillary sinus tenderness or frontal sinus tenderness.     Mouth/Throat:     Pharynx: Uvula midline. No oropharyngeal exudate or posterior oropharyngeal erythema.  Cardiovascular:     Rate and Rhythm: Normal rate and regular rhythm.     Heart sounds: Normal heart sounds, S1 normal and S2 normal. No murmur heard. Pulmonary:     Effort: Pulmonary effort is normal.     Breath sounds: Wheezing and rhonchi present. No rales.     Comments: Widespread wheezing and rhonchi; resolution of wheezing following Solu-Medrol  and DuoNeb in clinic. Psychiatric:        Behavior: Behavior is cooperative.      UC Treatments / Results  Labs (all labs ordered are listed, but only abnormal results are displayed) Labs Reviewed - No data to display  EKG   Radiology DG Chest 2 View Result Date: 09/11/2023 CLINICAL DATA:  Worsening cough with shortness of breath and wheezing for 2 weeks. History of asthma. EXAM: CHEST - 2 VIEW COMPARISON:  Radiographs 01/29/2023 and 12/28/2022. FINDINGS: The heart size and mediastinal contours are normal. The lungs are clear. There is no pleural effusion or pneumothorax. No acute osseous findings are identified. Artifact from the patient's hair overlies the lower neck and shoulder regions. IMPRESSION: No active cardiopulmonary process. Electronically Signed   By: Elsie Perone M.D.   On: 09/11/2023 11:02    Procedures Procedures (including critical care time)  Medications Ordered in UC Medications  ipratropium-albuterol  (DUONEB) 0.5-2.5 (3) MG/3ML nebulizer solution 3 mL (3 mLs Nebulization Given 09/11/23 1101)  methylPREDNISolone  sodium succinate (SOLU-MEDROL ) 125 mg/2 mL injection 60 mg (60 mg Intramuscular  Given 09/11/23 1114)  albuterol  (VENTOLIN  HFA) 108 (90 Base) MCG/ACT inhaler 2 puff (2 puffs Inhalation Given 09/11/23 1142)    Initial Impression / Assessment and Plan / UC Course  I have reviewed the triage vital signs and the nursing notes.  Pertinent labs & imaging results that were available during my care of the patient were reviewed by me and considered in my medical decision making (see chart for details).     Patient is well-appearing, afebrile, nontoxic, nontachycardic.  She initially had significant widespread wheezing and rhonchi but this improved following a dose of 60 mg of Solu-Medrol  IM and a DuoNeb in clinic.  Chest x-ray was obtained given significant wheezing to ensure that there is not an underlying acute cardiopulmonary disorder and this was normal.  Will treat for asthma exacerbation.  She was given albuterol  inhaler to take home and encouraged to use this every 4-6 hours as needed.  Will start Symbicort  twice daily we discussed that she should rinse her mouth following use of this medication to prevent thrush.  She was given Solu-Medrol  in clinic so we will start prednisone  tomorrow (09/12/2023).  We discussed that she should not take NSAIDs with this medication due to risk of GI bleeding.  Can use Tylenol  as needed.  Recommended close follow-up with her primary care.  We discussed that if she has any worsening or changing symptoms including fever, worsening cough, shortness of breath, wheezing despite the medication she should be seen immediately.  All questions answered to patient's satisfaction.  Final  Clinical Impressions(s) / UC Diagnoses   Final diagnoses:  Subacute cough  Asthma in adult, moderate persistent, with acute exacerbation     Discharge Instructions      I am glad that you are feeling better after the medication.  Use albuterol  every 4-6 hours as needed for shortness of breath and coughing fits.  We gave an injection of steroids so would like you to start  prednisone  tomorrow (09/12/2023).  Do not take NSAIDs with this medication including aspirin, ibuprofen /Advil , naproxen /Aleve .  You can use Tylenol /acetaminophen  as needed.  Start Symbicort  twice a day.  Rinse your mouth after you use this medication to prevent thrush.  If you have any worsening symptoms including shortness of breath, persistent wheezing despite the medication, chest pain, fever, worsening cough you need to be reevaluated.  Follow-up with your PCP as soon as possible.     ED Prescriptions     Medication Sig Dispense Auth. Provider   budesonide -formoterol  (SYMBICORT ) 80-4.5 MCG/ACT inhaler Inhale 2 puffs into the lungs in the morning and at bedtime. 1 each Julia Brackin K, PA-C   predniSONE  (STERAPRED UNI-PAK 21 TAB) 10 MG (21) TBPK tablet As directed 21 tablet Julia Brickman K, PA-C      PDMP not reviewed this encounter.   Sherrell Rocky POUR, PA-C 09/11/23 1144

## 2023-09-11 NOTE — Discharge Instructions (Signed)
 I am glad that you are feeling better after the medication.  Use albuterol  every 4-6 hours as needed for shortness of breath and coughing fits.  We gave an injection of steroids so would like you to start prednisone  tomorrow (09/12/2023).  Do not take NSAIDs with this medication including aspirin, ibuprofen /Advil , naproxen /Aleve .  You can use Tylenol /acetaminophen  as needed.  Start Symbicort  twice a day.  Rinse your mouth after you use this medication to prevent thrush.  If you have any worsening symptoms including shortness of breath, persistent wheezing despite the medication, chest pain, fever, worsening cough you need to be reevaluated.  Follow-up with your PCP as soon as possible.

## 2023-09-11 NOTE — ED Triage Notes (Signed)
 Pt states that she has a cough, sob and wheezing. X2 weeks  Pt states that she has a history of asthma.

## 2023-11-29 ENCOUNTER — Other Ambulatory Visit: Payer: Self-pay

## 2023-11-29 ENCOUNTER — Ambulatory Visit
Admission: EM | Admit: 2023-11-29 | Discharge: 2023-11-29 | Disposition: A | Discharge status: Home / Self Care | Attending: Physician Assistant | Admitting: Physician Assistant

## 2023-11-29 DIAGNOSIS — N390 Urinary tract infection, site not specified: Secondary | ICD-10-CM | POA: Insufficient documentation

## 2023-11-29 DIAGNOSIS — N949 Unspecified condition associated with female genital organs and menstrual cycle: Secondary | ICD-10-CM | POA: Diagnosis not present

## 2023-11-29 DIAGNOSIS — N898 Other specified noninflammatory disorders of vagina: Secondary | ICD-10-CM | POA: Insufficient documentation

## 2023-11-29 LAB — POCT URINE PREGNANCY: Preg Test, Ur: NEGATIVE

## 2023-11-29 LAB — POCT URINE DIPSTICK
Bilirubin, UA: NEGATIVE
Blood, UA: NEGATIVE
Glucose, UA: NEGATIVE mg/dL
Ketones, POC UA: NEGATIVE mg/dL
Nitrite, UA: POSITIVE — AB
POC PROTEIN,UA: NEGATIVE
Spec Grav, UA: 1.015 (ref 1.010–1.025)
Urobilinogen, UA: 1 U/dL
pH, UA: 6.5 (ref 5.0–8.0)

## 2023-11-29 MED ORDER — FLUCONAZOLE 150 MG PO TABS: 150.0000 mg | ORAL_TABLET | ORAL | 0 refills | Status: AC | PRN

## 2023-11-29 MED ORDER — NITROFURANTOIN MONOHYD MACRO 100 MG PO CAPS: 100.0000 mg | ORAL_CAPSULE | Freq: Two times a day (BID) | ORAL | 0 refills | Status: AC

## 2023-11-29 NOTE — Discharge Instructions (Addendum)
 VISIT SUMMARY:  You came in today because you have been experiencing vaginal discharge and irritation for the past one to two weeks. The discharge is watery with an odor, and you have some soreness in the vaginal area. You do not have any pain with urination, fever, or chills.   YOUR PLAN:  -ACUTE VAGINITIS WITH VAGINAL DISCHARGE, POSSIBLE YEAST INFECTION: Acute vaginitis is an inflammation of the vagina that can cause discharge, itching, and discomfort. We suspect a yeast infection, which is a common cause of vaginitis. We have prescribed an antifungal medication to help relieve your symptoms while we wait for the swab results to confirm the diagnosis. Depending on the swab results, we may adjust your treatment if necessary.  -URINARY TRACT INFECTION: A urinary tract infection (UTI) is an infection in any part of your urinary system. Your urine results suggest a possible UTI, even though you do not have pain with urination, fever, or chills. We have prescribed Macrobid  (nitrofurantoin ) 100 mg to be taken twice daily for five days. Please stay hydrated and avoid holding in your urine for long periods. We will adjust your antibiotic treatment if needed based on the urine culture results.  INSTRUCTIONS:  Please take the prescribed medications as directed. We will contact you with the results of your swab and urine culture, and we may adjust your treatment based on those results. If you experience any worsening symptoms or new symptoms, please contact our office immediately or go to the ER     We will keep you updated on the results of your cervicovaginal swab once the results are available.  If medication or treatment is indicated by those results that will be sent into the pharmacy that we have on file. Please make sure that you are practicing safe sex and using barrier methods to prevent exposure. It is recommended to avoid intercourse until you have the results back from testing and have completed  any treatments that are sent in for you.

## 2023-11-29 NOTE — ED Provider Notes (Addendum)
 [facility]    CSN: [id number] Arrival date & time: 11/29/23  1858      History   Chief Complaint Chief Complaint  Patient presents with   Vaginal Discharge    HPI KETRA DUCHESNE is a 48 y.o. female.  has a past medical history of Allergy, Anemia, Arthritis, Asthma, Breast lesion, Bronchitis, Fibroid, trichomoniasis, and Papilloma of breast.   HPI  Discussed the use of AI scribe software for clinical note transcription with the patient, who gave verbal consent to proceed.  The patient presents with vaginal discharge and irritation.  She has been experiencing vaginal discharge and irritation for about one to two weeks. The discharge is watery with an associated odor. No bleeding, pain with urination, fever, or chills, but there is soreness in the vaginal area.  She has a history of sexually transmitted diseases but is not currently sexually active. She has not been informed by any partners about recent exposure to STDs. She has had multiple partners in the past but currently does not have any active partners.  No abdominal pain and no current sexual activity.    Past Medical History:  Diagnosis Date   Allergy    Anemia    Arthritis    Asthma    Breast lesion    Left   Bronchitis    Fibroid    Hx of trichomoniasis    Papilloma of breast    left    Patient Active Problem List   Diagnosis Date Noted   Asthma 08/30/2015   Allergy 08/30/2015   Uterine leiomyoma 03/03/2011    Past Surgical History:  Procedure Laterality Date   ALVEOLOPLASTY  07/05/2022   Procedure: ALVEOLOPLASTY;  Surgeon: Sheryle Hamilton, DMD;  Location: [facility] OR;  Service: Oral Surgery;;   BREAST LUMPECTOMY WITH RADIOACTIVE SEED LOCALIZATION Left 11/01/2016   Procedure: LEFT BREAST RADIOACTIVE SEED GUIDED LUMPECTOMY;  Surgeon: Curvin Deward MOULD, MD;  Location: [facility] OR;  Service: General;  Laterality: Left;   BUNIONECTOMY     right foot   DENTAL SURGERY     EXCISION PERITONSILLAR CYST   07/05/2022   Procedure: EXCISION PALATABLE TORES;  Surgeon: Sheryle Hamilton, DMD;  Location: [facility] OR;  Service: Oral Surgery;;   ROBOT ASSISTED MYOMECTOMY  03/03/2011   Procedure: ROBOTIC ASSISTED MYOMECTOMY;  Surgeon: Nena DELENA App, MD;  Location: [facility];  Service: Gynecology;  Laterality: N/A;   TOOTH EXTRACTION N/A 07/05/2022   Procedure: DENTAL RESTORATION/EXTRACTIONS;  Surgeon: Sheryle Hamilton, DMD;  Location: [facility] OR;  Service: Oral Surgery;  Laterality: N/A;   TUBAL LIGATION      OB History     Gravida  3   Para  2   Term  2   Preterm      AB  1   Living  2      SAB  1   IAB      Ectopic      Multiple      Live Births  2            Home Medications    Prior to Admission medications   Medication Sig Start Date End Date Taking? Authorizing Provider  fluconazole  (DIFLUCAN ) 150 MG tablet Take 1 tablet (150 mg total) by mouth every three (3) days as needed. May repeat in 3 days if symptoms not resolved 11/29/23  Yes Mattea Seger E, PA-C  nitrofurantoin , macrocrystal-monohydrate, (MACROBID ) 100 MG capsule Take 1 capsule (100 mg total) by mouth 2 (two) times daily  for 5 days. 11/29/23 12/04/23 Yes Jerimey Burridge E, PA-C  albuterol  (VENTOLIN  HFA) 108 (90 Base) MCG/ACT inhaler Inhale 1-2 puffs into the lungs every 6 (six) hours as needed for wheezing or shortness of breath. 04/19/23   Reddick, Johnathan B, NP  budesonide -formoterol  (SYMBICORT ) 80-4.5 MCG/ACT inhaler Inhale 2 puffs into the lungs in the morning and at bedtime. 09/11/23   Raspet, Chevy Virgo K, PA-C  DULERA 100-5 MCG/ACT AERO Inhale 2 puffs into the lungs every 6 (six) hours as needed for wheezing.  08/14/15 06/27/19  [provider]  fluticasone  (FLONASE ) 50 MCG/ACT nasal spray Place 2 sprays into both nostrils daily. 05/04/19 06/27/19  Alva Larraine FALCON, PA-C    Family History Family History  Problem Relation Age of Onset   Diabetes Mother    Hypertension Mother    Thyroid  disease Father    Heart disease Father     Hyperlipidemia Father    Cancer Maternal Grandmother     Social History Social History   Tobacco Use   Smoking status: Never   Smokeless tobacco: Never  Vaping Use   Vaping status: Never Used  Substance Use Topics   Alcohol use: No   Drug use: No     Allergies   Patient has no known allergies.   Review of Systems Review of Systems  Constitutional:  Negative for chills and fever.  Gastrointestinal:  Negative for abdominal pain.  Genitourinary:  Positive for vaginal discharge. Negative for dysuria, genital sores, vaginal bleeding and vaginal pain.  Skin:  Negative for rash.     Physical Exam Triage Vital Signs ED Triage Vitals  Encounter Vitals Group     BP 11/29/23 1954 118/72     Girls Systolic BP Percentile --      Girls Diastolic BP Percentile --      Boys Systolic BP Percentile --      Boys Diastolic BP Percentile --      Pulse Rate 11/29/23 1954 90     Resp 11/29/23 1954 17     Temp 11/29/23 1954 98.8 F (37.1 C)     Temp Source 11/29/23 1954 Oral     SpO2 11/29/23 1954 99 %     Weight 11/29/23 1953 190 lb (86.2 kg)     Height 11/29/23 1953 4' 9 (1.448 m)     Head Circumference --      Peak Flow --      Pain Score 11/29/23 1952 7     Pain Loc --      Pain Education --      Exclude from Growth Chart --    No data found.  Updated Vital Signs BP 118/72 (BP Location: Right Arm)   Pulse 90   Temp 98.8 F (37.1 C) (Oral)   Resp 17   Ht 4' 9 (1.448 m)   Wt 190 lb (86.2 kg)   LMP 11/16/2023 (Exact Date)   SpO2 99%   BMI 41.12 kg/m   Visual Acuity Right Eye Distance:   Left Eye Distance:   Bilateral Distance:    Right Eye Near:   Left Eye Near:    Bilateral Near:     Physical Exam Vitals reviewed.  Constitutional:      General: She is awake.     Appearance: Normal appearance. She is well-developed and well-groomed.  HENT:     Head: Normocephalic and atraumatic.  Eyes:     General: Lids are normal. Gaze aligned appropriately.      Extraocular  Movements: Extraocular movements intact.     Conjunctiva/sclera: Conjunctivae normal.  Pulmonary:     Effort: Pulmonary effort is normal.  Neurological:     Mental Status: She is alert and oriented to person, place, and time.  Psychiatric:        Attention and Perception: Attention and perception normal.        Mood and Affect: Mood and affect normal.        Speech: Speech normal.        Behavior: Behavior normal. Behavior is cooperative.        Thought Content: Thought content normal.        Judgment: Judgment normal.      UC Treatments / Results  Labs (all labs ordered are listed, but only abnormal results are displayed) Labs Reviewed  POCT URINE DIPSTICK - Abnormal; Notable for the following components:      Result Value   Clarity, UA cloudy (*)    Nitrite, UA Positive (*)    Leukocytes, UA Moderate (2+) (*)    All other components within normal limits  URINE CULTURE  POCT URINE PREGNANCY  CERVICOVAGINAL ANCILLARY ONLY    EKG   Radiology No results found.  Procedures Procedures (including critical care time)  Medications Ordered in UC Medications - No data to display  Initial Impression / Assessment and Plan / UC Course  I have reviewed the triage vital signs and the nursing notes.  Pertinent labs & imaging results that were available during my care of the patient were reviewed by me and considered in my medical decision making (see chart for details).      Final Clinical Impressions(s) / UC Diagnoses   Final diagnoses:  Vaginal discomfort  Vaginal discharge  Urinary tract infection without hematuria, site unspecified   Acute vaginitis with vaginal discharge, possible yeast infection Vaginal discharge with watery consistency and odor for about a week, associated with itching and discomfort, suggesting a possible yeast infection. Awaiting swab results to confirm diagnosis. - Prescribe Diflucan   to provide relief while awaiting swab  results. - Send swab for analysis to confirm diagnosis. - Adjust treatment based on swab results if necessary.  Urinary tract infection Urine results suspicious for a urinary tract infection. No dysuria, fever, or chills. Awaiting urine culture results to confirm diagnosis. - Prescribe Macrobid  (nitrofurantoin ) 100 mg to be taken twice daily for five days. - Advise to maintain hydration and avoid prolonged urine retention. - Adjust antibiotic treatment based on urine culture results if necessary.    Discharge Instructions      VISIT SUMMARY:  You came in today because you have been experiencing vaginal discharge and irritation for the past one to two weeks. The discharge is watery with an odor, and you have some soreness in the vaginal area. You do not have any pain with urination, fever, or chills.   YOUR PLAN:  -ACUTE VAGINITIS WITH VAGINAL DISCHARGE, POSSIBLE YEAST INFECTION: Acute vaginitis is an inflammation of the vagina that can cause discharge, itching, and discomfort. We suspect a yeast infection, which is a common cause of vaginitis. We have prescribed an antifungal medication to help relieve your symptoms while we wait for the swab results to confirm the diagnosis. Depending on the swab results, we may adjust your treatment if necessary.  -URINARY TRACT INFECTION: A urinary tract infection (UTI) is an infection in any part of your urinary system. Your urine results suggest a possible UTI, even though you do not have pain with  urination, fever, or chills. We have prescribed Macrobid  (nitrofurantoin ) 100 mg to be taken twice daily for five days. Please stay hydrated and avoid holding in your urine for long periods. We will adjust your antibiotic treatment if needed based on the urine culture results.  INSTRUCTIONS:  Please take the prescribed medications as directed. We will contact you with the results of your swab and urine culture, and we may adjust your treatment based on  those results. If you experience any worsening symptoms or new symptoms, please contact our office immediately or go to the ER     We will keep you updated on the results of your cervicovaginal swab once the results are available.  If medication or treatment is indicated by those results that will be sent into the pharmacy that we have on file. Please make sure that you are practicing safe sex and using barrier methods to prevent exposure. It is recommended to avoid intercourse until you have the results back from testing and have completed any treatments that are sent in for you.       ED Prescriptions     Medication Sig Dispense Auth. Provider   fluconazole  (DIFLUCAN ) 150 MG tablet Take 1 tablet (150 mg total) by mouth every three (3) days as needed. May repeat in 3 days if symptoms not resolved 2 tablet Larrell Rapozo E, PA-C   nitrofurantoin , macrocrystal-monohydrate, (MACROBID ) 100 MG capsule Take 1 capsule (100 mg total) by mouth 2 (two) times daily for 5 days. 10 capsule Vear Staton E, PA-C      PDMP not reviewed this encounter.   Kimorah Ridolfi, Rocky BRAVO, PA-C 11/29/23 2020    Merrell Borsuk E, PA-C 11/29/23 2020

## 2023-11-29 NOTE — ED Triage Notes (Signed)
 Pt presents with complaints of vaginal discharge and irritation x 1 week. Currently rates overall pain a 7/10 in vaginal area. No medications taken or applied PTA for symptoms reported. Denies urinary symptoms.

## 2023-11-30 LAB — CERVICOVAGINAL ANCILLARY ONLY
Bacterial Vaginitis (gardnerella): NEGATIVE
Candida Glabrata: NEGATIVE
Candida Vaginitis: NEGATIVE
Chlamydia: NEGATIVE
Comment: NEGATIVE
Comment: NEGATIVE
Comment: NEGATIVE
Comment: NEGATIVE
Comment: NEGATIVE
Comment: NORMAL
Neisseria Gonorrhea: NEGATIVE
Trichomonas: NEGATIVE

## 2023-12-02 ENCOUNTER — Ambulatory Visit (HOSPITAL_COMMUNITY): Payer: Self-pay

## 2023-12-02 LAB — URINE CULTURE: Culture: 60000 — AB

## 2023-12-02 MED ORDER — SULFAMETHOXAZOLE-TRIMETHOPRIM 800-160 MG PO TABS: 1.0000 | ORAL_TABLET | Freq: Two times a day (BID) | ORAL | 0 refills | Status: AC
